# Patient Record
Sex: Female | Born: 1960
Health system: Southern US, Community
[De-identification: ages and names within clinical notes are randomized; demographics above are authoritative.]

## PROBLEM LIST (undated history)

## (undated) DIAGNOSIS — H8110 Benign paroxysmal vertigo, unspecified ear: Secondary | ICD-10-CM

## (undated) DIAGNOSIS — E785 Hyperlipidemia, unspecified: Secondary | ICD-10-CM

## (undated) DIAGNOSIS — M545 Low back pain, unspecified: Secondary | ICD-10-CM

## (undated) DIAGNOSIS — R51 Headache: Secondary | ICD-10-CM

## (undated) DIAGNOSIS — K3532 Acute appendicitis with perforation and localized peritonitis, without abscess: Secondary | ICD-10-CM

## (undated) DIAGNOSIS — A770 Spotted fever due to Rickettsia rickettsii: Secondary | ICD-10-CM

## (undated) DIAGNOSIS — N182 Chronic kidney disease, stage 2 (mild): Secondary | ICD-10-CM

## (undated) DIAGNOSIS — G43909 Migraine, unspecified, not intractable, without status migrainosus: Secondary | ICD-10-CM

## (undated) DIAGNOSIS — F329 Major depressive disorder, single episode, unspecified: Secondary | ICD-10-CM

## (undated) DIAGNOSIS — M533 Sacrococcygeal disorders, not elsewhere classified: Secondary | ICD-10-CM

## (undated) DIAGNOSIS — G4733 Obstructive sleep apnea (adult) (pediatric): Secondary | ICD-10-CM

## (undated) DIAGNOSIS — F32A Depression, unspecified: Secondary | ICD-10-CM

## (undated) DIAGNOSIS — E119 Type 2 diabetes mellitus without complications: Secondary | ICD-10-CM

## (undated) DIAGNOSIS — G8929 Other chronic pain: Secondary | ICD-10-CM

## (undated) DIAGNOSIS — F17200 Nicotine dependence, unspecified, uncomplicated: Secondary | ICD-10-CM

## (undated) DIAGNOSIS — K7689 Other specified diseases of liver: Secondary | ICD-10-CM

## (undated) DIAGNOSIS — R519 Headache, unspecified: Secondary | ICD-10-CM

## (undated) DIAGNOSIS — Z86010 Personal history of colonic polyps: Secondary | ICD-10-CM

## (undated) HISTORY — DX: Other chronic pain: G89.29

## (undated) HISTORY — DX: Type 2 diabetes mellitus without complications: E11.9

## (undated) HISTORY — DX: Headache: R51

## (undated) HISTORY — DX: Hyperlipidemia, unspecified: E78.5

## (undated) HISTORY — DX: Low back pain, unspecified: M54.50

## (undated) HISTORY — DX: Major depressive disorder, single episode, unspecified: F32.9

## (undated) HISTORY — PX: TEMPOROMANDIBULAR JOINT SURGERY: SHX35

## (undated) HISTORY — PX: TEMPOROMANDIBULAR JOINT ARTHROPLASTY: SUR76

## (undated) HISTORY — DX: Nicotine dependence, unspecified, uncomplicated: F17.200

## (undated) HISTORY — DX: Depression, unspecified: F32.A

## (undated) HISTORY — DX: Headache, unspecified: R51.9

## (undated) HISTORY — DX: Personal history of colonic polyps: Z86.010

## (undated) HISTORY — PX: COLONOSCOPY W/ BIOPSIES: SHX1374

## (undated) HISTORY — DX: Migraine, unspecified, not intractable, without status migrainosus: G43.909

## (undated) HISTORY — DX: Chronic kidney disease, stage 2 (mild): N18.2

## (undated) HISTORY — PX: OTHER SURGICAL HISTORY: SHX169

## (undated) HISTORY — PX: TONSILLECTOMY: SUR1361

## (undated) HISTORY — DX: Spotted fever due to Rickettsia rickettsii: A77.0

## (undated) HISTORY — DX: Benign paroxysmal vertigo, unspecified ear: H81.10

---

## 1998-06-06 ENCOUNTER — Other Ambulatory Visit: Admission: RE | Admit: 1998-06-06 | Discharge: 1998-06-06 | Payer: Self-pay | Admitting: Obstetrics

## 1998-09-06 ENCOUNTER — Emergency Department (HOSPITAL_COMMUNITY): Admission: EM | Admit: 1998-09-06 | Discharge: 1998-09-06 | Payer: Self-pay | Admitting: Emergency Medicine

## 1998-09-20 ENCOUNTER — Encounter: Payer: Self-pay | Admitting: Obstetrics

## 1998-09-20 ENCOUNTER — Inpatient Hospital Stay (HOSPITAL_COMMUNITY): Admission: RE | Admit: 1998-09-20 | Discharge: 1998-09-20 | Payer: Self-pay | Admitting: Obstetrics

## 1999-06-06 ENCOUNTER — Ambulatory Visit (HOSPITAL_COMMUNITY): Admission: RE | Admit: 1999-06-06 | Discharge: 1999-06-06 | Payer: Self-pay

## 2000-05-31 ENCOUNTER — Other Ambulatory Visit: Admission: RE | Admit: 2000-05-31 | Discharge: 2000-05-31 | Payer: Self-pay | Admitting: Obstetrics

## 2000-06-10 ENCOUNTER — Encounter: Payer: Self-pay | Admitting: Obstetrics

## 2000-06-10 ENCOUNTER — Ambulatory Visit (HOSPITAL_COMMUNITY): Admission: RE | Admit: 2000-06-10 | Discharge: 2000-06-10 | Payer: Self-pay | Admitting: Obstetrics

## 2000-08-06 ENCOUNTER — Emergency Department (HOSPITAL_COMMUNITY): Admission: EM | Admit: 2000-08-06 | Discharge: 2000-08-06 | Payer: Self-pay | Admitting: Emergency Medicine

## 2002-07-24 ENCOUNTER — Other Ambulatory Visit: Admission: RE | Admit: 2002-07-24 | Discharge: 2002-07-24 | Payer: Self-pay | Admitting: Family Medicine

## 2004-01-25 ENCOUNTER — Encounter: Admission: RE | Admit: 2004-01-25 | Discharge: 2004-01-25 | Payer: Self-pay | Admitting: Family Medicine

## 2007-11-10 HISTORY — PX: LAPAROSCOPIC APPENDECTOMY: SUR753

## 2008-10-30 ENCOUNTER — Inpatient Hospital Stay (HOSPITAL_COMMUNITY): Admission: EM | Admit: 2008-10-30 | Discharge: 2008-11-01 | Payer: Self-pay | Admitting: Emergency Medicine

## 2008-10-30 ENCOUNTER — Encounter: Admission: RE | Admit: 2008-10-30 | Discharge: 2008-10-30 | Payer: Self-pay | Admitting: Family Medicine

## 2008-10-30 ENCOUNTER — Encounter (INDEPENDENT_AMBULATORY_CARE_PROVIDER_SITE_OTHER): Payer: Self-pay

## 2008-11-08 ENCOUNTER — Ambulatory Visit (HOSPITAL_COMMUNITY): Admission: RE | Admit: 2008-11-08 | Discharge: 2008-11-08 | Payer: Self-pay | Admitting: Family Medicine

## 2008-11-08 ENCOUNTER — Encounter: Admission: RE | Admit: 2008-11-08 | Discharge: 2008-11-08 | Payer: Self-pay | Admitting: Family Medicine

## 2008-11-09 HISTORY — PX: ABDOMINAL HYSTERECTOMY: SHX81

## 2008-12-04 ENCOUNTER — Encounter: Admission: RE | Admit: 2008-12-04 | Discharge: 2008-12-04 | Payer: Self-pay | Admitting: Surgery

## 2008-12-11 ENCOUNTER — Observation Stay (HOSPITAL_COMMUNITY): Admission: AD | Admit: 2008-12-11 | Discharge: 2008-12-12 | Payer: Self-pay | Admitting: Specialist

## 2008-12-26 ENCOUNTER — Encounter (INDEPENDENT_AMBULATORY_CARE_PROVIDER_SITE_OTHER): Payer: Self-pay | Admitting: Obstetrics and Gynecology

## 2008-12-26 ENCOUNTER — Inpatient Hospital Stay (HOSPITAL_COMMUNITY): Admission: RE | Admit: 2008-12-26 | Discharge: 2008-12-28 | Payer: Self-pay | Admitting: Obstetrics and Gynecology

## 2009-10-26 ENCOUNTER — Encounter (INDEPENDENT_AMBULATORY_CARE_PROVIDER_SITE_OTHER): Payer: Self-pay | Admitting: *Deleted

## 2009-10-26 ENCOUNTER — Emergency Department (HOSPITAL_COMMUNITY): Admission: EM | Admit: 2009-10-26 | Discharge: 2009-10-26 | Payer: Self-pay | Admitting: Emergency Medicine

## 2009-10-30 ENCOUNTER — Encounter (INDEPENDENT_AMBULATORY_CARE_PROVIDER_SITE_OTHER): Payer: Self-pay | Admitting: *Deleted

## 2009-10-30 ENCOUNTER — Encounter: Admission: RE | Admit: 2009-10-30 | Discharge: 2009-10-30 | Payer: Self-pay | Admitting: Surgery

## 2009-11-09 HISTORY — PX: ESOPHAGOGASTRODUODENOSCOPY: SHX1529

## 2009-11-26 ENCOUNTER — Ambulatory Visit: Payer: Self-pay | Admitting: Gastroenterology

## 2009-11-26 DIAGNOSIS — K589 Irritable bowel syndrome without diarrhea: Secondary | ICD-10-CM | POA: Insufficient documentation

## 2009-11-26 DIAGNOSIS — R112 Nausea with vomiting, unspecified: Secondary | ICD-10-CM | POA: Insufficient documentation

## 2009-11-26 LAB — CONVERTED CEMR LAB: Tissue Transglutaminase Ab, IgA: 0.6 units (ref <7)

## 2009-11-27 LAB — CONVERTED CEMR LAB
ALT: 56 units/L — ABNORMAL HIGH (ref 0–35)
AST: 39 units/L — ABNORMAL HIGH (ref 0–37)
Albumin: 4.3 g/dL (ref 3.5–5.2)
Alkaline Phosphatase: 72 units/L (ref 39–117)
BUN: 11 mg/dL (ref 6–23)
Basophils Absolute: 0 10*3/uL (ref 0.0–0.1)
Basophils Relative: 0.3 % (ref 0.0–3.0)
CO2: 29 meq/L (ref 19–32)
Calcium: 10.2 mg/dL (ref 8.4–10.5)
Chloride: 105 meq/L (ref 96–112)
Creatinine, Ser: 0.9 mg/dL (ref 0.4–1.2)
Eosinophils Absolute: 0.2 10*3/uL (ref 0.0–0.7)
Eosinophils Relative: 1.7 % (ref 0.0–5.0)
Ferritin: 137.5 ng/mL (ref 10.0–291.0)
GFR calc non Af Amer: 70.49 mL/min (ref >60)
Glucose, Bld: 100 mg/dL — ABNORMAL HIGH (ref 70–99)
HCT: 41.1 % (ref 36.0–46.0)
Hemoglobin: 13.6 g/dL (ref 12.0–15.0)
IgA: 206 mg/dL (ref 68–378)
Iron: 94 ug/dL (ref 42–145)
Lymphocytes Relative: 30.5 % (ref 12.0–46.0)
Lymphs Abs: 2.8 10*3/uL (ref 0.7–4.0)
MCHC: 33.1 g/dL (ref 30.0–36.0)
MCV: 97.3 fL (ref 78.0–100.0)
Monocytes Absolute: 0.5 10*3/uL (ref 0.1–1.0)
Monocytes Relative: 5.5 % (ref 3.0–12.0)
Neutro Abs: 5.6 10*3/uL (ref 1.4–7.7)
Neutrophils Relative %: 62 % (ref 43.0–77.0)
Platelets: 261 10*3/uL (ref 150.0–400.0)
Potassium: 4.6 meq/L (ref 3.5–5.1)
RBC: 4.22 M/uL (ref 3.87–5.11)
RDW: 12.8 % (ref 11.5–14.6)
Saturation Ratios: 24.7 % (ref 20.0–50.0)
Sed Rate: 41 mm/hr — ABNORMAL HIGH (ref 0–22)
Sodium: 139 meq/L (ref 135–145)
TSH: 0.85 microintl units/mL (ref 0.35–5.50)
Total Bilirubin: 0.6 mg/dL (ref 0.3–1.2)
Total Protein: 7.6 g/dL (ref 6.0–8.3)
Transferrin: 272.2 mg/dL (ref 212.0–360.0)
WBC: 9.1 10*3/uL (ref 4.5–10.5)

## 2009-12-03 ENCOUNTER — Telehealth: Payer: Self-pay | Admitting: Gastroenterology

## 2009-12-05 ENCOUNTER — Ambulatory Visit: Payer: Self-pay | Admitting: Gastroenterology

## 2009-12-05 ENCOUNTER — Ambulatory Visit (HOSPITAL_COMMUNITY): Admission: RE | Admit: 2009-12-05 | Discharge: 2009-12-05 | Payer: Self-pay | Admitting: Gastroenterology

## 2009-12-05 ENCOUNTER — Telehealth (INDEPENDENT_AMBULATORY_CARE_PROVIDER_SITE_OTHER): Payer: Self-pay | Admitting: *Deleted

## 2009-12-09 ENCOUNTER — Ambulatory Visit: Payer: Self-pay | Admitting: Gastroenterology

## 2009-12-10 ENCOUNTER — Encounter: Payer: Self-pay | Admitting: Gastroenterology

## 2009-12-10 LAB — CONVERTED CEMR LAB
ALT: 47 units/L — ABNORMAL HIGH (ref 0–35)
AST: 33 units/L (ref 0–37)
Albumin: 4 g/dL (ref 3.5–5.2)
Alkaline Phosphatase: 63 units/L (ref 39–117)
BUN: 13 mg/dL (ref 6–23)
Basophils Absolute: 0 10*3/uL (ref 0.0–0.1)
Basophils Relative: 0.3 % (ref 0.0–3.0)
CO2: 29 meq/L (ref 19–32)
Calcium: 9.4 mg/dL (ref 8.4–10.5)
Chloride: 103 meq/L (ref 96–112)
Creatinine, Ser: 0.9 mg/dL (ref 0.4–1.2)
Eosinophils Absolute: 0.1 10*3/uL (ref 0.0–0.7)
Eosinophils Relative: 1.9 % (ref 0.0–5.0)
Ferritin: 109.8 ng/mL (ref 10.0–291.0)
GFR calc non Af Amer: 70.48 mL/min (ref >60)
Glucose, Bld: 171 mg/dL — ABNORMAL HIGH (ref 70–99)
HCT: 39.6 % (ref 36.0–46.0)
Hemoglobin: 13.7 g/dL (ref 12.0–15.0)
INR: 1 (ref 0.8–1.0)
IgA: 218 mg/dL (ref 68–378)
Iron: 101 ug/dL (ref 42–145)
Lymphocytes Relative: 41.4 % (ref 12.0–46.0)
Lymphs Abs: 3.1 10*3/uL (ref 0.7–4.0)
MCHC: 34.7 g/dL (ref 30.0–36.0)
MCV: 95.3 fL (ref 78.0–100.0)
Monocytes Absolute: 0.3 10*3/uL (ref 0.1–1.0)
Monocytes Relative: 3.5 % (ref 3.0–12.0)
Neutro Abs: 4 10*3/uL (ref 1.4–7.7)
Neutrophils Relative %: 52.9 % (ref 43.0–77.0)
Platelets: 237 10*3/uL (ref 150.0–400.0)
Potassium: 3.9 meq/L (ref 3.5–5.1)
Prothrombin Time: 10.2 s (ref 9.1–11.7)
RBC: 4.15 M/uL (ref 3.87–5.11)
RDW: 12.2 % (ref 11.5–14.6)
Saturation Ratios: 26.9 % (ref 20.0–50.0)
Sodium: 139 meq/L (ref 135–145)
Total Bilirubin: 0.2 mg/dL — ABNORMAL LOW (ref 0.3–1.2)
Total Protein: 7.2 g/dL (ref 6.0–8.3)
Transferrin: 268.1 mg/dL (ref 212.0–360.0)
WBC: 7.5 10*3/uL (ref 4.5–10.5)

## 2009-12-18 LAB — CONVERTED CEMR LAB
A-1 Antitrypsin, Ser: 128 mg/dL (ref 83–200)
Anti Nuclear Antibody(ANA): NEGATIVE
Ceruloplasmin: 38 mg/dL (ref 21–63)
HCV Ab: NEGATIVE
Hep A IgM: NEGATIVE
Hep A Total Ab: NEGATIVE
Hep B S Ab: NEGATIVE
Hepatitis B Surface Ag: NEGATIVE
Tissue Transglutaminase Ab, IgA: 0.5 units (ref <7)

## 2010-03-19 ENCOUNTER — Telehealth: Payer: Self-pay | Admitting: Gastroenterology

## 2010-03-21 ENCOUNTER — Ambulatory Visit: Payer: Self-pay | Admitting: Gastroenterology

## 2010-03-21 DIAGNOSIS — K7581 Nonalcoholic steatohepatitis (NASH): Secondary | ICD-10-CM | POA: Insufficient documentation

## 2010-03-21 DIAGNOSIS — K7689 Other specified diseases of liver: Secondary | ICD-10-CM

## 2010-03-21 HISTORY — DX: Other specified diseases of liver: K76.89

## 2010-03-24 ENCOUNTER — Telehealth (INDEPENDENT_AMBULATORY_CARE_PROVIDER_SITE_OTHER): Payer: Self-pay | Admitting: *Deleted

## 2010-04-14 ENCOUNTER — Telehealth (INDEPENDENT_AMBULATORY_CARE_PROVIDER_SITE_OTHER): Payer: Self-pay | Admitting: *Deleted

## 2010-07-18 ENCOUNTER — Encounter: Admission: RE | Admit: 2010-07-18 | Discharge: 2010-07-18 | Payer: Self-pay | Admitting: Family Medicine

## 2010-11-30 ENCOUNTER — Encounter: Payer: Self-pay | Admitting: Family Medicine

## 2010-11-30 ENCOUNTER — Encounter: Payer: Self-pay | Admitting: Gastroenterology

## 2010-12-11 NOTE — Progress Notes (Signed)
Summary: CX CT  Phone Note Call from Patient   Caller: Patient Summary of Call: Pt calling to cx the CT scan she had scheduled because her insurance deductible is $5000.00.  She says she feels fine today and will call if she has any further problems.  [facility] is aware pt called them and cx Initial call taken by: Christian Mate CMA Deborra Medina),  Mar 24, 2010 9:58 AM  Follow-up for Phone Call        ok Follow-up by: Milus Banister MD,  Mar 24, 2010 10:17 AM

## 2010-12-11 NOTE — Procedures (Signed)
Summary: Upper Endoscopy  Patient: [name] Note: All result statuses are Final unless otherwise noted.  Tests: (1) Upper Endoscopy (EGD)   EGD Upper Endoscopy       DONE     [facility]     [place], [place]  [address]           ENDOSCOPY PROCEDURE REPORT           PATIENT:  Monique, Cannon  MR#:  [id number]     BIRTHDATE:  May 12, 1961, 48 yrs. old  GENDER:  female     ENDOSCOPIST:  Milus Banister, MD     Referred by:  Michael Boston, M.D.     PROCEDURE DATE:  12/05/2009     PROCEDURE:  EGD with biopsy     ASA CLASS:  Class II     INDICATIONS:  nausea, vomitting     MEDICATIONS:  MAC sedation, administered by CRNA     TOPICAL ANESTHETIC:  none     DESCRIPTION OF PROCEDURE:   After the risks benefits and     alternatives of the procedure were thoroughly explained, informed     consent was obtained.  The  endoscope was introduced through the     mouth and advanced to the second portion of the duodenum, without     limitations.  The instrument was slowly withdrawn as the mucosa     was fully examined.     <<PROCEDUREIMAGES>>     There was mild, non-specific gastritis and bulbar duodenitis. The     distal stomach was biopsied to check for H. pylori (see image2 and     image3).  Otherwise the examination was normal (see image4,     image5, and image6).    Retroflexed views revealed no     abnormalities.    The scope was then withdrawn from the patient     and the procedure completed.           COMPLICATIONS:  None           ENDOSCOPIC IMPRESSION:     1) Mild gastritis and duodenitis, biopsies taken to check for H.     pylori     2) Otherwise normal examination           RECOMMENDATIONS:     If biopsies show H. pylori, she will be started on the     appropriate antibiotics.     If biopsies are negative, then my office will arrange HIDA scan     (she has nausea, perhaps from biliary dyskenesia given otherwise     negative workup).        ______________________________     Milus Banister, MD           n.     eSIGNED:   Milus Banister at 12/05/2009 09:11 AM           Thivierge, Sharyn Lull, [phone]  Note: An exclamation mark (!) indicates a result that was not dispersed into the flowsheet. Document Creation Date: 12/05/2009 9:12 AM _______________________________________________________________________  (1) Order result status: Final Collection or observation date-time: 12/05/2009 08:47 Requested date-time:  Receipt date-time:  Reported date-time:  Referring Physician:   Ordering Physician: Owens Loffler [phone]) Specimen Source:  Source: Tawanna Cooler Order Number: [id number] Lab site:

## 2010-12-11 NOTE — Progress Notes (Signed)
Summary: Labs  Phone Note Outgoing Call Call back at [facility] Phone [phone]   Call placed by: Christian Mate CMA Deborra Medina),  December 05, 2009 9:32 AM Summary of Call: call pt tomorrow and have her come in for labs Initial call taken by: Christian Mate CMA Deborra Medina),  December 05, 2009 9:33 AM  Follow-up for Phone Call        pt aware will have labs done Follow-up by: Christian Mate CMA Deborra Medina),  December 06, 2009 10:18 AM

## 2010-12-11 NOTE — Assessment & Plan Note (Signed)
History of Present Illness Visit Type: Initial Consult Primary GI MD: Owens Loffler MD Primary Provider: n/a Requesting Provider: Michael Boston, MD Chief Complaint: left sided  abdominal pain, n&v History of Present Illness:     who was sent by Dr. Johney Maine.  One month ago she was told she had gallbladder issues, went to Dr. Johney Maine.  She had appendectomy one year ago in [place] (10/2008).  Was in post operative pain, CT done and was told she had a cyst in the uterous.  She lap hysterectomy in Feb 2010, was told she had a lot of adhesions and her bladder was abnormal.    She has gained 15 pounds in past 2 months.  She has left lower quadrant, this constant "10" per patient without pain medicines. This pain was going UP her back, saw a chirapracter, had accupunture  (mid december) and this signifcantly  helped the left sided pain.  The accupuncture would help for about a week.    She has been also vomitting daily for about 2 months unless she has antinausea medicines.    Nausea not clearly related to eating.  lately taking 3-4 oxycodone a day for the past week, accupuncture helped prior to that.  she was in the emergency room last month. CT Scan abdomen and pelvis was essentially normal except for some extra stool in the rectosigmoid region. No clear obstruction. She had lab tests as well within normal CBC, normal complete metabolic profile except for slightly elevated ALT.           Current Medications (verified): 1)  Oxycodone-Acetaminophen 5-325 Mg Tabs (Oxycodone-Acetaminophen) .... Take 1 Tablet Every 6 Hours As Needed  Allergies (verified): 1)  ! Augmentin  Past History:  Past Medical History: uterine fibroids  hypotension gallstones pneumonia  Past Surgical History: Appendectomy 2009 Diagnostic lap 2010; Hysterectomy; Bilateral Salpingo-oophorectomy  Family History: no colon cancer  Social History: she is married, she has no children, she smokes cigarettes currently,  she does not drink alcohol, she drinks 3 caffeinated beverages a day  Review of Systems       Pertinent positive and negative review of systems were noted in the above HPI and GI specific review of systems.  All other review of systems was otherwise negative.   Vital Signs:  Patient profile:   50 year old female Height:      65 inches Weight:      164.50 pounds BMI:     27.47 Pulse rate:   72 / minute Pulse rhythm:   regular BP sitting:   102 / 66  (left arm) Cuff size:   regular  Vitals Entered By: June McMurray Morris Deborra Medina) (November 26, 2009 10:54 AM)  Physical Exam  Additional Exam:  Constitutional: generally well appearing Psychiatric: alert and oriented times 3 Eyes: extraocular movements intact Mouth: oropharynx moist, no lesions Neck: supple, no lymphadenopathy Cardiovascular: heart regular rate and rythm Lungs: CTA bilaterally Abdomen: soft, non-tender, non-distended, no obvious ascites, no peritoneal signs, normal bowel sounds Extremities: no lower extremity edema bilaterally Skin: no lesions on visible extremities back examination: nontender throughout spinal column  throughout the visit she was in tears, fairly emotional.    Impression & Recommendations:  Problem # 1:  left flank, left back, left hip pains he did not seem that she has left sided abdominal pains. Her pains are really more in the flank, back, left hip region. She is very clear that acupuncture completely improved her pains. She did have somewhat excessive  stool burden on a CT scan done last month and so perhaps constipation is playing a role here. She has not felt constipated until she began taking narcotic pain medicines last week. I recommended she continue taking MiraLax twice a day.  explained to her that I did not think that her pains were likely related to her GI tract that I would probably not be able to help her with these pains.  Problem # 2:  constipation, routine risk for colon cancer we  will arrange for colonoscopy to be done at the same time as her upcoming EGD  Problem # 3:  nausea, vomiting nearly daily no obvious cause. She does have gallstones however except for nausea she really has no symptoms could be attributed to the gallstones and is not clear that the gallstones are causing her nausea either. Think she needs EGD at her soonest convenience. The endoscopic procedure should be done with propofol at [facility].   She will also get a set of labs including a CBC, complete metabolic profile, thyroid testing, sedimentation rate, celiac sprue testing.  Other Orders: TLB-CMP (Comprehensive Metabolic Pnl) (96759-FMBW) TLB-CBC Platelet - w/Differential (85025-CBCD) TLB-TSH (Thyroid Stimulating Hormone) (84443-TSH) TLB-Sedimentation Rate (ESR) (85652-ESR) TLB-IgA (Immunoglobulin A) (82784-IGA) T-Tissue Transglutamase Ab IgA (46659-93570) TLB-Iron, (Fe) Total (83540-FE) TLB-IBC Pnl (Iron/FE;Transferrin) (83550-IBC) TLB-Ferritin (82728-FER)  Patient Instructions: 1)  You will be scheduled to have an upper endoscopy. 2)  You will be scheduled to have a colonoscopy. 3)  These will be done with anethesia assistence, propofol. 4)  Will call in prescription for phenergan suppository, take as needed for nausea, vomitting. 5)  A copy of this information will be sent to Dr. Johney Maine. 6)  You will get lab test(s) done today (cmet, cbc, tsh, esr, tTg, total IgA, iron level, TIBC, ferritin). 7)  You should continue taking miralax twice a day. 8)  The medication list was reviewed and reconciled.  All changed / newly prescribed medications were explained.  A complete medication list was provided to the patient / caregiver. Prescriptions: PROMETHAZINE HCL 25 MG  SUPP (PROMETHAZINE HCL) take one rectall as needed, once to twice a day  #60 x 2   Entered and Authorized by:   Milus Banister MD   Signed by:   Christian Mate CMA ([place]) on 11/26/2009   Method used:   [facility]          [facility] [address]       [address]       [place], [place]  [address]       Ph: [phone] or [phone]       Fax: [phone]   RxID:   [id number]   Appended Document: Orders UpdateMovi    Clinical Lists Changes  Problems: Added new problem of NAUSEA AND VOMITING (ICD-787.01) Orders: Added new Test order of [facility] - Signed      Appended Document: movi    Clinical Lists Changes  Medications: Added new medication of MOVIPREP 100 GM  SOLR (PEG-KCL-NACL-NASULF-NA ASC-C) As per prep instructions. - Signed Rx of MOVIPREP 100 GM  SOLR (PEG-KCL-NACL-NASULF-NA ASC-C) As per prep instructions.;  #1 x 0;  Signed;  Entered by: Christian Mate CMA (AAMA);  Authorized by: Milus Banister MD;  Method used: [facility]  [id number] [address], [place], [place]  [address], Ph: [phone] or [phone], Fax: [phone]    Prescriptions: MOVIPREP 100 GM  SOLR (PEG-KCL-NACL-NASULF-NA ASC-C) As per prep instructions.  #1 x 0   Entered by:   Christian Mate CMA ([place])   Authorized by:   Milus Banister MD   Signed by:   Christian Mate CMA ([place]) on 11/26/2009   Method used:   [facility]        [facility]  [id number]       [address]       [place], [place]  [address]       Ph: [phone] or [phone]       Fax: [phone]   RxID:   [id number]

## 2010-12-11 NOTE — Progress Notes (Signed)
Summary: Hosp procedure-lost instructions  Phone Note Call from Patient Call back at [facility] Phone [phone]   Call For: Dr [name] Summary of Call: Lost her prep instructions for a procedure this thursday Can we Fax them to her at [phone] ? Initial call taken by: Irwin Brakeman [facility],  December 03, 2009 12:49 PM  Follow-up for Phone Call        instructions faxed to the above number.  pt aware Follow-up by: Christian Mate CMA Deborra Medina),  December 03, 2009 1:06 PM

## 2010-12-11 NOTE — Letter (Signed)
Summary: [facility] Instructions  [facility]  [place], [place] [address]   Phone: [phone]  Fax: [phone]       [name]    1961/01/22    MRN: [id number]        Procedure Day /Date:12/05/09     Arrival Time:630 am     Procedure Time:830 am     Location of Procedure:                     X  [facility] ( Outpatient Registration)                        [place]   Starting 5 days prior to your procedure 11/30/09 do not eat nuts, seeds, popcorn, corn, beans, peas,  salads, or any raw vegetables.  Do not take any fiber supplements (e.g. Metamucil, Citrucel, and Benefiber).  THE DAY BEFORE YOUR PROCEDURE         DATE: 12/04/09 DAY: WED  1.  Drink clear liquids the entire day-NO SOLID FOOD  2.  Do not drink anything colored red or purple.  Avoid juices with pulp.  No orange juice.  3.  Drink at least 64 oz. (8 glasses) of fluid/clear liquids during the day to prevent dehydration and help the prep work efficiently.  CLEAR LIQUIDS INCLUDE: Water Jello Ice Popsicles Tea (sugar ok, no milk/cream) Powdered fruit flavored drinks Coffee (sugar ok, no milk/cream) Gatorade Juice: apple, white grape, white cranberry  Lemonade Clear bullion, consomm, broth Carbonated beverages (any kind) Strained chicken noodle soup Hard Candy                             4.  In the morning, mix first dose of MoviPrep solution:    Empty 1 Pouch A and 1 Pouch B into the disposable container    Add lukewarm drinking water to the top line of the container. Mix to dissolve    Refrigerate (mixed solution should be used within 24 hrs)  5.  Begin drinking the prep at 5:00 p.m. The MoviPrep container is divided by 4 marks.   Every 15 minutes drink the solution down to the next mark (approximately 8 oz) until the full liter is complete.   6.  Follow completed prep with 16 oz of clear liquid of your choice (Nothing red or  purple).  Continue to drink clear liquids until bedtime.  7.  Before going to bed, mix second dose of MoviPrep solution:    Empty 1 Pouch A and 1 Pouch B into the disposable container    Add lukewarm drinking water to the top line of the container. Mix to dissolve    Refrigerate  THE DAY OF YOUR PROCEDURE      DATE: 12/05/09 JKD:TOIZT  Beginning at 330 a.m. (5 hours before procedure):         1. Every 15 minutes, drink the solution down to the next mark (approx 8 oz) until the full liter is complete.  2. Follow completed prep with 16 oz. of clear liquid of your choice.    3. You may drink clear liquids until 430 am (4 HOURS BEFORE PROCEDURE).   MEDICATION INSTRUCTIONS  Unless otherwise instructed, you should take regular prescription medications with a small sip of water   as early as possible the morning of your procedure.  OTHER INSTRUCTIONS  You will need a responsible adult at least 51 years of age to accompany you and drive you home.   This person must remain in the waiting room during your procedure.  Wear loose fitting clothing that is easily removed.  Leave jewelry and other valuables at home.  However, you may wish to bring a book to read or  an iPod/MP3 player to listen to music as you wait for your procedure to start.  Remove all body piercing jewelry and leave at home.  Total time from sign-in until discharge is approximately 2-3 hours.  You should go home directly after your procedure and rest.  You can resume normal activities the  day after your procedure.  The day of your procedure you should not:   Drive   Make legal decisions   Operate machinery   Drink alcohol   Return to work  You will receive specific instructions about eating, activities and medications before you leave.    The above instructions have been reviewed and explained to me by   _______________________    I fully understand and can verbalize these  instructions _____________________________ Date _________

## 2010-12-11 NOTE — Procedures (Signed)
Summary: Instructions for procedure/[facility] (out pt)  Instructions for procedure/[facility] (out pt)   Imported By: Phillis Knack 12/03/2009 11:44:50  _____________________________________________________________________  External Attachment:    Type:   Image     Comment:   External Document

## 2010-12-11 NOTE — Progress Notes (Signed)
Summary: lab reminder  Phone Note Outgoing Call Call back at [facility] Phone [phone]   Call placed by: Christian Mate CMA Deborra Medina),  April 14, 2010 4:04 PM Summary of Call: pt called and reminded to have labs Initial call taken by: Christian Mate CMA Deborra Medina),  April 14, 2010 4:04 PM

## 2010-12-11 NOTE — Assessment & Plan Note (Signed)
  Review of gastrointestinal problems: 1. Ruptured appendicitis, 2010. 2. Elevated liver tests, mildly elevated transaminases (noted winter 2010,11): Lab tests 2011: hepatitis A., B., C. all negative. Ceruloplasmin, alpha-1 antitrypsin, ANA, AMA, iron studies all normal. Imaging including CT and ultrasound had suggested fatty liver however no signs of cirrhosis.    History of Present Illness Visit Type: Follow-up Visit Primary GI MD: Owens Loffler MD Primary Provider: [facility] Requesting Provider: n/a Chief Complaint: follow up liver test History of Present Illness:      liver disease run in her family.  hemochromatosis, liver cancer, cirrhosis.  she continues to have left back, left flank pains.  these are of unclear etiology. She has seen a Restaurant manager, fast food and acupuncturist for them. She has not seen back surgeon or a spine doctor however.  She's had new RLQ pains for past 3 weeks.  Had pretty severe distention at the beginning of it, she felt some rip in RLQ.  NO fevers or chills.           Current Medications (verified): 1)  None  Allergies (verified): 1)  ! Augmentin  Vital Signs:  Patient profile:   50 year old female Height:      65 inches Weight:      173 pounds BMI:     28.89 BSA:     1.86 Pulse rate:   68 / minute Pulse rhythm:   regular BP sitting:   104 / 64  (left arm)  Vitals Entered By: Genella Mech CMA Deborra Medina) (Mar 21, 2010 8:39 AM)  Physical Exam  Additional Exam:  Constitutional: generally well appearing Psychiatric: alert and oriented times 3 Abdomen: soft, Mild to moderately tender in the right lower quadrant, non-distended, normal bowel sounds    Impression & Recommendations:  Problem # 1:  right lower quadrant pain she had a ruptured appendicitis about a year ago. She has had about 3 weeks of right lower quadrant pain. She is fairly tender to the exam on the right lower quadrant. She has had no fevers or chills. We will set  her up with a repeat CT scan abdomen and pelvis. Further recommendations pending those findings.  Problem # 2:  intermittent abdominal distention she is known to have adhesions, disease could be intermittent small bowel obstructions. Let's see what the CT scan shows.  Problem # 3:  abnormal weight gain she has gained 30-40 pounds in 6 months. I detect no extra fluid in her abdomen or her legs. He explained to her that she does not have cirrhosis on imaging or lab tests. I do think that her weight gain may be responsible for some of her abdominal, back complaints. She would not accept the fact that she may be too much for her body at this point.  Problem # 4:  left flank, left back pain I recommended she contact her primary care physician and be referred to a back doctor about this  Other Orders: CT Abdomen/Pelvis with Contrast (CT Abd/Pelvis w/con)  Patient Instructions: 1)  You will be scheduled for a CT scan of abdomen and pelvis with IV and oral contrast. 2)  You have fatty liver disease based on ultrasounds, liver tests, best treatment for this is gradual weight loss. 3)  The medication list was reviewed and reconciled.  All changed / newly prescribed medications were explained.  A complete medication list was provided to the patient / caregiver.

## 2010-12-11 NOTE — Letter (Signed)
Summary: Appt Reminder [facility]  [place], [place] [address]   Phone: [phone]  Fax: [phone]        December 05, 2009 MRN: [id number]    [name], [name]    Dear [name],   You have a return appointment with Dr. [name] on 12/30/09 at 3:30 pm.  Please remember to bring a complete list of the medicines you are taking, your insurance card and your co-pay.  If you have to cancel or reschedule this appointment, please call before 5:00 pm the evening before to avoid a cancellation fee.  If you have any questions or concerns, please call [phone].    Sincerely,    Christian Mate CMA ([place])  Appended Document: Appt Reminder 2 letter mailed

## 2010-12-11 NOTE — Letter (Signed)
Summary: Results Letter  [facility]  [place], [place] [address]   Phone: [phone]  Fax: [phone]        December 10, 2009 MRN: [id number]    [name] [place], [place]  [address]    Dear [name],   At least one of the polyps removed during your recent procedure was proven to be adenomatous.  These are pre-cancerous polyps that may have grown into cancers if they had not been removed.  Based on current nationally recognized surveillance guidelines, I recommend that you have a repeat colonoscopy in 5 years.  We will therefore put your information in our reminder system and will contact you in 5 years to schedule a repeat procedure.   The biopsies taken during the EGD were normal.  You should continue to follow the recommnedations that we discussed at the time of your procedure.  Please feel free to call if you have any further questions or concerns.        Sincerely,  Milus Banister MD  This letter has been electronically signed by your physician.  Appended Document: Results Letter letter mailed

## 2010-12-11 NOTE — Procedures (Signed)
Summary: Colonoscopy    COLONOSCOPY PROCEDURE REPORT PATIENT:  [name], [name]  MR#:  #[id number] BIRTHDATE:   1961-01-21, 48 yrs. old   GENDER:   female ENDOSCOPIST:   Milus Banister, MD Referred by: Michael Boston, M.D. PROCEDURE DATE:  12/05/2009 PROCEDURE:  Colonoscopy with snare polypectomy ASA CLASS:   Class II INDICATIONS: constipation, left sided abd (flank, back) pains MEDICATIONS:    MAC sedation, administered by CRNA DESCRIPTION OF PROCEDURE:   After the risks benefits and alternatives of the procedure were thoroughly explained, informed consent was obtained.  Digital rectal exam was performed and revealed no rectal masses.   The  endoscope was introduced through the anus and advanced to the terminal ileum which was intubated for a short distance, without limitations.  The quality of the prep was good, using MoviPrep.  The instrument was then slowly withdrawn as the colon was fully examined. <<PROCEDUREIMAGES>>        <<OLD IMAGES>> FINDINGS:  A diminutive polyp was found in the descending colon. This was sessile, 70m across, removed with cold snare and sent to pathology (see image5 and image6).  The terminal ileum appeared normal (see image3).  This was otherwise a normal examination of the colon (see image7 and image1).   Retroflexed views in the rectum revealed no abnormalities.    The scope was then withdrawn from the patient and the procedure completed. COMPLICATIONS:   None  ENDOSCOPIC IMPRESSION:  1) Diminutive polyp in the descending colon, removed and sent to pathology  2) Normal terminal ileum  3) Otherwise normal examination  RECOMMENDATIONS:  1) If the polyp(s) removed today are proven to be adenomatous (pre-cancerous) polyps, you will need a repeat colonoscopy in 5 years. Otherwise you should continue to follow colorectal cancer screening guidelines for "routine risk" patients with colonoscopy in 10 years. 2) Unclear etiology of her left sided pains.  These seem  more to be in flank, left back and not clearly GI related.  Perhaps constipation is playing somewhat of a role and so she will stay on miralax powder, two capfuls a day. 3) My office will call to arrange for further lab workup for elevated liver tests (transaminases both slightly elevated) as well as return office visit in 4-5 weeks.   _______________________________ DMilus Banister MD   Appended Document: Colonoscopy patty, she needs 1. Hepatitis A (IgM and IgG), Hepatitis B surface antigen, Hepatitis B surface antibody, Hepatitis C antibody, total iron, ferritin, TIBC, ANA, AMA, anti smooth muscle antibody, alpha 1 antitrypsin, cerulloplasm, TTG, total IgA level, CBC, CMET, INR. 2. ROV with me in 4-5 weeks  Appended Document: Colonoscopy labs in IDX and ROV made letter mailed  Appended Document: Colonoscopy/recall    Clinical Lists Changes  Observations: Added new observation of COLONNXTDUE: 11/2014 (12/10/2009 13:54)

## 2010-12-11 NOTE — Progress Notes (Signed)
Summary: Triage  Phone Note Call from Patient Call back at Home Phone [phone] Call back at Cell  [phone]   Caller: Patient Call For: Dr. [name] Reason for Call: Talk to Nurse Summary of Call: Pt said she is bloated and right lower side pain. Pt said she cannot wait until her appt. on 04-09-10 Initial call taken by: Webb Laws,  Mar 19, 2010 4:56 PM  Follow-up for Phone Call        left message on cell to call back [facility] Deborra Medina)  Mar 20, 2010 9:06 AM   unable to leave message on home number. Christian Mate CMA ([place])  Mar 20, 2010 9:08 AM   PT RETURNED CALL AND WAS OFFERED APPT FOR 03/21/10.  SHE ACCEPTED AND WILL CALL IF SHE CANT MAKE IT. Follow-up by: Christian Mate CMA Deborra Medina),  Mar 20, 2010 10:08 AM

## 2011-02-09 LAB — COMPREHENSIVE METABOLIC PANEL
ALT: 45 U/L — ABNORMAL HIGH (ref 0–35)
AST: 25 U/L (ref 0–37)
Albumin: 4 g/dL (ref 3.5–5.2)
Alkaline Phosphatase: 66 U/L (ref 39–117)
BUN: 13 mg/dL (ref 6–23)
CO2: 27 mEq/L (ref 19–32)
Calcium: 9.3 mg/dL (ref 8.4–10.5)
Chloride: 105 mEq/L (ref 96–112)
Creatinine, Ser: 0.78 mg/dL (ref 0.4–1.2)
GFR calc Af Amer: >60 mL/min (ref >60)
GFR calc non Af Amer: >60 mL/min (ref >60)
Glucose, Bld: 97 mg/dL (ref 70–99)
Potassium: 4.1 mEq/L (ref 3.5–5.1)
Sodium: 139 mEq/L (ref 135–145)
Total Bilirubin: 0.4 mg/dL (ref 0.3–1.2)
Total Protein: 7.1 g/dL (ref 6.0–8.3)

## 2011-02-09 LAB — DIFFERENTIAL
Basophils Absolute: 0.1 10*3/uL (ref 0.0–0.1)
Basophils Relative: 1 % (ref 0–1)
Eosinophils Absolute: 0.2 10*3/uL (ref 0.0–0.7)
Eosinophils Relative: 3 % (ref 0–5)
Lymphocytes Relative: 41 % (ref 12–46)
Lymphs Abs: 3.5 10*3/uL (ref 0.7–4.0)
Monocytes Absolute: 0.5 10*3/uL (ref 0.1–1.0)
Monocytes Relative: 5 % (ref 3–12)
Neutro Abs: 4.4 10*3/uL (ref 1.7–7.7)
Neutrophils Relative %: 51 % (ref 43–77)

## 2011-02-09 LAB — CBC
HCT: 40.4 % (ref 36.0–46.0)
Hemoglobin: 13.7 g/dL (ref 12.0–15.0)
MCHC: 34 g/dL (ref 30.0–36.0)
MCV: 93.8 fL (ref 78.0–100.0)
Platelets: 247 10*3/uL (ref 150–400)
RBC: 4.3 MIL/uL (ref 3.87–5.11)
RDW: 13.1 % (ref 11.5–15.5)
WBC: 8.6 10*3/uL (ref 4.0–10.5)

## 2011-02-09 LAB — URINALYSIS, ROUTINE W REFLEX MICROSCOPIC
Bilirubin Urine: NEGATIVE
Glucose, UA: NEGATIVE mg/dL
Hgb urine dipstick: NEGATIVE
Ketones, ur: NEGATIVE mg/dL
Nitrite: NEGATIVE
Protein, ur: NEGATIVE mg/dL
Specific Gravity, Urine: 1.021 (ref 1.005–1.030)
Urobilinogen, UA: 0.2 mg/dL (ref 0.0–1.0)
pH: 5.5 (ref 5.0–8.0)

## 2011-02-09 LAB — LIPASE, BLOOD: Lipase: 27 U/L (ref 11–59)

## 2011-02-24 LAB — CBC
HCT: 39.4 % (ref 36.0–46.0)
HCT: 32.5 % — ABNORMAL LOW (ref 36.0–46.0)
HCT: 41 % (ref 36.0–46.0)
Hemoglobin: 13.3 g/dL (ref 12.0–15.0)
Hemoglobin: 11.1 g/dL — ABNORMAL LOW (ref 12.0–15.0)
Hemoglobin: 13.9 g/dL (ref 12.0–15.0)
MCHC: 33.9 g/dL (ref 30.0–36.0)
MCHC: 34 g/dL (ref 30.0–36.0)
MCHC: 34.1 g/dL (ref 30.0–36.0)
MCV: 93.8 fL (ref 78.0–100.0)
MCV: 93.3 fL (ref 78.0–100.0)
MCV: 94.8 fL (ref 78.0–100.0)
Platelets: 310 10*3/uL (ref 150–400)
Platelets: 307 10*3/uL (ref 150–400)
Platelets: 377 10*3/uL (ref 150–400)
RBC: 4.2 MIL/uL (ref 3.87–5.11)
RBC: 3.42 MIL/uL — ABNORMAL LOW (ref 3.87–5.11)
RBC: 4.4 MIL/uL (ref 3.87–5.11)
RDW: 12.8 % (ref 11.5–15.5)
RDW: 12.9 % (ref 11.5–15.5)
RDW: 13.1 % (ref 11.5–15.5)
WBC: 13.4 10*3/uL — ABNORMAL HIGH (ref 4.0–10.5)
WBC: 11.4 10*3/uL — ABNORMAL HIGH (ref 4.0–10.5)
WBC: 16.2 10*3/uL — ABNORMAL HIGH (ref 4.0–10.5)

## 2011-02-24 LAB — DIFFERENTIAL
Basophils Absolute: 0 10*3/uL (ref 0.0–0.1)
Basophils Relative: 0 % (ref 0–1)
Eosinophils Absolute: 0.3 10*3/uL (ref 0.0–0.7)
Eosinophils Relative: 2 % (ref 0–5)
Lymphocytes Relative: 24 % (ref 12–46)
Lymphs Abs: 3.2 10*3/uL (ref 0.7–4.0)
Monocytes Absolute: 0.6 10*3/uL (ref 0.1–1.0)
Monocytes Relative: 4 % (ref 3–12)
Neutro Abs: 9.3 10*3/uL — ABNORMAL HIGH (ref 1.7–7.7)
Neutrophils Relative %: 70 % (ref 43–77)

## 2011-02-24 LAB — COMPREHENSIVE METABOLIC PANEL
ALT: 19 U/L (ref 0–35)
AST: 17 U/L (ref 0–37)
Albumin: 3.4 g/dL — ABNORMAL LOW (ref 3.5–5.2)
Alkaline Phosphatase: 65 U/L (ref 39–117)
BUN: 13 mg/dL (ref 6–23)
CO2: 29 mEq/L (ref 19–32)
Calcium: 9.1 mg/dL (ref 8.4–10.5)
Chloride: 101 mEq/L (ref 96–112)
Creatinine, Ser: 0.85 mg/dL (ref 0.4–1.2)
GFR calc Af Amer: >60 mL/min (ref >60)
GFR calc non Af Amer: >60 mL/min (ref >60)
Glucose, Bld: 100 mg/dL — ABNORMAL HIGH (ref 70–99)
Potassium: 4.5 mEq/L (ref 3.5–5.1)
Sodium: 136 mEq/L (ref 135–145)
Total Bilirubin: 0.4 mg/dL (ref 0.3–1.2)
Total Protein: 6.9 g/dL (ref 6.0–8.3)

## 2011-02-24 LAB — HCG, SERUM, QUALITATIVE: Preg, Serum: NEGATIVE

## 2011-03-24 NOTE — Op Note (Signed)
[name], [name]               ACCOUNT NO.:  [id number]   MEDICAL RECORD NO.:  [id number]          PATIENT TYPE:  INP   LOCATION:  9312                          FACILITY:  [facility]   PHYSICIAN:  Darlyn Chamber, M.D.   DATE OF BIRTH:  1961/05/15   DATE OF PROCEDURE:  12/26/2008  DATE OF DISCHARGE:                               OPERATIVE REPORT   PREOPERATIVE DIAGNOSIS:  Symptomatic uterine fibroids.   POSTOPERATIVE DIAGNOSES:  Symptomatic uterine fibroids with extensive  pelvic adhesions.   OPERATIVE PROCEDURE:  Attempted laparoscopy followed by exploratory  laparotomy with total abdominal hysterectomy and bilateral salpingo-  oophorectomy.  Cystoscopy.   SURGEON:  Darlyn Chamber, MD   ASSISTANT:  Ralene Bathe. Matthew Saras, MD   ANESTHESIA:  General endotracheal.   ESTIMATED BLOOD LOSS:  400 mL.   PACKS AND DRAINS:  None.   INTRAOPERATIVE BLOOD PLACED:  None.   COMPLICATIONS:  None.   INDICATIONS:  Dictated in the history and physical.   PROCEDURE IN DETAIL:  The patient was taken to the OR and placed in  supine position.  After satisfactory level of general endotracheal  anesthesia was obtained, the patient was placed in the dorsal supine  position using the Allen stirrups.  The abdomen, perineum, and vagina  were prepped out with antiseptic solution.  The bladder was emptied by  catheterization.  A Hulka tenaculum was put in place and secured.  The  patient then draped sterile field.  Subumbilical incision was made with  knife and carried through subcutaneous tissue.  The fascia entered  sharply and incision fashioned laterally.  We were unable to enter  through the peritoneum.  We were little concern that there were possible  omental adhesions up around the umbilicus.  This certainly was  thickened.  In view of this, we deferred laparoscopy and decided to  proceed with exploratory laparotomy.  Subumbilical fascia was closed  with figure-of-eight of 0 Vicryl and skin with  interrupted subcuticular  4-0 Vicryl.  Foley was placed to straight drain.  The Hulka tenaculum  was then removed.  The patient's legs were repositioned.  Low transverse  skin incision was made with knife and carried through subcutaneous  tissue.  Anterior rectus fascia was entered sharply and the incision in  the fascia was extended laterally.  Fascia taken off the muscles  superiorly and inferiorly.  Rectus muscles were separated in the  midline.  Peritoneum was entered.  Incision of peritoneum extended both  superiorly and inferiorly.  Palpation revealed a smooth liver.  There  were no periumbilical adhesions noted, so evidently were preperitoneal.  However, uterus was enlarged with fibroid.  There were adhesions to the  bladder flap and posteriorly.  An O'Connor-O'Sullivan retractor was put  in place.  The uterus was elevated through the incision.  The left round  ligament was clamped, cut, and suture ligated with 0 Vicryl.  The left  utero-ovarian pedicle was isolated, clamped, cut, and ligated with a  free tie of 0 Vicryl.  Left uterine vessels were skeletonized, clamped,  cut, and suture ligated with 0 Vicryl.  We went to the right side, the  right round ligament was clamped, cut, and suture ligated with 0 Vicryl.  The right utero-ovarian pedicle was developed, clamped, cut, and ligated  with a free tie of 0 Vicryl.  Bladder flap was then developed.  Right  uterine vessels were clamped, cut, and suture ligated with 0 Vicryl.  Using the clamp, cut, and tie technique with suture ligature of 0  Vicryl, the parametrium was separated from sides of the uterus down to  the lower uterine segment. At this point in time, the uterine fundus was  excised off the lower cervical stump and passed off.  It was sent to  Pathology.  Cervical stump was then grasped.  Using again clamped, cut,  and tie technique with suture ligature of 0 Vicryl, the parametrium was  separated from sides of lower  uterine stump.  Vaginal angles were  clamped and cut.  Intervening vaginal mucosa was excised and cervical  stump was passed off the operative field and sent to Pathology, held  pedicles, was secured with suture ligature of 0 Vicryl.  The intervening  vaginal mucosa was closed with interrupted figure-of-eights of 0 Vicryl.  Some oozing was noted from the left side at the vaginal angles.  This  was clamped and suture ligated with 0 Vicryl with good hemostasis.  At  this point in time, we went back to the ovaries.  First the left ovary  was elevated.  It was on a nice stalk well above the pelvic sidewall.  The ovarian vasculature was clamped, cut, and the left tube and ovary  were passed off the operative field and sent to Pathology.  The vascular  stump was secured with a free tie of 0 Vicryl with suture ligature of 0  Vicryl.  We then went to the right side.  Right ovary was elevated.  Again, it was on a long stalk away from the pelvic sidewall.  The  ovarian vascular was clamped and cut.  The tube and ovary were passed  off the operative field.  Pedicles secured with a free tie of 0 Vicryl  and suture ligature of 0 Vicryl.  We then irrigated the pelvis.  We had  fairly good hemostasis, some oozing from the vaginal cuff brought under  control with the Bovie.  Again, we irrigated and had good hemostasis.  The O'Connor-O'Sullivan retractor and all packs were removed.  Peritoneum was closed with a running suture of 3-0 Vicryl, fascia was  closed with a running suture of 0 PDS, and the skin was closed with  staples.   Foley was removed.  A cystoscope was put in place.  The patient had been  given indigo carmine.  Visualization of bladder on the right side, there  was a roughened and thickened area that I took a picture of.  This may  just be inflammatory changes from what has been going on.  We have the  urologist followup on this.  We did identify both ureteral orifices.  They did spilled  blue-tinged urine.  No other bladder pathology was  noted.  Cystoscope was removed.  Foley was placed back to straight  drain.  The patient was taken out of dorsal lithotomy position.  Once  alert and extubated, transferred to recovery room in good condition.  Sponge, instrument, and needle count  reported as correct by circulating  nurse x2.      Darlyn Chamber, M.D.  Electronically Signed     JSM/MEDQ  D:  12/26/2008  T:  12/26/2008  Job:  [id number]

## 2011-03-24 NOTE — Discharge Summary (Signed)
[name], [name]               ACCOUNT NO.:  [id number]   MEDICAL RECORD NO.:  [id number]          PATIENT TYPE:  INP   LOCATION:  9312                          FACILITY:  [facility]   PHYSICIAN:  Darlyn Chamber, M.D.   DATE OF BIRTH:  06-20-1961   DATE OF ADMISSION:  12/26/2008  DATE OF DISCHARGE:  12/28/2008                               DISCHARGE SUMMARY   ADMITTING DIAGNOSIS:  Symptomatic uterine fibroids.   DISCHARGE DIAGNOSIS:  Symptomatic uterine fibroids.   OPERATIVE PROCEDURE:  Diagnostic laparoscopy, total abdominal  hysterectomy, and bilateral salpingo-oophorectomy.  For complete history  and physical, please see dictated note.   HOSPITAL COURSE:  The patient underwent above-noted surgery.  Postop did  well.  She was discharged home on her second postop day.  At that time,  she was afebrile, stable vital signs.  She was tolerating her diet  fairly well.  Was ambulating without difficulty.  Abdomen was soft.  Bowel sounds were active.  She had not passed flatus.  Incisions were  clear.  She was having no active vaginal bleeding, was voiding without  difficulty.  Postop hemoglobin was less 11.1.   In terms of complication and account stay in the hospital, the patient  discharged home in stable condition.   DISPOSITION:  Routine postop instructions were given.  She is to avoid  heavy lifting, vaginal entry, or driving a car.  She is to watch for  signs of infection.  Call if increasing nausea or vomiting.  Any active  bleeding should be reported.  Increasing abdominal pain, she also be  reported.  I instructed in the signs of deep venous thrombosis or  pulmonary embolus.  Discharged home on Tylox for pain.   PLAN:  Follow up in the office on Monday to check incision and remove  staples.      Darlyn Chamber, M.D.  Electronically Signed     JSM/MEDQ  D:  12/28/2008  T:  12/28/2008  Job:  [id number]

## 2011-03-24 NOTE — Op Note (Signed)
[name], [name]               ACCOUNT NO.:  [id number]   MEDICAL RECORD NO.:  [id number]          PATIENT TYPE:  INP   LOCATION:  5125                         FACILITY:  [facility]   PHYSICIAN:  Merri Ray. Grandville Silos, M.D.DATE OF BIRTH:  [birth year]   DATE OF PROCEDURE:  DATE OF DISCHARGE:                               OPERATIVE REPORT   PREOPERATIVE DIAGNOSIS:  Acute appendicitis.   POSTOPERATIVE DIAGNOSIS:  Ruptured appendicitis.   PROCEDURE:  Laparoscopic appendectomy.   PRIMARY SURGEON:  Merri Ray. Grandville Silos, M.D.   HISTORY OF PRESENT ILLNESS:  [name] is a 50 year old female who  presented to the emergency department with right lower quadrant  abdominal pain.  History, physical exam, and CT scan of the abdomen and  pelvis were consistent with acute appendicitis.  She was brought  emergently to the operating room.   PROCEDURE IN DETAIL:  Informed consent was obtained.  The patient  received intravenous antibiotics.  She was identified in the  preoperative holding area.  She was brought to the operating room.  General anesthesia was administered by the anesthesia staff.  Her  abdomen was prepped and draped in a sterile fashion.  The time-out  procedure was done.  An area inferior to her umbilicus was infiltrated  with 0.25% Marcaine with epinephrine.  An infraumbilical incision was  made.  Subcutaneous tissues were dissected down revealing anterior  fascia.  This was divided sharply along the midline.  Peritoneal cavity  was entered under direct vision.  A 0 Vicryl purse string suture was  placed around the fascial opening and the Hasson trocar was inserted  into the abdomen.  The abdomen was insufflated with carbon dioxide in a  standard fashion.  A 12-mm left lower quadrant port and a 5-mm right  upper quadrant port were placed.  A 0.25% Marcaine was used in all port  sites.  Laparoscopic exploration revealed a perforated acute  appendicitis with significant purulent fluid and  fibrinous exudate in  the right lower quadrant.  The appendix was gently dissected free from  the terminal ileum which had stuck to it.  As part of this inflammatory  process, there was a perforation along the mid portion of the appendix  and lot of purulent fluid in the pelvis.  The fluid was irrigated out.  The appendix base was then dissected out and divided with a GI-75  stapler with vascular load.  The mesoappendix was then divided partially  with the harmonic scalpel and then remainder of it was divided with  endoscopic GIA stapler with vascular load achieving excellent  hemostasis.  The abdomen was then copiously irrigated with 3 L of warm  saline.  She did have a large uterine fibroid that was visualized.  This  was also seen on CT scan.   The irrigation continued until irrigation fluid returned clear.  There  is no further purulent collection.  The staple lines were rechecked and  remained intact with no bleeding.  Remainder of the irrigation fluid was  evacuated.  Please note the appendix after its removal was placed in an  EndoCatch bag  and removed from the left lower quadrant port site.  Much  remainder of irrigation fluid was evacuated.  Ports were removed under  direct vision and pneumoperitoneum was released.  The infraumbilical  fascia was closed at that time with 0 Vicryl purse string suture with  care not to  trap any intraabdominal content.  All 3 wounds were copiously irrigated.  The skin was closed then with 4-0 Vicryl followed by Dermabond.  Sponge,  needle, and instrument counts were correct.  The patient tolerated the  procedure well without apparent complication and was taken to recovery  room in stable condition.      Merri Ray Grandville Silos, M.D.  Electronically Signed     BET/MEDQ  D:  10/30/2008  T:  10/31/2008  Job:  [id number]   cc:   Esperanza Richters, MD

## 2011-03-24 NOTE — H&P (Signed)
NAMERHEALYNN, [name]               ACCOUNT NO.:  [id number]   MEDICAL RECORD NO.:  [id number]          PATIENT TYPE:  INP   LOCATION:  5125                         FACILITY:  [facility]   PHYSICIAN:  Merri Ray. Grandville Silos, M.D.DATE OF BIRTH:  October 06, 1961   DATE OF ADMISSION:  10/30/2008  DATE OF DISCHARGE:                              HISTORY & PHYSICAL   CHIEF COMPLAINT:  Right lower quadrant abdominal pain.   HISTORY OF PRESENT ILLNESS:  The patient is a 50 year old white female  who developed dry heaves overnight last night.  This morning, she  developed some right-sided abdominal pain that gradually localized to  the right lower quadrant.  She was seen by Dr. Tawni Millers at [facility] and sent for CT scan of the abdomen and pelvis at [facility].  This was done and was consistent with acute  appendicitis, so I am asked to evaluate for further treatment.  She  continues to complain of right lower quadrant abdominal pain.  The  nausea has passed.   PAST MEDICAL HISTORY:  Negative.   PAST SURGICAL HISTORY:  Jaw surgery and tonsillectomy.   SOCIAL HISTORY:  She smokes cigarettes, but she does not drink alcohol  or use drugs.  She sells real estate and insurance.   ALLERGIES:  No known drug allergies.   CURRENT MEDICATIONS:  None.   REVIEW OF SYSTEMS:  GI complaints as above and are otherwise negative  except some generalized muscle soreness.   PHYSICAL EXAMINATION:  VITAL SIGNS:  Temperature 97.0, pulse 123,  respirations 18, blood pressure 97/65.  GENERAL:  She is awake and alert.  She is in mild distress and is  generally uncomfortable.  HEENT:  Pupils are equal.  Sclerae are clear.  Oral mucosa is somewhat  dry.  Face is symmetric.  NECK:  Supple, but no tenderness.  LYMPH:  No supraclavicular, cervical, or periumbilical lymphadenopathy.  PULMONARY:  Lungs are clear to auscultation with no wheezing.  CARDIAC:  Heart is regular with no murmurs.  Impulse is  palpable on the  left chest.  Distal pulses are 2+ without significant peripheral edema.  ABDOMEN:  Soft, but is tender on the right lower quadrant with voluntary  guarding.  There is no generalized peritoneal sign.  Bowel sounds are  absent.  No significant masses are palpated.  SKIN:  Warm and dry with no rashes.  NEUROLOGIC:  She is alert and oriented.  Speech is fluent.  She has no  gross focal deficits.   LABORATORY STUDIES:  Pending.  CT scan of the abdomen and pelvis  demonstrates acute appendicitis without evidence of rupture.  She also  has an 8.8 cm uterine fibroid and some sclerotic changes in her pelvic  films.   IMPRESSION:  Acute appendicitis.   PLAN:  We will take her to the operating room for a laparoscopic  appendectomy.  Procedure risks and benefits were discussed in detail  with the patient and her husband.  Questions were answered.  She was  agreeable.      Merri Ray Grandville Silos, M.D.  Electronically Signed  BET/MEDQ  D:  10/30/2008  T:  10/31/2008  Job:  [id number]   cc:   Esperanza Richters, MD

## 2011-03-24 NOTE — H&P (Signed)
[name], KEEPERS NO.:  [id number]   MEDICAL RECORD NO.:  [id number]          PATIENT TYPE:  AMB   LOCATION:  SDC                           FACILITY:  [facility]   PHYSICIAN:  Darlyn Chamber, M.D.   DATE OF BIRTH:  1961-10-16   DATE OF ADMISSION:  12/26/2008  DATE OF DISCHARGE:                              HISTORY & PHYSICAL   The patient is a 50 year old nulligravida female presents for  laparoscopic-assisted vaginal hysterectomy and bilateral salpingo-  oophorectomy.   In relation to present admission, the patient had a laparoscopic  appendectomy done in December of last year.  After the procedure, she  continued to have significant suprapubic pain discomfort.  A CT had  revealed a fundal fibroid and a __________ lucency that they thought  represented abscess, but we felt more like it was degeneration.  She  continued to have pain and discomfort.  She had required hospitalization  for pain management and has been injury with numerous antibiotics since  remains on narcotics at home.  Because of continued pain discomfort, we  are going to proceed with laparoscopic-assisted vaginal hysterectomy for  degenerating painful fibroids.  She does wish to have ovaries removed in  view of her age.   In terms of allergies, she is allergic to AUGMENTIN, BEE STINGS, and  SHELLFISH.   MEDICATIONS:  Tylox as needed for pain.  She has completed the course of  ciprofloxacin.   PAST MEDICAL HISTORY:  She has had usual childhood diseases.  No  significant sequelae.  She does have gallstones and history of urinary  tract infections as well as migraine headaches.   PAST SURGICAL HISTORY:  She has had her appendix removed and had a  previous jaw surgery.   SOCIAL HISTORY:  Does feel to have one-and-half pack per day.  No  tobacco use.  No alcohol use.   FAMILY HISTORY:  Mother is with history of diabetes.  Father and  brothers with a history of hypertension.  She had a family  history of  both throat and stomach cancer in grandparents.  Father with a history  of heart disease.   REVIEW OF SYSTEMS:  Noncontributory.   PHYSICAL EXAMINATION:  GENERAL:  The patient is afebrile.  VITAL SIGNS:  Stable.  HEENT:  The patient is normocephalic.  Pupils are equal, round, and  reactive to light and accommodation.  Extraocular movements were intact.  Sclerae and conjunctivae are clear.  Oropharynx is clear.  NECK:  Without thyromegaly.  BREASTS:  Not examined.  LUNGS:  Clear.  CARDIAC:  Regular rate.  No murmurs or gallops.  ABDOMEN:  Does feel suprapubic tenderness.  No mass or organomegaly.  Bowel sounds are active.  No peritoneal signs.  PELVIC:  Normal external genitalia.  Vaginal mucosa is clear.  Cervix is  unremarkable.  Uterus is enlarged with fullness to the right side  consistent with fibroid.  Adnexa are unremarkable.  EXTREMITIES:  Trace edema.  NEUROLOGIC:  Grossly within normal limits.   IMPRESSION:  Symptomatic uterine fibroid.   PLAN:  The patient will undergo laparoscopic-assisted  vaginal  hysterectomy and bilateral salpingo-oophorectomy.  The risks of surgery  have been discussed including the risk of infection.  The risk of  hemorrhage that could require transfusion with the risk of AIDS or  hepatitis.  Risk of injury to adjacent organs including bladder, bowel,  or ureters, that could require further exploratory surgery.  Risk of  deep venous thrombosis and pulmonary embolus.  We also discussed the  potential need for exploratory surgery if we cannot get it out  laparoscopically, she is undergoing a bowel prep.      Darlyn Chamber, M.D.  Electronically Signed     JSM/MEDQ  D:  12/25/2008  T:  12/26/2008  Job:  [id number]

## 2011-03-24 NOTE — H&P (Signed)
NAMESTEPHANY, [name] NO.:  [id number]   MEDICAL RECORD NO.:  [id number]          PATIENT TYPE:  INP   LOCATION:                                FACILITY:  [facility]   PHYSICIAN:  Darlyn Chamber, M.D.   DATE OF BIRTH:  [birth year]   DATE OF ADMISSION:  12/11/2008  DATE OF DISCHARGE:                              HISTORY & PHYSICAL   A 50 year old nulligravida female is admitted for pain management.   In relation to present admission, the patient has known uterine  fibroids.  She has been having increasing pain and discomfort from  apparent degeneration of the fibroid.  She has been taking Anaprox, was  doing fairly well.  Subsequently over the last 2-3 days, she has had  increasing nausea and unable to tolerate the Anaprox.  She came in the  office, was noted to be significantly uncomfortable.  We decided to  bring her in for pain management.   ALLERGIES:  She is allergic to AUGMENTIN, BEE STINGS, and SHELLFISH.   PAST MEDICAL HISTORY:  Usual childhood diseases without any significant  sequelae.  Does have a history of migraine headaches as well as urinary  tract infections.  She also has gallstones at the present time.   PAST SURGERIES:  She had her appendix removed in October 30, 2008, and  she has had previous jaw surgery.   FAMILY HISTORY:  Noncontributory.   SOCIAL HISTORY:  She does do 1-1/2-pack per day tobacco use.  No alcohol  use.   REVIEW OF SYSTEMS:  Noncontributory.   PHYSICAL EXAMINATION:  VITAL SIGNS:  The patient is afebrile, stable  vital signs.  LUNGS:  Clear.  CARDIAC SYSTEM:  Regular rate without murmurs or gallops.  ABDOMEN:  She does have diffuse lower abdominal tenderness with no real  peritoneal signs.  Bowel sounds are active.  PELVIC:  Uterus is moderately enlarged with known uterine fibroids,  mildly tender.  Adnexa unremarkable.  EXTREMITIES:  Trace edema.  NEUROLOGIC:  Grossly within normal limits.   IMPRESSION:  Increasing pain  from apparent degenerating fibroid.   PLAN:  At the present time, the patient will be brought in for pain  management.  Routine labs will be obtained.  We will see how she  responds.      Darlyn Chamber, M.D.  Electronically Signed     JSM/MEDQ  D:  12/11/2008  T:  12/12/2008  Job:  [id number]

## 2011-03-24 NOTE — Discharge Summary (Signed)
[name], [name]               ACCOUNT NO.:  [id number]   MEDICAL RECORD NO.:  [id number]          PATIENT TYPE:  INP   LOCATION:  9303                          FACILITY:  [facility]   PHYSICIAN:  Darlyn Chamber, M.D.   DATE OF BIRTH:  09/13/1961   DATE OF ADMISSION:  12/11/2008  DATE OF DISCHARGE:  12/12/2008                               DISCHARGE SUMMARY   ADMITTING DIAGNOSIS:  Pain management for degenerating fibroid.   DISCHARGE DIAGNOSIS:  Pain management for degenerating fibroid.   For complete history and physical please see dictated history and  physical.   [place]:  The patient was brought in and began on  Dilaudid PCA.  Also, given Motrin and something for nausea.  CBC and  metabolic panel were normal.  She was observed overnight.  The following  morning she was feeling much better.  She had tolerated breakfast.  No  nausea or vomiting.  Her abdomen was softer and actually much less  tender.  Bowel sounds were active.  She is remained afebrile, stable  vital signs, and was discharged home.   IMPRESSION:  Degenerating fibroid.   PLAN:  The patient is to discharged home on Phenergan as needed for  nausea and Tylox as needed for pain.  We are going to try to arrange  hysterectomy concomitantly with gallbladder removal.  The patient is to  call with an increasing pain and discomfort or fever.      Darlyn Chamber, M.D.  Electronically Signed     JSM/MEDQ  D:  12/12/2008  T:  12/12/2008  Job:  [id number]

## 2011-08-14 LAB — CBC
HCT: 31.7 % — ABNORMAL LOW (ref 36.0–46.0)
HCT: 35.9 % — ABNORMAL LOW (ref 36.0–46.0)
HCT: 44.3 % (ref 36.0–46.0)
Hemoglobin: 10.9 g/dL — ABNORMAL LOW (ref 12.0–15.0)
Hemoglobin: 12.1 g/dL (ref 12.0–15.0)
Hemoglobin: 15 g/dL (ref 12.0–15.0)
MCHC: 33.8 g/dL (ref 30.0–36.0)
MCHC: 33.9 g/dL (ref 30.0–36.0)
MCHC: 34.3 g/dL (ref 30.0–36.0)
MCV: 94.1 fL (ref 78.0–100.0)
MCV: 95.2 fL (ref 78.0–100.0)
MCV: 95.6 fL (ref 78.0–100.0)
Platelets: 212 10*3/uL (ref 150–400)
Platelets: 215 10*3/uL (ref 150–400)
Platelets: 272 10*3/uL (ref 150–400)
RBC: 3.33 MIL/uL — ABNORMAL LOW (ref 3.87–5.11)
RBC: 3.75 MIL/uL — ABNORMAL LOW (ref 3.87–5.11)
RBC: 4.71 MIL/uL (ref 3.87–5.11)
RDW: 12.8 % (ref 11.5–15.5)
RDW: 13 % (ref 11.5–15.5)
RDW: 13.1 % (ref 11.5–15.5)
WBC: 12.9 10*3/uL — ABNORMAL HIGH (ref 4.0–10.5)
WBC: 17.8 10*3/uL — ABNORMAL HIGH (ref 4.0–10.5)
WBC: 19.7 10*3/uL — ABNORMAL HIGH (ref 4.0–10.5)

## 2011-08-14 LAB — URINALYSIS, ROUTINE W REFLEX MICROSCOPIC
Bilirubin Urine: NEGATIVE
Glucose, UA: NEGATIVE mg/dL
Hgb urine dipstick: NEGATIVE
Ketones, ur: 15 mg/dL — AB
Nitrite: NEGATIVE
Protein, ur: NEGATIVE mg/dL
Specific Gravity, Urine: 1.01 (ref 1.005–1.030)
Urobilinogen, UA: 1 mg/dL (ref 0.0–1.0)
pH: 6.5 (ref 5.0–8.0)

## 2011-08-14 LAB — COMPREHENSIVE METABOLIC PANEL
ALT: 19 U/L (ref 0–35)
AST: 22 U/L (ref 0–37)
Albumin: 4.1 g/dL (ref 3.5–5.2)
Alkaline Phosphatase: 58 U/L (ref 39–117)
BUN: 11 mg/dL (ref 6–23)
CO2: 20 mEq/L (ref 19–32)
Calcium: 9.7 mg/dL (ref 8.4–10.5)
Chloride: 100 mEq/L (ref 96–112)
Creatinine, Ser: 0.81 mg/dL (ref 0.4–1.2)
GFR calc Af Amer: >60 mL/min (ref >60)
GFR calc non Af Amer: >60 mL/min (ref >60)
Glucose, Bld: 103 mg/dL — ABNORMAL HIGH (ref 70–99)
Potassium: 4.6 mEq/L (ref 3.5–5.1)
Sodium: 132 mEq/L — ABNORMAL LOW (ref 135–145)
Total Bilirubin: 1.3 mg/dL — ABNORMAL HIGH (ref 0.3–1.2)
Total Protein: 7.2 g/dL (ref 6.0–8.3)

## 2011-08-14 LAB — URINE CULTURE
Colony Count: NO GROWTH
Culture: NO GROWTH

## 2011-08-14 LAB — DIFFERENTIAL
Basophils Absolute: 0.1 10*3/uL (ref 0.0–0.1)
Basophils Relative: 0 % (ref 0–1)
Eosinophils Absolute: 0 10*3/uL (ref 0.0–0.7)
Eosinophils Relative: 0 % (ref 0–5)
Lymphocytes Relative: 7 % — ABNORMAL LOW (ref 12–46)
Lymphs Abs: 1.3 10*3/uL (ref 0.7–4.0)
Monocytes Absolute: 0.2 10*3/uL (ref 0.1–1.0)
Monocytes Relative: 1 % — ABNORMAL LOW (ref 3–12)
Neutro Abs: 18.2 10*3/uL — ABNORMAL HIGH (ref 1.7–7.7)
Neutrophils Relative %: 92 % — ABNORMAL HIGH (ref 43–77)

## 2011-08-14 LAB — LIPASE, BLOOD: Lipase: 20 U/L (ref 11–59)

## 2011-10-19 LAB — CBC AND DIFFERENTIAL
HCT: 44 (ref 36–46)
Hemoglobin: 15.1 (ref 12.0–16.0)
Platelets: 241 (ref 150–399)
WBC: 7.5

## 2011-10-20 ENCOUNTER — Emergency Department (HOSPITAL_COMMUNITY)
Admission: EM | Admit: 2011-10-20 | Discharge: 2011-10-21 | Disposition: A | Discharge status: Home / Self Care | Payer: 59 | Attending: Emergency Medicine | Admitting: Emergency Medicine

## 2011-10-20 ENCOUNTER — Encounter: Payer: Self-pay | Admitting: *Deleted

## 2011-10-20 ENCOUNTER — Emergency Department (HOSPITAL_COMMUNITY): Payer: 59

## 2011-10-20 DIAGNOSIS — R112 Nausea with vomiting, unspecified: Secondary | ICD-10-CM | POA: Insufficient documentation

## 2011-10-20 DIAGNOSIS — R109 Unspecified abdominal pain: Secondary | ICD-10-CM | POA: Insufficient documentation

## 2011-10-20 DIAGNOSIS — J189 Pneumonia, unspecified organism: Secondary | ICD-10-CM | POA: Insufficient documentation

## 2011-10-20 DIAGNOSIS — R509 Fever, unspecified: Secondary | ICD-10-CM | POA: Insufficient documentation

## 2011-10-20 DIAGNOSIS — R05 Cough: Secondary | ICD-10-CM | POA: Insufficient documentation

## 2011-10-20 DIAGNOSIS — R059 Cough, unspecified: Secondary | ICD-10-CM | POA: Insufficient documentation

## 2011-10-20 DIAGNOSIS — E860 Dehydration: Secondary | ICD-10-CM

## 2011-10-20 MED ORDER — ONDANSETRON HCL 4 MG/2ML IJ SOLN
4.0000 mg | Freq: Once | INTRAMUSCULAR | Status: AC
  Administered 2011-10-21: 4 mg via INTRAVENOUS
  Filled 2011-10-20: qty 2

## 2011-10-20 MED ORDER — SODIUM CHLORIDE 0.9 % IV BOLUS (SEPSIS)
2000.0000 mL | Freq: Once | INTRAVENOUS | Status: AC
  Administered 2011-10-21: 1000 mL via INTRAVENOUS

## 2011-10-20 NOTE — ED Notes (Signed)
Pt states she went to [facility] yesterday. Pt states they dx her with pna. Pt states her nausea medication is not working and she feels worst

## 2011-10-21 LAB — COMPREHENSIVE METABOLIC PANEL
ALT: 58 U/L — ABNORMAL HIGH (ref 0–35)
AST: 42 U/L — ABNORMAL HIGH (ref 0–37)
Albumin: 3.8 g/dL (ref 3.5–5.2)
Alkaline Phosphatase: 72 U/L (ref 39–117)
BUN: 12 mg/dL (ref 6–23)
CO2: 22 mEq/L (ref 19–32)
Calcium: 9.2 mg/dL (ref 8.4–10.5)
Chloride: 96 mEq/L (ref 96–112)
Creatinine, Ser: 1.13 mg/dL — ABNORMAL HIGH (ref 0.50–1.10)
GFR calc Af Amer: 64 mL/min — ABNORMAL LOW (ref >90)
GFR calc non Af Amer: 55 mL/min — ABNORMAL LOW (ref >90)
Glucose, Bld: 102 mg/dL — ABNORMAL HIGH (ref 70–99)
Potassium: 3.9 mEq/L (ref 3.5–5.1)
Sodium: 130 mEq/L — ABNORMAL LOW (ref 135–145)
Total Bilirubin: 0.3 mg/dL (ref 0.3–1.2)
Total Protein: 7.9 g/dL (ref 6.0–8.3)

## 2011-10-21 LAB — URINALYSIS, ROUTINE W REFLEX MICROSCOPIC
Bilirubin Urine: NEGATIVE
Glucose, UA: NEGATIVE mg/dL
Leukocytes, UA: NEGATIVE
Nitrite: NEGATIVE
Protein, ur: NEGATIVE mg/dL
Specific Gravity, Urine: 1.014 (ref 1.005–1.030)
Urobilinogen, UA: 0.2 mg/dL (ref 0.0–1.0)
pH: 5.5 (ref 5.0–8.0)

## 2011-10-21 LAB — CBC
HCT: 38.9 % (ref 36.0–46.0)
Hemoglobin: 13.5 g/dL (ref 12.0–15.0)
MCH: 31.8 pg (ref 26.0–34.0)
MCHC: 34.7 g/dL (ref 30.0–36.0)
MCV: 91.5 fL (ref 78.0–100.0)
Platelets: 194 10*3/uL (ref 150–400)
RBC: 4.25 MIL/uL (ref 3.87–5.11)
RDW: 13.1 % (ref 11.5–15.5)
WBC: 7.8 10*3/uL (ref 4.0–10.5)

## 2011-10-21 LAB — DIFFERENTIAL
Basophils Absolute: 0 10*3/uL (ref 0.0–0.1)
Basophils Relative: 0 % (ref 0–1)
Eosinophils Absolute: 0.1 10*3/uL (ref 0.0–0.7)
Eosinophils Relative: 1 % (ref 0–5)
Lymphocytes Relative: 19 % (ref 12–46)
Lymphs Abs: 1.5 10*3/uL (ref 0.7–4.0)
Monocytes Absolute: 0.5 10*3/uL (ref 0.1–1.0)
Monocytes Relative: 6 % (ref 3–12)
Neutro Abs: 5.7 10*3/uL (ref 1.7–7.7)
Neutrophils Relative %: 74 % (ref 43–77)

## 2011-10-21 LAB — URINE MICROSCOPIC-ADD ON

## 2011-10-21 LAB — LIPASE, BLOOD: Lipase: 14 U/L (ref 11–59)

## 2011-10-21 LAB — LACTIC ACID, PLASMA: Lactic Acid, Venous: 0.9 mmol/L (ref 0.5–2.2)

## 2011-10-21 MED ORDER — SODIUM CHLORIDE 0.9 % IV BOLUS (SEPSIS)
2000.0000 mL | Freq: Once | INTRAVENOUS | Status: AC
  Administered 2011-10-21: 1000 mL via INTRAVENOUS

## 2011-10-21 MED ORDER — KETOROLAC TROMETHAMINE 30 MG/ML IJ SOLN
30.0000 mg | Freq: Once | INTRAMUSCULAR | Status: AC
  Administered 2011-10-21: 30 mg via INTRAVENOUS
  Filled 2011-10-21: qty 1

## 2011-10-21 NOTE — ED Provider Notes (Signed)
History     CSN: [id number] Arrival date & time: 10/20/2011 10:11 PM   First MD Initiated Contact with Patient 10/20/11 2324      Chief Complaint  Patient presents with  . Pneumonia  . Nausea  . Emesis    (Consider location/radiation/quality/duration/timing/severity/associated sxs/prior treatment) HPI Patient is a 50 yo female who presents for several days of cough as well as associated subjective fevers and malaise.  She was seen at an urgent care yesterday and diagnosed with pneumonia.  She was started on avelox, cough medicine, and an albuterol inhaler at that time.  She has received 2 doses of avelox.  She has also had nausea and vomiting which worsened despite antiemetic given at urgent care.  She denies abdominal pain aside from when she is actually vomiting.  Emesis is consistent with gastric contents.  Patient denies urinary symptoms.  She is achy all over and did not receive influenza vaccine this year.  There are no other associated or modifying factors.  Patient has no alleviating or exacerbating factors.   History reviewed. No pertinent past medical history.  Past Surgical History  Procedure Date  . Appendectomy   . Abdominal hysterectomy     History reviewed. No pertinent family history.  History  Substance Use Topics  . Smoking status: Current Everyday Smoker  . Smokeless tobacco: Not on file  . Alcohol Use: Yes     occa    OB History    Grav Para Term Preterm Abortions TAB SAB Ect Mult Living                  Review of Systems  Constitutional: Positive for fever, chills, activity change, appetite change and fatigue.  HENT: Positive for congestion.   Eyes: Negative.   Respiratory: Positive for cough.   Cardiovascular: Negative.   Gastrointestinal: Positive for nausea, vomiting and abdominal pain.  Genitourinary: Negative.   Musculoskeletal: Positive for myalgias.  Skin: Negative.   Neurological: Negative.   Hematological: Negative.     Psychiatric/Behavioral: Negative.   All other systems reviewed and are negative.    Allergies  DHW:YSHUOHFGBMS+XJDBZMCEY+EMVVKPQAES acid+aspartame  Home Medications   Current Outpatient Rx  Name Route Sig Dispense Refill  . ALBUTEROL SULFATE HFA 108 (90 BASE) MCG/ACT IN AERS Inhalation Inhale 2 puffs into the lungs every 6 (six) hours as needed. For wheezing     . MOXIFLOXACIN HCL 400 MG PO TABS Oral Take 400 mg by mouth daily.      Marland Kitchen PROMETHAZINE HCL 25 MG PO TABS Oral Take 25 mg by mouth every 6 (six) hours as needed. For nausea     . PSEUDOEPH-CHLORPHEN-HYDROCOD 60-4-5 MG/5ML PO SOLN Oral Take 5 mLs by mouth at bedtime as needed. For coughing sinus congestion       BP 94/55  Pulse 79  Temp(Src) 98.9 F (37.2 C) (Oral)  Resp 15  Ht 5' 5"  (1.651 m)  Wt 180 lb (81.647 kg)  BMI 29.95 kg/m2  SpO2 93%  Physical Exam  Nursing note and vitals reviewed. Constitutional: She is oriented to person, place, and time. She appears well-developed and well-nourished. No distress.       Very uncomfortable  HENT:  Head: Normocephalic and atraumatic.  Nose: Mucosal edema present.  Mouth/Throat: Posterior oropharyngeal erythema present. No oropharyngeal exudate.  Eyes: Conjunctivae and EOM are normal. Pupils are equal, round, and reactive to light.  Neck: Normal range of motion.  Cardiovascular: Normal rate, regular rhythm, normal heart sounds and intact distal  pulses.  Exam reveals no gallop and no friction rub.   No murmur heard. Pulmonary/Chest: Effort normal and breath sounds normal. No respiratory distress. She has no wheezes. She has no rales.  Abdominal: Soft. Bowel sounds are normal. She exhibits no distension. There is no tenderness. There is no rebound.  Musculoskeletal: Normal range of motion. She exhibits no edema.  Neurological: She is alert and oriented to person, place, and time. No cranial nerve deficit. She exhibits normal muscle tone. Coordination normal.  Skin: Skin is  warm and dry. No rash noted.  Psychiatric: She has a normal mood and affect.    ED Course  Procedures (including critical care time)  Labs Reviewed  COMPREHENSIVE METABOLIC PANEL - Abnormal; Notable for the following:    Sodium 130 (*)    Glucose, Bld 102 (*)    Creatinine, Ser 1.13 (*)    AST 42 (*)    ALT 58 (*)    GFR calc non Af Amer 55 (*)    GFR calc Af Amer 64 (*)    All other components within normal limits  URINALYSIS, ROUTINE W REFLEX MICROSCOPIC - Abnormal; Notable for the following:    Hgb urine dipstick TRACE (*)    Ketones, ur TRACE (*)    All other components within normal limits  URINE MICROSCOPIC-ADD ON - Abnormal; Notable for the following:    Bacteria, UA MANY (*)    All other components within normal limits  CBC  DIFFERENTIAL  LIPASE, BLOOD  LACTIC ACID, PLASMA  URINE CULTURE   Dg Chest 2 View  10/21/2011  *RADIOLOGY REPORT*  Clinical Data: Cough and fever  CHEST - 2 VIEW  Comparison: None.  Findings: Right lung is clear.  A flame shaped density is seen in the left base. The cardiopericardial silhouette is within normal limits for size. Imaged bony structures of the thorax are intact.  IMPRESSION: Flame shaped opacity in the left lower lung.  This may be related to atelectasis or pneumonia.  Follow-up imaging is recommended to ensure that the finding resolves completely.  Original Report Authenticated By: ERIC A. MANSELL, M.D.     1. Dehydration   2. Community acquired pneumonia       MDM  Patient was very uncomfortable and dehydrated appearing.  She had received both doses of her antibiotic but had not keep nausea meds down at home.  Patient did have both clinical and objective evidence of dehydration on labs and was rehydrated here.  She was treated with IV zofran as well and was not vomiting here.  CXR showed concerning signs of PNA but patient was already being treated for this appropriately with avelox.  CBC and lactate were unremarkable.  UA, LFTs,  and lipase were checked based on nausea and showed only mild elevation of transaminases consistent with prior values.  Patient felt better following therapy and maintained her O2 sat with ambulation.  Patient reported her normal BP was in the 02R systolic and she was not tachy.  Patient was felt safe for discharge and was discharged in good condition with instructions to continue all of her previously prescribed meds and to drink plenty of fluids.        Chauncy Passy, MD 10/21/11 [phone]

## 2011-10-21 NOTE — ED Notes (Signed)
Second L of NS hung per order.

## 2011-10-22 LAB — URINE CULTURE
Colony Count: NO GROWTH
Culture  Setup Time: 201212120351
Culture: NO GROWTH

## 2012-08-16 ENCOUNTER — Ambulatory Visit (INDEPENDENT_AMBULATORY_CARE_PROVIDER_SITE_OTHER): Payer: 59 | Admitting: Internal Medicine

## 2012-08-16 ENCOUNTER — Ambulatory Visit (INDEPENDENT_AMBULATORY_CARE_PROVIDER_SITE_OTHER)
Admission: RE | Admit: 2012-08-16 | Discharge: 2012-08-16 | Disposition: A | Discharge status: Home / Self Care | Payer: 59 | Source: Ambulatory Visit | Attending: Internal Medicine | Admitting: Internal Medicine

## 2012-08-16 ENCOUNTER — Encounter: Payer: Self-pay | Admitting: Internal Medicine

## 2012-08-16 VITALS — BP 122/74 | HR 88 | Temp 98.3°F | Ht 64.25 in | Wt 182.0 lb

## 2012-08-16 DIAGNOSIS — R5381 Other malaise: Secondary | ICD-10-CM

## 2012-08-16 DIAGNOSIS — Z720 Tobacco use: Secondary | ICD-10-CM | POA: Insufficient documentation

## 2012-08-16 DIAGNOSIS — R5383 Other fatigue: Secondary | ICD-10-CM

## 2012-08-16 DIAGNOSIS — Z0000 Encounter for general adult medical examination without abnormal findings: Secondary | ICD-10-CM

## 2012-08-16 DIAGNOSIS — R059 Cough, unspecified: Secondary | ICD-10-CM

## 2012-08-16 DIAGNOSIS — Z8349 Family history of other endocrine, nutritional and metabolic diseases: Secondary | ICD-10-CM

## 2012-08-16 DIAGNOSIS — R5382 Chronic fatigue, unspecified: Secondary | ICD-10-CM

## 2012-08-16 DIAGNOSIS — Z1231 Encounter for screening mammogram for malignant neoplasm of breast: Secondary | ICD-10-CM

## 2012-08-16 DIAGNOSIS — R7401 Elevation of levels of liver transaminase levels: Secondary | ICD-10-CM

## 2012-08-16 DIAGNOSIS — R05 Cough: Secondary | ICD-10-CM

## 2012-08-16 DIAGNOSIS — F17200 Nicotine dependence, unspecified, uncomplicated: Secondary | ICD-10-CM

## 2012-08-16 NOTE — Assessment & Plan Note (Signed)
She has history of transaminitis in the past. She has strong family history of hemochromatosis. Obtain iron levels, percent saturation and ferritin level. If elevated we discussed obtaining genetic testing for hemochromatosis.

## 2012-08-16 NOTE — Assessment & Plan Note (Signed)
I encouraged smoking cessation. Patient is not yet ready to proceed with smoking cessation. We did discuss various strategies to help her is quite smoking.

## 2012-08-16 NOTE — Progress Notes (Signed)
Subjective:    Patient ID: [name], female    DOB: Dec 23, 1960, 51 y.o.   MRN: [id number]  HPI  51 year old white female to establish. Patient denies history of chronic illnesses. She did suffer from acute appendicitis in 2009 and had to undergo emergency appendectomy. She also had a complete hysterectomy in 2010 and severe bladder infection. She reports she has chronic bowel issues after her abdominal surgery including abdominal bloating. She was seen by gastroenterology in the past and underwent EGD and colonoscopy. Patient noted to have bulbar duodenitis. She reports she was negative for H. Pylori and does not use any NSAIDs. Her abdominal symptoms have improved. She was told that she might have gallbladder disease. She was evaluated by surgeon who did not recommend elective cholecystectomy.   Patient concerned today about symptoms of chronic fatigue. Her symptoms have been ongoing for the last 1 to 2 months. She has strong family history of hemachromatosis. Her father, aunt and grandmother all had hemachromatosis. Grandmother died of complications of  liver cirrhosis.  Review of Systems   Constitutional: Negative for activity change, appetite change and unexpected weight change.  Eyes: Negative for visual disturbance.  Respiratory: intermittent cough, negative for chest tightness.  Dyspnea with exertion Cardiovascular: Negative for chest pain.  Genitourinary: Negative for difficulty urinating.  Neurological: Negative for headaches.  Gastrointestinal: Negative for heartburn, melena or hematochezia Psych: Negative for depression or anxiety  Past Medical History  Diagnosis Date  . Depression   . Chronic headaches   . Fatty liver   . Gallstones     History   Social History  . Marital Status: Married    Spouse Name: N/A    Number of Children: N/A  . Years of Education: N/A   Occupational History  . Director of compensation and benefits     [employer]   Social  History Main Topics  . Smoking status: Current Every Day Smoker -- 1.0 packs/day for 32 years    Types: Cigarettes  . Smokeless tobacco: Not on file  . Alcohol Use: Yes     occa  . Drug Use: Not on file  . Sexually Active: Yes   Other Topics Concern  . 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 Appendectomy   . Abdominal hysterectomy   . Temporomandibular joint arthroplasty   . Tonsillectomy     Family History  Problem Relation Age of Onset  . Hyperlipidemia    . Heart disease Father   . Hypertension Father   . Diabetes Mother   . Alcohol abuse Maternal Grandfather   . Arthritis    . Sudden death    . Hemochromatosis Father     Allergies  Allergen Reactions  . Amoxicillin-Pot Clavulanate Rash, Other (See Comments) and Hypertension    Extreme headache    No current outpatient prescriptions on file prior to visit.    BP 122/74  Pulse 88  Temp 98.3 F (36.8 C) (Oral)  Ht 5' 4.25" (1.632 m)  Wt 182 lb (82.555 kg)  BMI 31.00 kg/m2      Objective:   Physical Exam  Constitutional: She is oriented to person, place, and time. She appears well-developed and well-nourished. No distress.  HENT:  Head: Normocephalic and atraumatic.  Right Ear: External  ear normal.  Left Ear: External ear normal.  Mouth/Throat: Oropharynx is clear and moist.  Eyes: EOM are normal. Pupils are equal, round, and reactive to light. No scleral icterus.  Neck: Normal range of motion. Neck supple. No thyromegaly present.       No carotid bruit  Cardiovascular: Normal rate, regular rhythm and normal heart sounds.   No murmur heard. Pulmonary/Chest: Effort normal and breath sounds normal. She has no wheezes.  Abdominal: Soft. Bowel sounds are normal. She exhibits no mass. There is no rebound.    Musculoskeletal: She exhibits no edema.  Lymphadenopathy:    She has no cervical adenopathy.  Neurological: She is alert and oriented to person, place, and time. No cranial nerve deficit.  Skin: Skin is warm and dry.  Psychiatric: She has a normal mood and affect. Her behavior is normal.          Assessment & Plan:

## 2012-08-16 NOTE — Assessment & Plan Note (Signed)
51 year old white female smoker complains of chronic fatigue over last 1-2 months. Rule out metabolic disorder. Considering smoking history obtain chest x-ray.

## 2012-08-16 NOTE — Patient Instructions (Signed)
Use nicotine lozenges as directed to help you quit smoking Our office will contact you re: blood test results

## 2012-08-17 ENCOUNTER — Other Ambulatory Visit (INDEPENDENT_AMBULATORY_CARE_PROVIDER_SITE_OTHER): Payer: 59

## 2012-08-17 DIAGNOSIS — R5383 Other fatigue: Secondary | ICD-10-CM

## 2012-08-17 DIAGNOSIS — R5381 Other malaise: Secondary | ICD-10-CM

## 2012-08-17 DIAGNOSIS — Z0000 Encounter for general adult medical examination without abnormal findings: Secondary | ICD-10-CM

## 2012-08-17 DIAGNOSIS — R5382 Chronic fatigue, unspecified: Secondary | ICD-10-CM

## 2012-08-17 DIAGNOSIS — Z8349 Family history of other endocrine, nutritional and metabolic diseases: Secondary | ICD-10-CM

## 2012-08-17 DIAGNOSIS — R7401 Elevation of levels of liver transaminase levels: Secondary | ICD-10-CM

## 2012-08-17 LAB — BASIC METABOLIC PANEL
BUN: 14 mg/dL (ref 6–23)
CO2: 25 mEq/L (ref 19–32)
Calcium: 9.2 mg/dL (ref 8.4–10.5)
Chloride: 105 mEq/L (ref 96–112)
Creatinine, Ser: 0.9 mg/dL (ref 0.4–1.2)
GFR: 72.52 mL/min (ref >60.00)
Glucose, Bld: 103 mg/dL — ABNORMAL HIGH (ref 70–99)
Potassium: 4.7 mEq/L (ref 3.5–5.1)
Sodium: 138 mEq/L (ref 135–145)

## 2012-08-17 LAB — CBC WITH DIFFERENTIAL/PLATELET
Basophils Absolute: 0 10*3/uL (ref 0.0–0.1)
Basophils Relative: 0.5 % (ref 0.0–3.0)
Eosinophils Absolute: 0.2 10*3/uL (ref 0.0–0.7)
Eosinophils Relative: 2.3 % (ref 0.0–5.0)
HCT: 42.1 % (ref 36.0–46.0)
Hemoglobin: 14.2 g/dL (ref 12.0–15.0)
Lymphocytes Relative: 25.9 % (ref 12.0–46.0)
Lymphs Abs: 2.2 10*3/uL (ref 0.7–4.0)
MCHC: 33.8 g/dL (ref 30.0–36.0)
MCV: 94.2 fl (ref 78.0–100.0)
Monocytes Absolute: 0.4 10*3/uL (ref 0.1–1.0)
Monocytes Relative: 4.8 % (ref 3.0–12.0)
Neutro Abs: 5.7 10*3/uL (ref 1.4–7.7)
Neutrophils Relative %: 66.5 % (ref 43.0–77.0)
Platelets: 238 10*3/uL (ref 150.0–400.0)
RBC: 4.47 Mil/uL (ref 3.87–5.11)
RDW: 13.3 % (ref 11.5–14.6)
WBC: 8.6 10*3/uL (ref 4.5–10.5)

## 2012-08-17 LAB — LDL CHOLESTEROL, DIRECT: Direct LDL: 226.5 mg/dL

## 2012-08-17 LAB — HEPATIC FUNCTION PANEL
ALT: 89 U/L — ABNORMAL HIGH (ref 0–35)
AST: 58 U/L — ABNORMAL HIGH (ref 0–37)
Albumin: 4 g/dL (ref 3.5–5.2)
Alkaline Phosphatase: 62 U/L (ref 39–117)
Bilirubin, Direct: 0.1 mg/dL (ref 0.0–0.3)
Total Bilirubin: 0.6 mg/dL (ref 0.3–1.2)
Total Protein: 7.7 g/dL (ref 6.0–8.3)

## 2012-08-17 LAB — LIPID PANEL
Cholesterol: 305 mg/dL — ABNORMAL HIGH (ref 0–200)
HDL: 41.7 mg/dL (ref >39.00)
Total CHOL/HDL Ratio: 7
Triglycerides: 203 mg/dL — ABNORMAL HIGH (ref 0.0–149.0)
VLDL: 40.6 mg/dL — ABNORMAL HIGH (ref 0.0–40.0)

## 2012-08-17 LAB — HIGH SENSITIVITY CRP: CRP, High Sensitivity: 4.11 mg/dL (ref 0.000–5.000)

## 2012-08-17 LAB — IBC PANEL
Iron: 120 ug/dL (ref 42–145)
Saturation Ratios: 33.4 % (ref 20.0–50.0)
Transferrin: 256.5 mg/dL (ref 212.0–360.0)

## 2012-08-17 LAB — FERRITIN: Ferritin: 208.3 ng/mL (ref 10.0–291.0)

## 2012-08-17 LAB — TSH: TSH: 1.79 u[IU]/mL (ref 0.35–5.50)

## 2012-08-18 LAB — T4, FREE: Free T4: 0.63 ng/dL (ref 0.60–1.60)

## 2012-08-24 ENCOUNTER — Other Ambulatory Visit (INDEPENDENT_AMBULATORY_CARE_PROVIDER_SITE_OTHER): Payer: 59

## 2012-08-24 DIAGNOSIS — R7401 Elevation of levels of liver transaminase levels: Secondary | ICD-10-CM

## 2012-08-30 LAB — HEMOCHROMATOSIS DNA-PCR(C282Y,H63D): DNA Mutation Analysis: NOT DETECTED

## 2012-09-07 ENCOUNTER — Other Ambulatory Visit: Payer: Self-pay | Admitting: Internal Medicine

## 2012-09-07 ENCOUNTER — Ambulatory Visit (INDEPENDENT_AMBULATORY_CARE_PROVIDER_SITE_OTHER): Payer: 59 | Admitting: Internal Medicine

## 2012-09-07 ENCOUNTER — Encounter: Payer: Self-pay | Admitting: Internal Medicine

## 2012-09-07 VITALS — BP 104/66 | HR 80 | Temp 98.7°F | Wt 182.0 lb

## 2012-09-07 DIAGNOSIS — G4700 Insomnia, unspecified: Secondary | ICD-10-CM

## 2012-09-07 DIAGNOSIS — R0609 Other forms of dyspnea: Secondary | ICD-10-CM

## 2012-09-07 DIAGNOSIS — R0683 Snoring: Secondary | ICD-10-CM

## 2012-09-07 DIAGNOSIS — R7401 Elevation of levels of liver transaminase levels: Secondary | ICD-10-CM

## 2012-09-07 DIAGNOSIS — R7402 Elevation of levels of lactic acid dehydrogenase (LDH): Secondary | ICD-10-CM

## 2012-09-07 DIAGNOSIS — R5382 Chronic fatigue, unspecified: Secondary | ICD-10-CM

## 2012-09-07 DIAGNOSIS — E785 Hyperlipidemia, unspecified: Secondary | ICD-10-CM | POA: Insufficient documentation

## 2012-09-07 DIAGNOSIS — R5381 Other malaise: Secondary | ICD-10-CM

## 2012-09-07 DIAGNOSIS — R5383 Other fatigue: Secondary | ICD-10-CM

## 2012-09-07 MED ORDER — ROSUVASTATIN CALCIUM 10 MG PO TABS: 10.0000 mg | ORAL_TABLET | Freq: Every day | ORAL | Status: DC

## 2012-09-07 NOTE — Assessment & Plan Note (Signed)
No obvious cause of initial testing. She has history of snoring and daytime somnolence. Arrange in-home sleep study.

## 2012-09-07 NOTE — Assessment & Plan Note (Signed)
LDL is significantly elevated. Patient is also smoker. Start Crestor 10 mg once daily. She had difficulty in the past with tolerating Lipitor secondary to myalgias.

## 2012-09-07 NOTE — Assessment & Plan Note (Signed)
Mild transaminitis likely secondary to fatty liver. Genetic testing for hemochromatosis was negative. Rule out chronic hepatitis. Patient encouraged to lose weight and we discussed dietary changes.

## 2012-09-07 NOTE — Progress Notes (Signed)
  Subjective:    Patient ID: [name], female    DOB: 19-Aug-1961, 51 y.o.   MRN: [id number]  HPI  51 year old white female for followup regarding chronic fatigue. Recent blood work was unrevealing other than mild elevation of her liver enzymes. Chart review shows she has history of fatty liver discovered on CT of abdomen performed at 2010. Patient also reports strong family history of fatty liver. The lower family history of hemachromatosis, genetic testing was performed. This was negative.  Patient's chest x-ray was normal. She stopped smoking and is currently using E. Cigarettes.  She has persistent issues with fatigue. She has history of snoring and somnolence.  Patient's cholesterol was significantly elevated.  Review of Systems No weight change, No history of IV drug use, no history of blood transfusions  Past Medical History  Diagnosis Date  . Depression   . Chronic headaches   . Fatty liver   . Gallstones     History   Social History  . Marital Status: Married    Spouse Name: N/A    Number of Children: N/A  . Years of Education: N/A   Occupational History  . Director of compensation and benefits     [employer]   Social History Main Topics  . Smoking status: Current Every Day Smoker -- 1.0 packs/day for 32 years    Types: Cigarettes  . Smokeless tobacco: Not on file  . Alcohol Use: Yes     occa  . Drug Use: Not on file  . Sexually Active: Yes   Other Topics Concern  . 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 Appendectomy   . Abdominal hysterectomy   . Temporomandibular joint arthroplasty   . Tonsillectomy     Family History  Problem Relation Age of Onset  . Hyperlipidemia    . Heart disease Father   . Hypertension Father   . Diabetes  Mother   . Alcohol abuse Maternal Grandfather   . Arthritis    . Sudden death    . Hemochromatosis Father     Allergies  Allergen Reactions  . Amoxicillin-Pot Clavulanate Rash, Other (See Comments) and Hypertension    Extreme headache    Current Outpatient Prescriptions on File Prior to Visit  Medication Sig Dispense Refill  . rosuvastatin (CRESTOR) 10 MG tablet Take 1 tablet (10 mg total) by mouth daily.  30 tablet  3    BP 104/66  Pulse 80  Temp 98.7 F (37.1 C) (Oral)  Wt 182 lb (82.555 kg)       Objective:   Physical Exam  Constitutional: She appears well-developed and well-nourished.  Eyes: EOM are normal. Pupils are equal, round, and reactive to light. No scleral icterus.  Cardiovascular: Normal rate.   Pulmonary/Chest: Effort normal and breath sounds normal. She has no wheezes.  Abdominal: Soft. Bowel sounds are normal. There is no tenderness.       No organomegaly  Neurological: No cranial nerve deficit.  Skin: Skin is warm and dry.  Psychiatric: She has a normal mood and affect. Her behavior is normal.          Assessment & Plan:

## 2012-09-07 NOTE — Patient Instructions (Addendum)
Please complete the following lab tests before your next follow up appointment: FLP, LFT[facility] - 272.4

## 2012-09-08 LAB — HEPATITIS B SURFACE ANTIBODY,QUALITATIVE: Hep B S Ab: NEGATIVE

## 2012-09-08 LAB — HEPATITIS C ANTIBODY: HCV Ab: NEGATIVE

## 2012-09-08 LAB — ANA: Anti Nuclear Antibody(ANA): NEGATIVE

## 2012-09-08 LAB — HEPATITIS B SURFACE ANTIGEN: Hepatitis B Surface Ag: NEGATIVE

## 2012-09-09 LAB — SPEP & IFE WITH QIG
Albumin ELP: 60.6 % (ref 55.8–66.1)
Alpha-1-Globulin: 3.7 % (ref 2.9–4.9)
Alpha-2-Globulin: 10.7 % (ref 7.1–11.8)
Beta 2: 5.2 % (ref 3.2–6.5)
Beta Globulin: 6.5 % (ref 4.7–7.2)
Gamma Globulin: 13.3 % (ref 11.1–18.8)
IgA: 250 mg/dL (ref 69–380)
IgG (Immunoglobin G), Serum: 968 mg/dL (ref 690–1700)
IgM, Serum: 176 mg/dL (ref 52–322)
Total Protein, Serum Electrophoresis: 6.8 g/dL (ref 6.0–8.3)

## 2012-09-09 LAB — CERULOPLASMIN: Ceruloplasmin: 32 mg/dL (ref 20–60)

## 2012-09-22 ENCOUNTER — Encounter: Payer: Self-pay | Admitting: Internal Medicine

## 2012-09-22 ENCOUNTER — Ambulatory Visit (HOSPITAL_BASED_OUTPATIENT_CLINIC_OR_DEPARTMENT_OTHER): Payer: 59 | Attending: Internal Medicine | Admitting: Radiology

## 2012-09-22 DIAGNOSIS — R0609 Other forms of dyspnea: Secondary | ICD-10-CM | POA: Insufficient documentation

## 2012-09-22 DIAGNOSIS — G4733 Obstructive sleep apnea (adult) (pediatric): Secondary | ICD-10-CM | POA: Insufficient documentation

## 2012-09-22 DIAGNOSIS — G4700 Insomnia, unspecified: Secondary | ICD-10-CM

## 2012-09-22 DIAGNOSIS — R0989 Other specified symptoms and signs involving the circulatory and respiratory systems: Secondary | ICD-10-CM | POA: Insufficient documentation

## 2012-09-22 DIAGNOSIS — R0683 Snoring: Secondary | ICD-10-CM

## 2012-09-23 DIAGNOSIS — G4733 Obstructive sleep apnea (adult) (pediatric): Secondary | ICD-10-CM

## 2012-09-24 NOTE — Procedures (Cosign Needed)
NAMEJACORIA, KEIFFER               ACCOUNT NO.:  [id number]  MEDICAL RECORD NO.:  [id number]          PATIENT TYPE:  OUT  LOCATION:  SLEEP CENTER                 FACILITY[facility]  [facility]  PHYSICIAN:  Medea Deines D. Annamaria Boots, MD, FCCP, FACPDATE OF BIRTH:  06[birth year]17/[birth year]  DATE OF STUDY:  09/22/2012                           NOCTURNAL POLYSOMNOGRAM  REFERRING PHYSICIAN:  DOE-HYUN R YOO  This is an unattended home sleep study.  The data set for this study is being recorded in the note section of Epic.  INDICATION FOR STUDY:  Snoring and hypersomnia with sleep apnea.  EPWORTH SLEEPINESS SCORE:  13/24.  BMI 30.3, weight 182 pounds.  Height 5 feet 5 inches, neck 16 inches.  IMPRESSION: 1. Mild obstructive sleep apnea/hypopnea syndrome, AHI 7.1 per hour. 2. Moderate snoring with oxygen desaturation to a nadir of 87% with     mean saturation through the study night of 92% on room air. 3. Regular cardiac rhythm with mean heart rate 62 per minute.  RECOMMENDATIONS:  Scores in this range may respond to conservative therapy such as weight loss.  Otherwise other intervention such CPAP or an oral appliance might be appropriate.     Annisten Manchester D. Annamaria Boots, MD, [facility], [place], [place] Board of Sleep Medicine    CDY/MEDQ  D:  09/24/2012 09:32:55  T:  09/24/2012 12:31:27  Job:  [id number]

## 2012-09-26 ENCOUNTER — Ambulatory Visit
Admission: RE | Admit: 2012-09-26 | Discharge: 2012-09-26 | Disposition: A | Discharge status: Home / Self Care | Payer: 59 | Source: Ambulatory Visit | Attending: Internal Medicine | Admitting: Internal Medicine

## 2012-09-26 DIAGNOSIS — Z1231 Encounter for screening mammogram for malignant neoplasm of breast: Secondary | ICD-10-CM

## 2012-10-12 ENCOUNTER — Telehealth: Payer: Self-pay | Admitting: *Deleted

## 2012-10-12 NOTE — Telephone Encounter (Signed)
Message copied by Pearletha Forge on Wed Oct 12, 2012  8:12 AM ------      Message from: Lennox Solders      Created: Mon Oct 10, 2012  1:24 PM                   ----- Message -----         From: Rosine Abe, DO         Sent: 10/10/2012   1:09 PM           To: Lennox Solders            Has pt been contacted re: sleep study results.  If not, I suggest OV to review findings and treatment options.            Dr. Shawna Orleans

## 2012-10-12 NOTE — Telephone Encounter (Signed)
Left message on pts cell phone to call back and schedule appt to discuss sleep study

## 2012-10-14 ENCOUNTER — Ambulatory Visit (INDEPENDENT_AMBULATORY_CARE_PROVIDER_SITE_OTHER): Payer: 59 | Admitting: Internal Medicine

## 2012-10-14 ENCOUNTER — Encounter: Payer: Self-pay | Admitting: Internal Medicine

## 2012-10-14 VITALS — BP 112/72 | Temp 98.9°F | Wt 175.0 lb

## 2012-10-14 DIAGNOSIS — G4733 Obstructive sleep apnea (adult) (pediatric): Secondary | ICD-10-CM

## 2012-10-14 DIAGNOSIS — E785 Hyperlipidemia, unspecified: Secondary | ICD-10-CM

## 2012-10-14 HISTORY — DX: Obstructive sleep apnea (adult) (pediatric): G47.33

## 2012-10-14 MED ORDER — PHENTERMINE-TOPIRAMATE ER 3.75-23 MG PO CP24: 3.7500 mg | ORAL_CAPSULE | Freq: Every day | ORAL | Status: DC

## 2012-10-14 MED ORDER — PHENTERMINE-TOPIRAMATE ER 7.5-46 MG PO CP24: 7.5000 mg | ORAL_CAPSULE | Freq: Every day | ORAL | Status: DC

## 2012-10-14 NOTE — Assessment & Plan Note (Signed)
Patient does not feel she can use oral appliance. She had issues with using mouthguard for TMJ in the past. Patient is interested in working towards weight loss. We discussed using Qsymia.  Start low dose for 2 wks than titrate to 7.5/46 mg.  We discussed potential side effects. Reassess in 1 month.

## 2012-10-14 NOTE — Patient Instructions (Addendum)
Please complete the following lab tests before your next follow up appointment: [place] - 272.4

## 2012-10-14 NOTE — Assessment & Plan Note (Signed)
Patient couldn't tolerate Crestor due to GI side effects. She experienced abdominal pain/burning. She is currently trying an over-the-counter supplement (cinnamon bark).  Reassess FLP in 1 month.  Patient understands I would prefer paitent try another statin but she would like to try supplement first.

## 2012-10-14 NOTE — Progress Notes (Signed)
  Subjective:    Patient ID: [name], female    DOB: 1961/05/28, 51 y.o.   MRN: [id number]  HPI  51 year old white female with history of tobacco use, fatty liver and chronic fatigue for followup. Patient underwent sleep testing. It showed mild obstructive sleep apnea/orthopnea syndrome with AHI score of 7.1 per hour. Patient noted to have moderate snoring with oxygen desaturation to a nadir of 87% with mean saturation to the study night of 92% on room air.  We discussed possibly using oral appliance however patient had issues with using a mouth guard in the past for TMJ. She does not want to consider using oral appliance.  She has gained approximately 20 pounds over last 6 months. She is interested in weight loss.  Review of Systems Negative for chest pain  Past Medical History  Diagnosis Date  . Depression   . Chronic headaches   . Fatty liver   . Gallstones     History   Social History  . Marital Status: Married    Spouse Name: N/A    Number of Children: N/A  . Years of Education: N/A   Occupational History  . Director of compensation and benefits     [employer]   Social History Main Topics  . Smoking status: Current Every Day Smoker -- 1.0 packs/day for 32 years    Types: Cigarettes  . Smokeless tobacco: Not on file  . Alcohol Use: Yes     Comment: occa  . Drug Use: Not on file  . Sexually Active: Yes   Other Topics Concern  . 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 Appendectomy   . Abdominal hysterectomy   . Temporomandibular joint arthroplasty   . Tonsillectomy     Family History  Problem Relation Age of Onset  . Hyperlipidemia    . Heart disease Father   . Hypertension Father   . Diabetes Mother   . Alcohol abuse Maternal Grandfather    . Arthritis    . Sudden death    . Hemochromatosis Father     Allergies  Allergen Reactions  . Amoxicillin-Pot Clavulanate Rash, Other (See Comments) and Hypertension    Extreme headache    Current Outpatient Prescriptions on File Prior to Visit  Medication Sig Dispense Refill  . Phentermine-Topiramate (QSYMIA) 3.75-23 MG CP24 Take 3.75-23 mg by mouth daily.  14 capsule  0    BP 112/72  Temp 98.9 F (37.2 C) (Oral)  Wt 175 lb (79.379 kg)       Objective:   Physical Exam  Constitutional: She is oriented to person, place, and time. She appears well-developed and well-nourished.  Cardiovascular: Normal rate, regular rhythm and normal heart sounds.   Pulmonary/Chest: Effort normal and breath sounds normal. She has no wheezes.  Neurological: She is alert and oriented to person, place, and time. No cranial nerve deficit.  Skin: Skin is warm and dry.  Psychiatric: She has a normal mood and affect. Her behavior is normal.          Assessment & Plan:

## 2012-12-01 ENCOUNTER — Emergency Department (HOSPITAL_BASED_OUTPATIENT_CLINIC_OR_DEPARTMENT_OTHER)
Admission: EM | Admit: 2012-12-01 | Discharge: 2012-12-01 | Disposition: A | Discharge status: Home / Self Care | Payer: 59 | Attending: Emergency Medicine | Admitting: Emergency Medicine

## 2012-12-01 ENCOUNTER — Encounter (HOSPITAL_BASED_OUTPATIENT_CLINIC_OR_DEPARTMENT_OTHER): Payer: Self-pay | Admitting: *Deleted

## 2012-12-01 ENCOUNTER — Emergency Department (HOSPITAL_BASED_OUTPATIENT_CLINIC_OR_DEPARTMENT_OTHER): Payer: 59

## 2012-12-01 DIAGNOSIS — Z8719 Personal history of other diseases of the digestive system: Secondary | ICD-10-CM | POA: Insufficient documentation

## 2012-12-01 DIAGNOSIS — R112 Nausea with vomiting, unspecified: Secondary | ICD-10-CM | POA: Insufficient documentation

## 2012-12-01 DIAGNOSIS — Z8679 Personal history of other diseases of the circulatory system: Secondary | ICD-10-CM | POA: Insufficient documentation

## 2012-12-01 DIAGNOSIS — K3189 Other diseases of stomach and duodenum: Secondary | ICD-10-CM | POA: Insufficient documentation

## 2012-12-01 DIAGNOSIS — Z79899 Other long term (current) drug therapy: Secondary | ICD-10-CM | POA: Insufficient documentation

## 2012-12-01 DIAGNOSIS — R109 Unspecified abdominal pain: Secondary | ICD-10-CM | POA: Insufficient documentation

## 2012-12-01 DIAGNOSIS — K30 Functional dyspepsia: Secondary | ICD-10-CM

## 2012-12-01 DIAGNOSIS — F17200 Nicotine dependence, unspecified, uncomplicated: Secondary | ICD-10-CM | POA: Insufficient documentation

## 2012-12-01 LAB — CBC WITH DIFFERENTIAL/PLATELET
Basophils Absolute: 0 10*3/uL (ref 0.0–0.1)
Basophils Relative: 0 % (ref 0–1)
Eosinophils Absolute: 0.2 10*3/uL (ref 0.0–0.7)
Eosinophils Relative: 2 % (ref 0–5)
HCT: 38.8 % (ref 36.0–46.0)
Hemoglobin: 13.2 g/dL (ref 12.0–15.0)
Lymphocytes Relative: 46 % (ref 12–46)
Lymphs Abs: 4.3 10*3/uL — ABNORMAL HIGH (ref 0.7–4.0)
MCH: 32 pg (ref 26.0–34.0)
MCHC: 34 g/dL (ref 30.0–36.0)
MCV: 93.9 fL (ref 78.0–100.0)
Monocytes Absolute: 0.5 10*3/uL (ref 0.1–1.0)
Monocytes Relative: 6 % (ref 3–12)
Neutro Abs: 4.3 10*3/uL (ref 1.7–7.7)
Neutrophils Relative %: 46 % (ref 43–77)
Platelets: 248 10*3/uL (ref 150–400)
RBC: 4.13 MIL/uL (ref 3.87–5.11)
RDW: 12.9 % (ref 11.5–15.5)
WBC: 9.3 10*3/uL (ref 4.0–10.5)

## 2012-12-01 LAB — URINALYSIS, ROUTINE W REFLEX MICROSCOPIC
Bilirubin Urine: NEGATIVE
Glucose, UA: NEGATIVE mg/dL
Hgb urine dipstick: NEGATIVE
Ketones, ur: NEGATIVE mg/dL
Leukocytes, UA: NEGATIVE
Nitrite: NEGATIVE
Protein, ur: NEGATIVE mg/dL
Specific Gravity, Urine: 1.013 (ref 1.005–1.030)
Urobilinogen, UA: 0.2 mg/dL (ref 0.0–1.0)
pH: 5.5 (ref 5.0–8.0)

## 2012-12-01 LAB — COMPREHENSIVE METABOLIC PANEL
ALT: 45 U/L — ABNORMAL HIGH (ref 0–35)
AST: 29 U/L (ref 0–37)
Albumin: 4 g/dL (ref 3.5–5.2)
Alkaline Phosphatase: 72 U/L (ref 39–117)
BUN: 12 mg/dL (ref 6–23)
CO2: 24 mEq/L (ref 19–32)
Calcium: 9.2 mg/dL (ref 8.4–10.5)
Chloride: 100 mEq/L (ref 96–112)
Creatinine, Ser: 1 mg/dL (ref 0.50–1.10)
GFR calc Af Amer: 74 mL/min — ABNORMAL LOW (ref >90)
GFR calc non Af Amer: 64 mL/min — ABNORMAL LOW (ref >90)
Glucose, Bld: 114 mg/dL — ABNORMAL HIGH (ref 70–99)
Potassium: 3.9 mEq/L (ref 3.5–5.1)
Sodium: 137 mEq/L (ref 135–145)
Total Bilirubin: 0.1 mg/dL — ABNORMAL LOW (ref 0.3–1.2)
Total Protein: 7.1 g/dL (ref 6.0–8.3)

## 2012-12-01 LAB — LIPASE, BLOOD: Lipase: 21 U/L (ref 11–59)

## 2012-12-01 MED ORDER — SODIUM CHLORIDE 0.9 % IV BOLUS (SEPSIS)
1000.0000 mL | Freq: Once | INTRAVENOUS | Status: AC
  Administered 2012-12-01: 1000 mL via INTRAVENOUS

## 2012-12-01 MED ORDER — DICYCLOMINE HCL 20 MG PO TABS: 20.0000 mg | ORAL_TABLET | Freq: Two times a day (BID) | ORAL | Status: DC

## 2012-12-01 MED ORDER — DICYCLOMINE HCL 10 MG/ML IM SOLN
20.0000 mg | Freq: Once | INTRAMUSCULAR | Status: AC
  Administered 2012-12-01: 20 mg via INTRAMUSCULAR
  Filled 2012-12-01: qty 2

## 2012-12-01 MED ORDER — ONDANSETRON HCL 4 MG/2ML IJ SOLN
4.0000 mg | Freq: Once | INTRAMUSCULAR | Status: AC
  Administered 2012-12-01: 4 mg via INTRAVENOUS
  Filled 2012-12-01: qty 2

## 2012-12-01 MED ORDER — MORPHINE SULFATE 4 MG/ML IJ SOLN: 4.0000 mg | Freq: Once | INTRAMUSCULAR | Status: DC

## 2012-12-01 MED ORDER — SIMETHICONE 80 MG PO CHEW: 80.0000 mg | CHEWABLE_TABLET | Freq: Four times a day (QID) | ORAL | Status: DC | PRN

## 2012-12-01 NOTE — ED Notes (Signed)
Pt c/o constipation x 2 days

## 2012-12-01 NOTE — ED Provider Notes (Signed)
History     CSN: [id number]  Arrival date & time 12/01/12  50   First MD Initiated Contact with Patient 12/01/12 1603      Chief Complaint  Patient presents with  . Constipation    (Consider location/radiation/quality/duration/timing/severity/associated sxs/prior treatment) HPI Pt with diffuse abd cramping x 2 hours after eating fast food lunch with one episode of vomiting. Pt last had BM at 1330 today described as "pebbles". No fever or chills. No urinary symptoms. Multiple previous abd surgeries and has had similar symptoms after eating in the past.  Past Medical History  Diagnosis Date  . Depression   . Chronic headaches   . Fatty liver   . Gallstones     Past Surgical History  Procedure Date  . Appendectomy   . Abdominal hysterectomy   . Temporomandibular joint arthroplasty   . Tonsillectomy     Family History  Problem Relation Age of Onset  . Hyperlipidemia    . Heart disease Father   . Hypertension Father   . Diabetes Mother   . Alcohol abuse Maternal Grandfather   . Arthritis    . Sudden death    . Hemochromatosis Father     History  Substance Use Topics  . Smoking status: Current Every Day Smoker -- 1.0 packs/day for 32 years    Types: Cigarettes  . Smokeless tobacco: Not on file  . Alcohol Use: Yes     Comment: occa    OB History    Grav Para Term Preterm Abortions TAB SAB Ect Mult Living                  Review of Systems  Constitutional: Negative for fever and chills.  Respiratory: Negative for shortness of breath.   Cardiovascular: Negative for chest pain.  Gastrointestinal: Positive for nausea, vomiting, abdominal pain and constipation. Negative for diarrhea.  Genitourinary: Negative for dysuria and flank pain.  Musculoskeletal: Negative for back pain.  Skin: Negative for rash.  All other systems reviewed and are negative.    Allergies  Amoxicillin-pot clavulanate  Home Medications   Current Outpatient Rx  Name  Route  Sig   Dispense  Refill  . DICYCLOMINE HCL 20 MG PO TABS   Oral   Take 1 tablet (20 mg total) by mouth 2 (two) times daily.   20 tablet   0   . HYDROCODONE-IBUPROFEN 7.5-200 MG PO TABS   Oral   Take 1 tablet by mouth Every 6 hours.         . METHYLPREDNISOLONE (PAK) 4 MG PO TABS   Oral   Take 1 tablet by mouth daily.         Marland Kitchen PHENTERMINE-TOPIRAMATE 3.75-23 MG PO CP24   Oral   Take 3.75-23 mg by mouth daily.   14 capsule   0   . PHENTERMINE-TOPIRAMATE 7.5-46 MG PO CP24   Oral   Take 7.5-46 mg by mouth daily.   30 capsule   1   . SIMETHICONE 80 MG PO CHEW   Oral   Chew 1 tablet (80 mg total) by mouth every 6 (six) hours as needed for flatulence.   30 tablet   0     BP 141/78  Pulse 81  Temp 97.9 F (36.6 C) (Oral)  Resp 18  Ht 5' 5"  (1.651 m)  Wt 181 lb (82.101 kg)  BMI 30.12 kg/m2  SpO2 98%  Physical Exam  Nursing note and vitals reviewed. Constitutional: She is oriented to person,  place, and time. She appears well-developed and well-nourished. No distress.  HENT:  Head: Normocephalic and atraumatic.  Mouth/Throat: Oropharynx is clear and moist.  Eyes: EOM are normal. Pupils are equal, round, and reactive to light.  Neck: Normal range of motion. Neck supple.  Cardiovascular: Normal rate and regular rhythm.   Pulmonary/Chest: Effort normal and breath sounds normal. No respiratory distress. She has no wheezes. She has no rales.  Abdominal: Soft. She exhibits distension. She exhibits no mass. There is tenderness (diffuse tenderness with voluntary guarding. No focal tenferness over GB. ). There is no rebound.       Decreased BS  Musculoskeletal: Normal range of motion. She exhibits no edema and no tenderness.  Neurological: She is alert and oriented to person, place, and time.  Skin: Skin is warm and dry. No rash noted. No erythema.  Psychiatric: She has a normal mood and affect. Her behavior is normal.    ED Course  Procedures (including critical care  time)  Labs Reviewed  COMPREHENSIVE METABOLIC PANEL - Abnormal; Notable for the following:    Glucose, Bld 114 (*)     ALT 45 (*)     Total Bilirubin 0.1 (*)     GFR calc non Af Amer 64 (*)     GFR calc Af Amer 74 (*)     All other components within normal limits  CBC WITH DIFFERENTIAL - Abnormal; Notable for the following:    Lymphs Abs 4.3 (*)     All other components within normal limits  URINALYSIS, ROUTINE W REFLEX MICROSCOPIC  LIPASE, BLOOD   Dg Abd Acute W/chest  12/01/2012  *RADIOLOGY REPORT*  Clinical Data: Abdominal pain, vomiting.  ACUTE ABDOMEN SERIES (ABDOMEN 2 VIEW & CHEST 1 VIEW)  Comparison: 08/16/2012  Findings: The bowel gas pattern is normal.  There is no evidence of free intraperitoneal air.  No suspicious radio-opaque calculi or other significant radiographic abnormality is seen. Heart size and mediastinal contours are within normal limits.  Both lungs are clear.  IMPRESSION: No acute findings.   Original Report Authenticated By: Rolm Baptise, M.D.      1. Indigestion       MDM  Pt is lying comfortably in bed. Discomfort is abated. No vomiting in ED. Benign abd. Normal labs and VS. Given return instructions. Pt voiced understanding and agrees with plan.         Julianne Rice, MD 12/01/12 [phone]

## 2012-12-01 NOTE — ED Notes (Signed)
#  20G angiocath d/c'd from right AC.  Site unremarkable.

## 2013-08-21 ENCOUNTER — Ambulatory Visit (INDEPENDENT_AMBULATORY_CARE_PROVIDER_SITE_OTHER): Payer: 59 | Admitting: Internal Medicine

## 2013-08-21 ENCOUNTER — Encounter: Payer: Self-pay | Admitting: Internal Medicine

## 2013-08-21 VITALS — BP 114/72 | Temp 98.0°F | Ht 65.0 in | Wt 177.0 lb

## 2013-08-21 DIAGNOSIS — M545 Low back pain, unspecified: Secondary | ICD-10-CM

## 2013-08-21 DIAGNOSIS — M5416 Radiculopathy, lumbar region: Secondary | ICD-10-CM

## 2013-08-21 DIAGNOSIS — IMO0002 Reserved for concepts with insufficient information to code with codable children: Secondary | ICD-10-CM

## 2013-08-21 DIAGNOSIS — Z0000 Encounter for general adult medical examination without abnormal findings: Secondary | ICD-10-CM

## 2013-08-21 DIAGNOSIS — Z23 Encounter for immunization: Secondary | ICD-10-CM

## 2013-08-21 MED ORDER — PREDNISONE 20 MG PO TABS: ORAL_TABLET | ORAL | Status: DC

## 2013-08-21 MED ORDER — HYDROCODONE-ACETAMINOPHEN 5-325 MG PO TABS: 1.0000 | ORAL_TABLET | Freq: Three times a day (TID) | ORAL | Status: DC | PRN

## 2013-08-21 NOTE — Assessment & Plan Note (Signed)
52 year old white female with significant exacerbation of chronic low back pain. She has radicular symptoms. She has pain in her left great toe. Patient may have nerve root compression between L5-S1. Obtain MRI of lumbosacral spine. Use hydrocodone for pain. Also use prednisone taper as directed.  Patient advised to avoid any further chiropractic treatments.

## 2013-08-21 NOTE — Patient Instructions (Addendum)
Please complete the following lab tests before your next follow up appointment: BMET, FLP, LFTs, TSH, CBCD - 272.4, 790.4 No lifting for next 2 weeks Please contact our office if your symptoms do not improve or gets worse.

## 2013-08-21 NOTE — Progress Notes (Signed)
Subjective:    Patient ID: [name], female    DOB: November 29, 1960, 52 y.o.   MRN: [id number]  HPI  52 year old white female with history of intermittent low back pain complains of exacerbation. Patient "stepped in a hole" 2 weeks ago in her back pain has been progressively getting worse. She has been seeing a chiropractor for spinal manipulation. She reports she had her lumbar spine adjusted by chiropractor on Wednesday night last week. 2 days later patient started to experience left toe tingling and pain. She rates severity of left-sided low back pain is 8/10. It is usually 4-5. Next  Her low back pain is exacerbated by any kind of bending or lifting. Laying on her back seems to improve her pain and also laying on her right side.  MRI from 2011 of lumbar spine reviewed. Patient noted to have mild inflammatory changes in the left psoas muscle and superior left L3 endplate appear degenerative in etiology. No significant lumbar stenosis.  Review of Systems Negative for lower extremity weakness. Negative for bladder or bowel incontinence.  Negative for fever or chills    Past Medical History  Diagnosis Date  . Depression   . Chronic headaches   . Fatty liver   . Gallstones     History   Social History  . Marital Status: Married    Spouse Name: N/A    Number of Children: N/A  . Years of Education: N/A   Occupational History  . Director of compensation and benefits     [employer]   Social History Main Topics  . Smoking status: Current Every Day Smoker -- 1.00 packs/day for 32 years    Types: Cigarettes  . Smokeless tobacco: Not on file  . Alcohol Use: Yes     Comment: occa  . Drug Use: Not on file  . Sexual Activity: Yes   Other Topics Concern  . Not on file   Social History Narrative   She is originally from [place], but grew up in [place].   She works as Information systems manager    She has been married 12 years.   No children    Past  Surgical History  Procedure Laterality Date  . Appendectomy    . Abdominal hysterectomy    . Temporomandibular joint arthroplasty    . Tonsillectomy      Family History  Problem Relation Age of Onset  . Hyperlipidemia    . Heart disease Father   . Hypertension Father   . Diabetes Mother   . Alcohol abuse Maternal Grandfather   . Arthritis    . Sudden death    . Hemochromatosis Father     Allergies  Allergen Reactions  . Amoxicillin-Pot Clavulanate Rash, Other (See Comments) and Hypertension    Extreme headache    No current outpatient prescriptions on file prior to visit.   No current facility-administered medications on file prior to visit.    BP 114/72  Temp(Src) 98 F (36.7 C) (Oral)  Ht 5' 5"  (1.651 m)  Wt 177 lb (80.287 kg)  BMI 29.45 kg/m2    Objective:   Physical Exam  Constitutional: She is oriented to person, place, and time. She appears well-developed and well-nourished. No distress.  HENT:  Head: Normocephalic and atraumatic.  Cardiovascular: Normal rate, regular rhythm and normal heart sounds.   No murmur heard. Pulmonary/Chest: Effort normal and breath sounds normal. She has no wheezes.  Abdominal: Soft. Bowel sounds are normal.  No  flank tenderness  Musculoskeletal: She exhibits no edema.  Neurological: She is alert and oriented to person, place, and time. She exhibits normal muscle tone.  Mild decrease in achilles reflex on left side.  Left great toe and top of foot tender to palpation  Skin: Skin is warm and dry. No rash noted.  Psychiatric: She has a normal mood and affect. Her behavior is normal.          Assessment & Plan:

## 2013-08-24 ENCOUNTER — Ambulatory Visit
Admission: RE | Admit: 2013-08-24 | Discharge: 2013-08-24 | Disposition: A | Discharge status: Home / Self Care | Payer: 59 | Source: Ambulatory Visit | Attending: Internal Medicine | Admitting: Internal Medicine

## 2013-08-24 DIAGNOSIS — M5416 Radiculopathy, lumbar region: Secondary | ICD-10-CM

## 2013-08-31 ENCOUNTER — Other Ambulatory Visit (INDEPENDENT_AMBULATORY_CARE_PROVIDER_SITE_OTHER): Payer: 59

## 2013-08-31 DIAGNOSIS — K769 Liver disease, unspecified: Secondary | ICD-10-CM

## 2013-08-31 DIAGNOSIS — E785 Hyperlipidemia, unspecified: Secondary | ICD-10-CM

## 2013-08-31 DIAGNOSIS — I10 Essential (primary) hypertension: Secondary | ICD-10-CM

## 2013-08-31 DIAGNOSIS — E039 Hypothyroidism, unspecified: Secondary | ICD-10-CM

## 2013-08-31 LAB — HEPATIC FUNCTION PANEL
ALT: 60 U/L — ABNORMAL HIGH (ref 0–35)
AST: 41 U/L — ABNORMAL HIGH (ref 0–37)
Albumin: 4.2 g/dL (ref 3.5–5.2)
Alkaline Phosphatase: 56 U/L (ref 39–117)
Bilirubin, Direct: 0 mg/dL (ref 0.0–0.3)
Total Bilirubin: 0.4 mg/dL (ref 0.3–1.2)
Total Protein: 7.8 g/dL (ref 6.0–8.3)

## 2013-08-31 LAB — CBC WITH DIFFERENTIAL/PLATELET
Basophils Absolute: 0 10*3/uL (ref 0.0–0.1)
Basophils Relative: 0.3 % (ref 0.0–3.0)
Eosinophils Absolute: 0.1 10*3/uL (ref 0.0–0.7)
Eosinophils Relative: 1.5 % (ref 0.0–5.0)
HCT: 40.1 % (ref 36.0–46.0)
Hemoglobin: 14 g/dL (ref 12.0–15.0)
Lymphocytes Relative: 41.7 % (ref 12.0–46.0)
Lymphs Abs: 3.2 10*3/uL (ref 0.7–4.0)
MCHC: 34.9 g/dL (ref 30.0–36.0)
MCV: 90.8 fl (ref 78.0–100.0)
Monocytes Absolute: 0.4 10*3/uL (ref 0.1–1.0)
Monocytes Relative: 4.8 % (ref 3.0–12.0)
Neutro Abs: 4 10*3/uL (ref 1.4–7.7)
Neutrophils Relative %: 51.7 % (ref 43.0–77.0)
Platelets: 248 10*3/uL (ref 150.0–400.0)
RBC: 4.42 Mil/uL (ref 3.87–5.11)
RDW: 12.6 % (ref 11.5–14.6)
WBC: 7.8 10*3/uL (ref 4.5–10.5)

## 2013-08-31 LAB — BASIC METABOLIC PANEL
BUN: 18 mg/dL (ref 6–23)
CO2: 27 mEq/L (ref 19–32)
Calcium: 9.5 mg/dL (ref 8.4–10.5)
Chloride: 103 mEq/L (ref 96–112)
Creatinine, Ser: 0.9 mg/dL (ref 0.4–1.2)
GFR: 68.58 mL/min (ref >60.00)
Glucose, Bld: 104 mg/dL — ABNORMAL HIGH (ref 70–99)
Potassium: 4.8 mEq/L (ref 3.5–5.1)
Sodium: 138 mEq/L (ref 135–145)

## 2013-08-31 LAB — TSH: TSH: 0.52 u[IU]/mL (ref 0.35–5.50)

## 2013-08-31 LAB — LIPID PANEL
Cholesterol: 291 mg/dL — ABNORMAL HIGH (ref 0–200)
HDL: 51.6 mg/dL (ref >39.00)
Total CHOL/HDL Ratio: 6
Triglycerides: 188 mg/dL — ABNORMAL HIGH (ref 0.0–149.0)
VLDL: 37.6 mg/dL (ref 0.0–40.0)

## 2013-08-31 LAB — LDL CHOLESTEROL, DIRECT: Direct LDL: 218.6 mg/dL

## 2013-09-05 ENCOUNTER — Ambulatory Visit: Payer: 59 | Admitting: Internal Medicine

## 2013-09-08 ENCOUNTER — Encounter: Payer: Self-pay | Admitting: Internal Medicine

## 2013-09-08 ENCOUNTER — Ambulatory Visit (INDEPENDENT_AMBULATORY_CARE_PROVIDER_SITE_OTHER): Payer: 59 | Admitting: Internal Medicine

## 2013-09-08 VITALS — BP 112/64 | Temp 98.4°F | Wt 178.0 lb

## 2013-09-08 DIAGNOSIS — M545 Low back pain, unspecified: Secondary | ICD-10-CM

## 2013-09-08 DIAGNOSIS — L989 Disorder of the skin and subcutaneous tissue, unspecified: Secondary | ICD-10-CM | POA: Insufficient documentation

## 2013-09-08 DIAGNOSIS — E785 Hyperlipidemia, unspecified: Secondary | ICD-10-CM

## 2013-09-08 DIAGNOSIS — K7689 Other specified diseases of liver: Secondary | ICD-10-CM

## 2013-09-08 MED ORDER — ATORVASTATIN CALCIUM 40 MG PO TABS: 40.0000 mg | ORAL_TABLET | Freq: Every day | ORAL | Status: DC

## 2013-09-08 MED ORDER — ROSUVASTATIN CALCIUM 20 MG PO TABS: 20.0000 mg | ORAL_TABLET | Freq: Every day | ORAL | Status: DC

## 2013-09-08 MED ORDER — SIMVASTATIN 40 MG PO TABS: 40.0000 mg | ORAL_TABLET | Freq: Every day | ORAL | Status: DC

## 2013-09-08 NOTE — Assessment & Plan Note (Signed)
Patient has suspicious skin lesion of left cheek. Refer to dermatology for further evaluation and possible biopsy.  Considering her fair complexion, I suggested full body skin screening.

## 2013-09-08 NOTE — Assessment & Plan Note (Signed)
Monitor LFTs

## 2013-09-08 NOTE — Patient Instructions (Signed)
Please complete the following lab tests before your next follow up appointment: FLP, LFTs - 272.4 A1c - 790.29

## 2013-09-08 NOTE — Progress Notes (Signed)
Subjective:    Patient ID: [name], female    DOB: April 14, 1961, 52 y.o.   MRN: [id number]  HPI  52 year old white female previously seen for possible lumbar radiculopathy, hyperlipidemia and tobacco use for followup.  Patient reports her low back pain radiating to left leg has improved. She finished course of prednisone. Reviewed her lumbar MRI. It was negative for any significant spinal stenosis or foraminal stenosis. Previous MRI in 2011 showed some endplate edema at L3. This has improved.  We reviewed her lipid panel. Her LDL is greater than 200. We calculated her 10 year cardiovascular risk at 10.5%. She try Crestor in the past but had to discontinue due to gastrointestinal side effects. Next  History of fatty liver-her LFTs are mildly elevated. Patient does not drink any significant alcohol.  Review of Systems Negative for chest pain. No SOB.  GI upset with Crestor in the past.    Past Medical History  Diagnosis Date  . Depression   . Chronic headaches   . Fatty liver   . Gallstones     History   Social History  . Marital Status: Married    Spouse Name: N/A    Number of Children: N/A  . Years of Education: N/A   Occupational History  . Director of compensation and benefits     [employer]   Social History Main Topics  . Smoking status: Current Every Day Smoker -- 1.00 packs/day for 32 years    Types: Cigarettes  . Smokeless tobacco: Not on file  . Alcohol Use: Yes     Comment: occa  . Drug Use: Not on file  . Sexual Activity: Yes   Other Topics Concern  . Not on file   Social History Narrative   She is originally from [place], but grew up in [place].   She works as Information systems manager    She has been married 12 years.   No children    Past Surgical History  Procedure Laterality Date  . Appendectomy    . Abdominal hysterectomy    . Temporomandibular joint arthroplasty    . Tonsillectomy      Family History   Problem Relation Age of Onset  . Hyperlipidemia    . Heart disease Father   . Hypertension Father   . Diabetes Mother   . Alcohol abuse Maternal Grandfather   . Arthritis    . Sudden death    . Hemochromatosis Father     Allergies  Allergen Reactions  . Amoxicillin-Pot Clavulanate Rash, Other (See Comments) and Hypertension    Extreme headache    No current outpatient prescriptions on file prior to visit.   No current facility-administered medications on file prior to visit.    BP 112/64  Temp(Src) 98.4 F (36.9 C) (Oral)  Wt 178 lb (80.74 kg)  BMI 29.62 kg/m2    Objective:   Physical Exam  Constitutional: She is oriented to person, place, and time. She appears well-developed and well-nourished. No distress.  HENT:  Head: Normocephalic and atraumatic.  Right Ear: External ear normal.  Left Ear: External ear normal.  Neck: Neck supple.  No carotid bruit  Cardiovascular: Normal rate, regular rhythm, normal heart sounds and intact distal pulses.   No murmur heard. Pulmonary/Chest: Effort normal and breath sounds normal. She has no wheezes.  Musculoskeletal: She exhibits no edema.  Neurological: She is alert and oriented to person, place, and time.  Skin: Skin is warm and dry.  1 cm oblong raised skin lesion left cheek  Psychiatric: She has a normal mood and affect. Her behavior is normal.          Assessment & Plan:

## 2013-09-08 NOTE — Assessment & Plan Note (Signed)
MRI of lumbar spine negative for spinal stenosis or foraminal stenosis. Patient advised to perform home exercises. Avoid spinal manipulation as it made her back pain worse.

## 2013-09-08 NOTE — Assessment & Plan Note (Signed)
Switch to simvastatin 40 mg once daily.  Arrange followup lipid panel and LFTs.

## 2013-10-02 ENCOUNTER — Other Ambulatory Visit: Payer: Self-pay | Admitting: Internal Medicine

## 2013-10-02 ENCOUNTER — Ambulatory Visit
Admission: RE | Admit: 2013-10-02 | Discharge: 2013-10-02 | Disposition: A | Discharge status: Home / Self Care | Payer: 59 | Source: Ambulatory Visit | Attending: Internal Medicine | Admitting: Internal Medicine

## 2013-10-02 ENCOUNTER — Ambulatory Visit: Admission: RE | Admit: 2013-10-02 | Payer: 59 | Source: Ambulatory Visit

## 2013-10-02 DIAGNOSIS — Z1231 Encounter for screening mammogram for malignant neoplasm of breast: Secondary | ICD-10-CM

## 2013-11-09 HISTORY — PX: KNEE ARTHROSCOPY: SHX127

## 2013-12-01 ENCOUNTER — Encounter (HOSPITAL_BASED_OUTPATIENT_CLINIC_OR_DEPARTMENT_OTHER): Payer: Self-pay | Admitting: Emergency Medicine

## 2013-12-01 ENCOUNTER — Emergency Department (HOSPITAL_BASED_OUTPATIENT_CLINIC_OR_DEPARTMENT_OTHER)
Admission: EM | Admit: 2013-12-01 | Discharge: 2013-12-01 | Disposition: A | Discharge status: Home / Self Care | Payer: 59 | Attending: Emergency Medicine | Admitting: Emergency Medicine

## 2013-12-01 DIAGNOSIS — M5380 Other specified dorsopathies, site unspecified: Secondary | ICD-10-CM | POA: Insufficient documentation

## 2013-12-01 DIAGNOSIS — Z8659 Personal history of other mental and behavioral disorders: Secondary | ICD-10-CM | POA: Insufficient documentation

## 2013-12-01 DIAGNOSIS — M62830 Muscle spasm of back: Secondary | ICD-10-CM

## 2013-12-01 DIAGNOSIS — Z8719 Personal history of other diseases of the digestive system: Secondary | ICD-10-CM | POA: Insufficient documentation

## 2013-12-01 DIAGNOSIS — Z9089 Acquired absence of other organs: Secondary | ICD-10-CM | POA: Insufficient documentation

## 2013-12-01 DIAGNOSIS — F17200 Nicotine dependence, unspecified, uncomplicated: Secondary | ICD-10-CM | POA: Insufficient documentation

## 2013-12-01 DIAGNOSIS — G8929 Other chronic pain: Secondary | ICD-10-CM | POA: Insufficient documentation

## 2013-12-01 MED ORDER — PREDNISONE 50 MG PO TABS
60.0000 mg | ORAL_TABLET | Freq: Once | ORAL | Status: AC
  Administered 2013-12-01: 60 mg via ORAL
  Filled 2013-12-01 (×2): qty 1

## 2013-12-01 MED ORDER — KETOROLAC TROMETHAMINE 30 MG/ML IJ SOLN
30.0000 mg | Freq: Once | INTRAMUSCULAR | Status: AC
  Administered 2013-12-01: 30 mg via INTRAMUSCULAR
  Filled 2013-12-01: qty 1

## 2013-12-01 MED ORDER — OXYCODONE-ACETAMINOPHEN 5-325 MG PO TABS
1.0000 | ORAL_TABLET | Freq: Once | ORAL | Status: AC
  Administered 2013-12-01: 1 via ORAL
  Filled 2013-12-01: qty 1

## 2013-12-01 MED ORDER — PREDNISONE 50 MG PO TABS: 50.0000 mg | ORAL_TABLET | Freq: Every day | ORAL | Status: DC

## 2013-12-01 MED ORDER — OXYCODONE-ACETAMINOPHEN 5-325 MG PO TABS: 1.0000 | ORAL_TABLET | Freq: Four times a day (QID) | ORAL | Status: DC | PRN

## 2013-12-01 MED ORDER — CYCLOBENZAPRINE HCL 10 MG PO TABS
5.0000 mg | ORAL_TABLET | Freq: Once | ORAL | Status: AC
  Administered 2013-12-01: 5 mg via ORAL
  Filled 2013-12-01: qty 1

## 2013-12-01 NOTE — ED Notes (Signed)
Pt to triage in w/c states "my back is killing me..." pt states she has had chronic back pain "for many years" but worse today. She went to work and had so much pain that she went to a "rolfer" at noon today, described as a deep tissue massage, pain has been worse since.

## 2013-12-01 NOTE — Discharge Instructions (Signed)
Back Exercises Back exercises help treat and prevent back injuries. The goal of back exercises is to increase the strength of your abdominal and back muscles and the flexibility of your back. These exercises should be started when you no longer have back pain. Back exercises include:  Pelvic Tilt. Lie on your back with your knees bent. Tilt your pelvis until the lower part of your back is against the floor. Hold this position 5 to 10 sec and repeat 5 to 10 times.  Knee to Chest. Pull first 1 knee up against your chest and hold for 20 to 30 seconds, repeat this with the other knee, and then both knees. This may be done with the other leg straight or bent, whichever feels better.  Sit-Ups or Curl-Ups. Bend your knees 90 degrees. Start with tilting your pelvis, and do a partial, slow sit-up, lifting your trunk only 30 to 45 degrees off the floor. Take at least 2 to 3 seconds for each sit-up. Do not do sit-ups with your knees out straight. If partial sit-ups are difficult, simply do the above but with only tightening your abdominal muscles and holding it as directed.  Hip-Lift. Lie on your back with your knees flexed 90 degrees. Push down with your feet and shoulders as you raise your hips a couple inches off the floor; hold for 10 seconds, repeat 5 to 10 times.  Back arches. Lie on your stomach, propping yourself up on bent elbows. Slowly press on your hands, causing an arch in your low back. Repeat 3 to 5 times. Any initial stiffness and discomfort should lessen with repetition over time.  Shoulder-Lifts. Lie face down with arms beside your body. Keep hips and torso pressed to floor as you slowly lift your head and shoulders off the floor. Do not overdo your exercises, especially in the beginning. Exercises may cause you some mild back discomfort which lasts for a few minutes; however, if the pain is more severe, or lasts for more than 15 minutes, do not continue exercises until you see your caregiver.  Improvement with exercise therapy for back problems is slow.  See your caregivers for assistance with developing a proper back exercise program. Document Released: 12/03/2004 Document Revised: 01/18/2012 Document Reviewed: 08/27/2011 [facility] Patient Information 2014 [place]. Back Pain, Adult Low back pain is very common. About 1 in 5 people have back pain.The cause of low back pain is rarely dangerous. The pain often gets better over time.About half of people with a sudden onset of back pain feel better in just 2 weeks. About 8 in 10 people feel better by 6 weeks.  CAUSES Some common causes of back pain include:  Strain of the muscles or ligaments supporting the spine.  Wear and tear (degeneration) of the spinal discs.  Arthritis.  Direct injury to the back. DIAGNOSIS Most of the time, the direct cause of low back pain is not known.However, back pain can be treated effectively even when the exact cause of the pain is unknown.Answering your caregiver's questions about your overall health and symptoms is one of the most accurate ways to make sure the cause of your pain is not dangerous. If your caregiver needs more information, he or she may order lab work or imaging tests (X-rays or MRIs).However, even if imaging tests show changes in your back, this usually does not require surgery. HOME CARE INSTRUCTIONS For many people, back pain returns.Since low back pain is rarely dangerous, it is often a condition that people can  learn to [facility] their own.   Remain active. It is stressful on the back to sit or stand in one place. Do not sit, drive, or stand in one place for more than 30 minutes at a time. Take short walks on level surfaces as soon as pain allows.Try to increase the length of time you walk each day.  Do not stay in bed.Resting more than 1 or 2 days can delay your recovery.  Do not avoid exercise or work.Your body is made to move.It is not dangerous to be active,  even though your back may hurt.Your back will likely heal faster if you return to being active before your pain is gone.  Pay attention to your body when you bend and lift. Many people have less discomfortwhen lifting if they bend their knees, keep the load close to their bodies,and avoid twisting. Often, the most comfortable positions are those that put less stress on your recovering back.  Find a comfortable position to sleep. Use a firm mattress and lie on your side with your knees slightly bent. If you lie on your back, put a pillow under your knees.  Only take over-the-counter or prescription medicines as directed by your caregiver. Over-the-counter medicines to reduce pain and inflammation are often the most helpful.Your caregiver may prescribe muscle relaxant drugs.These medicines help dull your pain so you can more quickly return to your normal activities and healthy exercise.  Put ice on the injured area.  Put ice in a plastic bag.  Place a towel between your skin and the bag.  Leave the ice on for 15-20 minutes, 03-04 times a day for the first 2 to 3 days. After that, ice and heat may be alternated to reduce pain and spasms.  Ask your caregiver about trying back exercises and gentle massage. This may be of some benefit.  Avoid feeling anxious or stressed.Stress increases muscle tension and can worsen back pain.It is important to recognize when you are anxious or stressed and learn ways to manage it.Exercise is a great option. SEEK MEDICAL CARE IF:  You have pain that is not relieved with rest or medicine.  You have pain that does not improve in 1 week.  You have new symptoms.  You are generally not feeling well. SEEK IMMEDIATE MEDICAL CARE IF:   You have pain that radiates from your back into your legs.  You develop new bowel or bladder control problems.  You have unusual weakness or numbness in your arms or legs.  You develop nausea or vomiting.  You develop  abdominal pain.  You feel faint. Document Released: 10/26/2005 Document Revised: 04/26/2012 Document Reviewed: 03/16/2011 [facility] Patient Information 2014 [place], [place].

## 2013-12-01 NOTE — ED Provider Notes (Signed)
CSN: [id number]     Arrival date & time 12/01/13  1354 History   First MD Initiated Contact with Patient 12/01/13 1406     Chief Complaint  Patient presents with  . Back Pain   (Consider location/radiation/quality/duration/timing/severity/associated sxs/prior Treatment) HPI  This a 53 year old female who presents with back pain. She has a history of chronic back pain and spasm. She has been followed by her primary Dr. and had an MRI back in October. Patient reports that she's been doing well but today had acute onset of worsening pain. She denies any recent injury. She states that over lunch she went to a masseuse and her pain just got worse. She denies any weakness, numbness, or tingling of the lower extremity. Patient does report intermittent "weird sensations" of the left lower extremity which are not new.  She denies any bowel or bladder issues.  Past Medical History  Diagnosis Date  . Depression   . Chronic headaches   . Fatty liver   . Gallstones    Past Surgical History  Procedure Laterality Date  . Appendectomy    . Abdominal hysterectomy    . Temporomandibular joint arthroplasty    . Tonsillectomy     Family History  Problem Relation Age of Onset  . Hyperlipidemia    . Heart disease Father   . Hypertension Father   . Diabetes Mother   . Alcohol abuse Maternal Grandfather   . Arthritis    . Sudden death    . Hemochromatosis Father    History  Substance Use Topics  . Smoking status: Current Every Day Smoker -- 1.00 packs/day for 32 years    Types: Cigarettes  . Smokeless tobacco: Not on file  . Alcohol Use: Yes     Comment: occa   OB History   Grav Para Term Preterm Abortions TAB SAB Ect Mult Living                 Review of Systems  Constitutional: Negative for fever.  Respiratory: Negative for cough, chest tightness and shortness of breath.   Cardiovascular: Negative for chest pain.  Gastrointestinal: Negative for nausea, vomiting and abdominal pain.   Genitourinary: Negative for dysuria.       Denies urinary retention  Musculoskeletal: Positive for back pain.  Skin: Negative for wound.  Neurological: Negative for weakness, numbness and headaches.  Psychiatric/Behavioral: Negative for confusion.  All other systems reviewed and are negative.    Allergies  Amoxicillin-pot clavulanate  Home Medications   Current Outpatient Rx  Name  Route  Sig  Dispense  Refill  . oxyCODONE-acetaminophen (PERCOCET/ROXICET) 5-325 MG per tablet   Oral   Take 1 tablet by mouth every 6 (six) hours as needed for severe pain.   10 tablet   0   . predniSONE (DELTASONE) 50 MG tablet   Oral   Take 1 tablet (50 mg total) by mouth daily.   5 tablet   0   . simvastatin (ZOCOR) 40 MG tablet   Oral   Take 1 tablet (40 mg total) by mouth at bedtime.   30 tablet   3    BP 122/71  Pulse 79  Temp(Src) 98.6 F (37 C) (Oral)  Resp 18  Ht 5' 4"  (1.626 m)  Wt 175 lb (79.379 kg)  BMI 30.02 kg/m2  SpO2 98% Physical Exam  Nursing note and vitals reviewed. Constitutional: She is oriented to person, place, and time. No distress.  Uncomfortable  HENT:  Head: Normocephalic  and atraumatic.  Eyes: Pupils are equal, round, and reactive to light.  Neck: Neck supple.  Cardiovascular: Normal rate, regular rhythm and normal heart sounds.   Pulmonary/Chest: Effort normal and breath sounds normal. No respiratory distress. She has no wheezes.  Abdominal: Soft. There is no tenderness.  Musculoskeletal: Normal range of motion.  Tenderness to palpation over the paraspinous muscles of the lumbar spine, left SI joint tenderness, no midline tenderness, negative straight leg raise  Neurological: She is alert and oriented to person, place, and time.  5 out of 5 strength in the bilateral lower extremities, normal reflexes  Skin: Skin is warm and dry.  Psychiatric: She has a normal mood and affect.    ED Course  Procedures (including critical care time) Labs  Review Labs Reviewed - No data to display Imaging Review No results found.  EKG Interpretation   None       MDM   1. Muscle spasm of back    Patient presents with acute on chronic back pain. She is nontoxic on exam. She does appear uncomfortable. She has been ambulatory. I reviewed the patient's chart and she has had an MRI which was negative for acute pathology. She's had no new injury. She has no evidence of cauda equina or neurologic deficit on exam. Given the chronicity of her symptoms, we'll treat symptomatically. No indication for imaging at this time. Patient was given prednisone, Toradol, Flexeril, and Norco. She will be discharged home with prednisone and Percocet. She's to followup with her primary care physician regarding chronic pain medications.  After history, exam, and medical workup I feel the patient has been appropriately medically screened and is safe for discharge home. Pertinent diagnoses were discussed with the patient. Patient was given return precautions.     Merryl Hacker, MD 12/01/13 1524

## 2013-12-04 ENCOUNTER — Telehealth: Payer: Self-pay | Admitting: Internal Medicine

## 2013-12-04 DIAGNOSIS — M545 Low back pain, unspecified: Secondary | ICD-10-CM

## 2013-12-04 NOTE — Telephone Encounter (Signed)
Pt seen in ed on Friday. Pt would like referral for  back dr for sciatic nerve. Pt given med for 5 days. pls advise.pt would like a cb today.

## 2013-12-05 NOTE — Telephone Encounter (Signed)
Refer to [facility] re: low back pain

## 2013-12-05 NOTE — Telephone Encounter (Signed)
Referral order placed.

## 2014-02-28 ENCOUNTER — Other Ambulatory Visit: Payer: Self-pay | Admitting: Gastroenterology

## 2014-02-28 DIAGNOSIS — R112 Nausea with vomiting, unspecified: Secondary | ICD-10-CM

## 2014-03-08 ENCOUNTER — Observation Stay (HOSPITAL_BASED_OUTPATIENT_CLINIC_OR_DEPARTMENT_OTHER)
Admission: EM | Admit: 2014-03-08 | Discharge: 2014-03-11 | Disposition: A | Discharge status: Home / Self Care | Payer: 59 | Attending: General Surgery | Admitting: General Surgery

## 2014-03-08 ENCOUNTER — Emergency Department (HOSPITAL_BASED_OUTPATIENT_CLINIC_OR_DEPARTMENT_OTHER): Payer: 59

## 2014-03-08 ENCOUNTER — Encounter (HOSPITAL_BASED_OUTPATIENT_CLINIC_OR_DEPARTMENT_OTHER): Payer: Self-pay | Admitting: Emergency Medicine

## 2014-03-08 DIAGNOSIS — Z860100 Personal history of colon polyps, unspecified: Secondary | ICD-10-CM | POA: Insufficient documentation

## 2014-03-08 DIAGNOSIS — R143 Flatulence: Secondary | ICD-10-CM

## 2014-03-08 DIAGNOSIS — K7581 Nonalcoholic steatohepatitis (NASH): Secondary | ICD-10-CM | POA: Diagnosis present

## 2014-03-08 DIAGNOSIS — R142 Eructation: Secondary | ICD-10-CM

## 2014-03-08 DIAGNOSIS — K7689 Other specified diseases of liver: Secondary | ICD-10-CM | POA: Insufficient documentation

## 2014-03-08 DIAGNOSIS — K8010 Calculus of gallbladder with chronic cholecystitis without obstruction: Principal | ICD-10-CM | POA: Insufficient documentation

## 2014-03-08 DIAGNOSIS — R112 Nausea with vomiting, unspecified: Secondary | ICD-10-CM

## 2014-03-08 DIAGNOSIS — K829 Disease of gallbladder, unspecified: Secondary | ICD-10-CM

## 2014-03-08 DIAGNOSIS — E669 Obesity, unspecified: Secondary | ICD-10-CM

## 2014-03-08 DIAGNOSIS — M545 Low back pain, unspecified: Secondary | ICD-10-CM | POA: Diagnosis present

## 2014-03-08 DIAGNOSIS — K828 Other specified diseases of gallbladder: Secondary | ICD-10-CM

## 2014-03-08 DIAGNOSIS — R140 Abdominal distension (gaseous): Secondary | ICD-10-CM

## 2014-03-08 DIAGNOSIS — Z720 Tobacco use: Secondary | ICD-10-CM

## 2014-03-08 DIAGNOSIS — G4730 Sleep apnea, unspecified: Secondary | ICD-10-CM | POA: Insufficient documentation

## 2014-03-08 DIAGNOSIS — R141 Gas pain: Secondary | ICD-10-CM | POA: Insufficient documentation

## 2014-03-08 DIAGNOSIS — K8020 Calculus of gallbladder without cholecystitis without obstruction: Secondary | ICD-10-CM

## 2014-03-08 DIAGNOSIS — F17200 Nicotine dependence, unspecified, uncomplicated: Secondary | ICD-10-CM | POA: Insufficient documentation

## 2014-03-08 HISTORY — DX: Other specified diseases of liver: K76.89

## 2014-03-08 HISTORY — DX: Obstructive sleep apnea (adult) (pediatric): G47.33

## 2014-03-08 HISTORY — DX: Acute appendicitis with perforation and localized peritonitis, without abscess: K35.32

## 2014-03-08 LAB — COMPREHENSIVE METABOLIC PANEL
ALT: 50 U/L — ABNORMAL HIGH (ref 0–35)
AST: 38 U/L — ABNORMAL HIGH (ref 0–37)
Albumin: 4.1 g/dL (ref 3.5–5.2)
Alkaline Phosphatase: 63 U/L (ref 39–117)
BUN: 14 mg/dL (ref 6–23)
CO2: 27 mEq/L (ref 19–32)
Calcium: 10 mg/dL (ref 8.4–10.5)
Chloride: 99 mEq/L (ref 96–112)
Creatinine, Ser: 1 mg/dL (ref 0.50–1.10)
GFR calc Af Amer: 73 mL/min — ABNORMAL LOW (ref >90)
GFR calc non Af Amer: 63 mL/min — ABNORMAL LOW (ref >90)
Glucose, Bld: 107 mg/dL — ABNORMAL HIGH (ref 70–99)
Potassium: 4.2 mEq/L (ref 3.7–5.3)
Sodium: 138 mEq/L (ref 137–147)
Total Bilirubin: <0.2 mg/dL — ABNORMAL LOW (ref 0.3–1.2)
Total Protein: 7.5 g/dL (ref 6.0–8.3)

## 2014-03-08 LAB — CBC WITH DIFFERENTIAL/PLATELET
Basophils Absolute: 0 10*3/uL (ref 0.0–0.1)
Basophils Relative: 0 % (ref 0–1)
Eosinophils Absolute: 0.2 10*3/uL (ref 0.0–0.7)
Eosinophils Relative: 2 % (ref 0–5)
HCT: 39.8 % (ref 36.0–46.0)
Hemoglobin: 13.4 g/dL (ref 12.0–15.0)
Lymphocytes Relative: 43 % (ref 12–46)
Lymphs Abs: 5 10*3/uL — ABNORMAL HIGH (ref 0.7–4.0)
MCH: 32.2 pg (ref 26.0–34.0)
MCHC: 33.7 g/dL (ref 30.0–36.0)
MCV: 95.7 fL (ref 78.0–100.0)
Monocytes Absolute: 0.7 10*3/uL (ref 0.1–1.0)
Monocytes Relative: 6 % (ref 3–12)
Neutro Abs: 5.6 10*3/uL (ref 1.7–7.7)
Neutrophils Relative %: 48 % (ref 43–77)
Platelets: 253 10*3/uL (ref 150–400)
RBC: 4.16 MIL/uL (ref 3.87–5.11)
RDW: 12.9 % (ref 11.5–15.5)
WBC: 11.6 10*3/uL — ABNORMAL HIGH (ref 4.0–10.5)

## 2014-03-08 LAB — LIPASE, BLOOD: Lipase: 29 U/L (ref 11–59)

## 2014-03-08 MED ORDER — PROMETHAZINE HCL 25 MG/ML IJ SOLN
12.5000 mg | Freq: Once | INTRAMUSCULAR | Status: AC
  Administered 2014-03-08: 12.5 mg via INTRAVENOUS
  Filled 2014-03-08: qty 1

## 2014-03-08 MED ORDER — ONDANSETRON HCL 4 MG/2ML IJ SOLN
4.0000 mg | Freq: Once | INTRAMUSCULAR | Status: DC
  Filled 2014-03-08: qty 2

## 2014-03-08 MED ORDER — FAMOTIDINE IN NACL 20-0.9 MG/50ML-% IV SOLN
20.0000 mg | Freq: Once | INTRAVENOUS | Status: AC
  Administered 2014-03-08: 20 mg via INTRAVENOUS
  Filled 2014-03-08: qty 50

## 2014-03-08 MED ORDER — HYDROMORPHONE HCL PF 1 MG/ML IJ SOLN
1.0000 mg | Freq: Once | INTRAMUSCULAR | Status: AC
  Administered 2014-03-08: 1 mg via INTRAVENOUS
  Filled 2014-03-08: qty 1

## 2014-03-08 MED ORDER — OXYCODONE-ACETAMINOPHEN 5-325 MG PO TABS: 2.0000 | ORAL_TABLET | ORAL | Status: DC | PRN

## 2014-03-08 MED ORDER — ONDANSETRON 4 MG PO TBDP: 4.0000 mg | ORAL_TABLET | Freq: Three times a day (TID) | ORAL | Status: DC | PRN

## 2014-03-08 MED ORDER — SODIUM CHLORIDE 0.9 % IV SOLN
Freq: Once | INTRAVENOUS | Status: AC
  Administered 2014-03-08: 21:00:00 via INTRAVENOUS

## 2014-03-08 MED ORDER — ONDANSETRON HCL 4 MG/2ML IJ SOLN
4.0000 mg | Freq: Once | INTRAMUSCULAR | Status: AC
  Administered 2014-03-08: 4 mg via INTRAVENOUS

## 2014-03-08 NOTE — ED Notes (Signed)
Report given to [facility] and terri Writer at [facility]

## 2014-03-08 NOTE — ED Notes (Signed)
Pt continues to c/o nausea, pain is controlled at this time. Pt currently with dry heaves, md notified

## 2014-03-08 NOTE — ED Notes (Signed)
Gall bladder problems x5 years.  Worsening sx x1 month.  Abd bloating, pain and nausea worse over past hour.  "No worse than usual".

## 2014-03-08 NOTE — ED Provider Notes (Signed)
CSN: [id number]     Arrival date & time 03/08/14  2006 History   First MD Initiated Contact with Patient 03/08/14 2019     Chief Complaint  Patient presents with  . Abdominal Pain     (Consider location/radiation/quality/duration/timing/severity/associated sxs/prior Treatment) Patient is a 53 y.o. female presenting with abdominal pain. The history is provided by the patient. No language interpreter was used.  Abdominal Pain Pain location:  RUQ Pain quality: aching, bloating, shooting and stabbing   Pain radiates to:  Does not radiate Duration: years. Timing:  Constant Chronicity:  Recurrent Worsened by:  Vomiting and eating Ineffective treatments:  Vomiting Associated symptoms: nausea   Risk factors: no alcohol abuse     Past Medical History  Diagnosis Date  . Depression   . Chronic headaches   . Fatty liver   . Gallstones    Past Surgical History  Procedure Laterality Date  . Appendectomy    . Abdominal hysterectomy    . Temporomandibular joint arthroplasty    . Tonsillectomy     Family History  Problem Relation Age of Onset  . Hyperlipidemia    . Heart disease Father   . Hypertension Father   . Diabetes Mother   . Alcohol abuse Maternal Grandfather   . Arthritis    . Sudden death    . Hemochromatosis Father    History  Substance Use Topics  . Smoking status: Current Every Day Smoker -- 1.00 packs/day for 32 years    Types: Cigarettes  . Smokeless tobacco: Not on file  . Alcohol Use: Yes     Comment: occa   OB History   Grav Para Term Preterm Abortions TAB SAB Ect Mult Living                 Review of Systems  Gastrointestinal: Positive for nausea, abdominal pain and abdominal distention.  All other systems reviewed and are negative.     Allergies  Amoxicillin-pot clavulanate  Home Medications   Prior to Admission medications   Medication Sig Start Date End Date Taking? Authorizing Provider  ondansetron (ZOFRAN-ODT) 4 MG disintegrating  tablet Take 4 mg by mouth every 8 (eight) hours as needed for nausea or vomiting.   Yes Historical Provider, MD  oxyCODONE-acetaminophen (PERCOCET/ROXICET) 5-325 MG per tablet Take 1 tablet by mouth every 6 (six) hours as needed for severe pain. 12/01/13   Merryl Hacker, MD  predniSONE (DELTASONE) 50 MG tablet Take 1 tablet (50 mg total) by mouth daily. 12/01/13   Merryl Hacker, MD  simvastatin (ZOCOR) 40 MG tablet Take 1 tablet (40 mg total) by mouth at bedtime. 09/08/13   Doe-Hyun R Shawna Orleans, DO   BP 136/58  Pulse 74  Temp(Src) 97.9 F (36.6 C) (Oral)  Resp 18  Ht 5' 4"  (1.626 m)  Wt 180 lb (81.647 kg)  BMI 30.88 kg/m2  SpO2 100% Physical Exam  Nursing note and vitals reviewed. Constitutional: She is oriented to person, place, and time. She appears well-developed and well-nourished.  HENT:  Head: Normocephalic and atraumatic.  Eyes: Conjunctivae and EOM are normal. Pupils are equal, round, and reactive to light.  Neck: Normal range of motion.  Cardiovascular: Normal rate and normal heart sounds.   Pulmonary/Chest: Effort normal.  Abdominal: Soft. Bowel sounds are normal. She exhibits distension. There is tenderness.  Musculoskeletal: Normal range of motion.  Neurological: She is alert and oriented to person, place, and time.  Skin: Skin is warm.  Psychiatric: She has a  normal mood and affect.    ED Course  Procedures (including critical care time) Labs Review Labs Reviewed  CBC WITH DIFFERENTIAL - Abnormal; Notable for the following:    WBC 11.6 (*)    Lymphs Abs 5.0 (*)    All other components within normal limits  COMPREHENSIVE METABOLIC PANEL - Abnormal; Notable for the following:    Glucose, Bld 107 (*)    AST 38 (*)    ALT 50 (*)    Total Bilirubin <0.2 (*)    GFR calc non Af Amer 63 (*)    GFR calc Af Amer 73 (*)    All other components within normal limits  LIPASE, BLOOD    Imaging Review No results found.   EKG Interpretation None      MDM   Final  diagnoses:  Gallbladder disease    Pt's care turned over to Dr. Florina Ou awaiting results    Fransico Meadow, PA-C 03/10/14 1909

## 2014-03-08 NOTE — ED Notes (Signed)
Patient transported to Ultrasound 

## 2014-03-08 NOTE — ED Provider Notes (Signed)
Patient seen along with Marcene Brawn. Workup reveals elevated white count and mild bump in the LFTs. Patient states she continues to be nauseated and in severe pain and does not feel as though she can go home. I've discussed the case with Dr. Johney Maine from [facility] who requests I transfer the patient to the [facility] long ER where he will evaluate her there prior to agreeing to admit.  Veryl Speak, MD 03/08/14 2312

## 2014-03-08 NOTE — ED Notes (Signed)
Pt states that 5 years ago she had this exact same problem with n/v, abdominal pain, was evaluated by er told she had to have her gallbladder removed. Was seen by a surgeon who told her she did not need it removed. Since then about every three months, she would get nauseated, vomit, however it has progressively gotten worse and more frequent.

## 2014-03-09 ENCOUNTER — Encounter (HOSPITAL_COMMUNITY): Payer: Self-pay | Admitting: Surgery

## 2014-03-09 ENCOUNTER — Emergency Department (HOSPITAL_COMMUNITY): Payer: 59

## 2014-03-09 DIAGNOSIS — E669 Obesity, unspecified: Secondary | ICD-10-CM

## 2014-03-09 DIAGNOSIS — K828 Other specified diseases of gallbladder: Secondary | ICD-10-CM

## 2014-03-09 DIAGNOSIS — Z860100 Personal history of colon polyps, unspecified: Secondary | ICD-10-CM

## 2014-03-09 DIAGNOSIS — K8020 Calculus of gallbladder without cholecystitis without obstruction: Secondary | ICD-10-CM

## 2014-03-09 DIAGNOSIS — R112 Nausea with vomiting, unspecified: Secondary | ICD-10-CM

## 2014-03-09 DIAGNOSIS — R140 Abdominal distension (gaseous): Secondary | ICD-10-CM

## 2014-03-09 DIAGNOSIS — Z860101 Personal history of adenomatous and serrated colon polyps: Secondary | ICD-10-CM

## 2014-03-09 DIAGNOSIS — Z720 Tobacco use: Secondary | ICD-10-CM

## 2014-03-09 HISTORY — DX: Personal history of colonic polyps: Z86.010

## 2014-03-09 HISTORY — DX: Personal history of adenomatous and serrated colon polyps: Z86.0101

## 2014-03-09 LAB — SURGICAL PCR SCREEN
MRSA, PCR: NEGATIVE
Staphylococcus aureus: NEGATIVE

## 2014-03-09 MED ORDER — SINCALIDE 5 MCG IJ SOLR
0.0200 ug/kg | Freq: Once | INTRAMUSCULAR | Status: AC
  Administered 2014-03-09: 1.6 ug via INTRAVENOUS
  Filled 2014-03-09: qty 5

## 2014-03-09 MED ORDER — IOHEXOL 300 MG/ML  SOLN
50.0000 mL | Freq: Once | INTRAMUSCULAR | Status: AC | PRN
  Administered 2014-03-09: 50 mL via ORAL

## 2014-03-09 MED ORDER — LACTATED RINGERS IV BOLUS (SEPSIS)
1000.0000 mL | Freq: Once | INTRAVENOUS | Status: AC
  Administered 2014-03-09: 1000 mL via INTRAVENOUS

## 2014-03-09 MED ORDER — ONDANSETRON HCL 4 MG/2ML IJ SOLN: 4.0000 mg | Freq: Four times a day (QID) | INTRAMUSCULAR | Status: DC | PRN

## 2014-03-09 MED ORDER — ACETAMINOPHEN 650 MG RE SUPP: 650.0000 mg | Freq: Four times a day (QID) | RECTAL | Status: DC | PRN

## 2014-03-09 MED ORDER — LACTATED RINGERS IV BOLUS (SEPSIS)
1000.0000 mL | Freq: Three times a day (TID) | INTRAVENOUS | Status: AC | PRN
  Administered 2014-03-09: 1000 mL via INTRAVENOUS

## 2014-03-09 MED ORDER — TECHNETIUM TC 99M MEBROFENIN IV KIT
5.5000 | PACK | Freq: Once | INTRAVENOUS | Status: AC | PRN
  Administered 2014-03-09: 6 via INTRAVENOUS

## 2014-03-09 MED ORDER — HYDROMORPHONE HCL PF 1 MG/ML IJ SOLN: 1.0000 mg | INTRAMUSCULAR | Status: DC | PRN

## 2014-03-09 MED ORDER — IOHEXOL 300 MG/ML  SOLN
100.0000 mL | Freq: Once | INTRAMUSCULAR | Status: AC | PRN
  Administered 2014-03-09: 100 mL via INTRAVENOUS

## 2014-03-09 MED ORDER — ACETAMINOPHEN 325 MG PO TABS: 650.0000 mg | ORAL_TABLET | Freq: Four times a day (QID) | ORAL | Status: DC | PRN

## 2014-03-09 MED ORDER — HYDROCODONE-ACETAMINOPHEN 5-325 MG PO TABS: 1.0000 | ORAL_TABLET | ORAL | Status: DC | PRN

## 2014-03-09 MED ORDER — KCL IN DEXTROSE-NACL 20-5-0.45 MEQ/L-%-% IV SOLN
INTRAVENOUS | Status: DC
  Administered 2014-03-09: 17:00:00 via INTRAVENOUS
  Filled 2014-03-09 (×2): qty 1000

## 2014-03-09 NOTE — ED Notes (Signed)
Patient transported to CT 

## 2014-03-09 NOTE — ED Provider Notes (Signed)
12:49 AM Pt arrived from [facility] for general surgery to evaluate  Monique Morn, MD 03/09/14 [phone]

## 2014-03-09 NOTE — ED Notes (Signed)
Patient taken to nuclear med for liver scan.

## 2014-03-09 NOTE — Consult Note (Addendum)
[name], MD, [address] [place], [place] [address] Phone: [phone] FAX: [place]uncey  1961/02/07 [id number]  CARE TEAM:  PCP: Monique Pry, DO  Outpatient Care Team: Patient Care Team: Monique Kyra Searles, DO as PCP - General (Internal Medicine) Monique Banister, MD as Consulting Physician (Gastroenterology) Monique Chamber, MD as Consulting Physician (Obstetrics and Gynecology)  Inpatient Treatment Team: Treatment Team: Attending Provider: Hoy Morn, MD; Technician: [name], NT; Registered Nurse: Monique Austin, RN; Consulting Physician: Monique Nations, MD  This patient is a 53 y.o.female who presents today for surgical evaluation at the request of Monique [name], [facility] ED.   Reason for evaluation: Gallstones  Obese female that is struggled with intermittent episodes of nausea vomiting and severe abdominal distention.  She thinks this is going going on for years.  Had an appendectomy in 2009.  Seems to have struggled with these episodes since then.  Had persistent lower abdominal pain.  Felt to be due secondary to a fibroid.  Underwent hysterectomy in 2010.  Still struggled with issues.  Was seen by [facility] surgery (me?) over concern of gallstones being the etiology.  History was not consistent with that.  Recommended gastroenterology consultation to r/o other etiologies in 2011.  Underwent upper and lower endoscopy.  Some mild duodenitis - H. pylori negative.  One tubular adenoma polyp. Elevated liver tests, mildly elevated transaminases (noted winter 2010,11): Lab tests 2011: hepatitis A., B., C. all negative. Ceruloplasmin, alpha-1 antitrypsin, ANA, AMA, iron studies all normal. Imaging including CT and ultrasound had suggested fatty liver however no signs of cirrhosis.  Consideration of repeat CAT scan.  I think for financial issues it did not happen.  Since that workup years ago, that  seemed to be less bothersome.  She would get intermittent episodes of distention and nausea and vomiting every other month a few times a year.  However, things began to worsen about a month ago.  Episode after having filleting fish and french fries.  Again no pain, just severe distention and nausea and vomiting.  She usually has problems after eating.  She cannot determine what foods triggered this or not.  Does not seem to related to activity.  She can walk okay.  Apparently she is seeing another gastroenterologist ([name]) and there was discussion about getting a HIDA next week.  That has not happened yet.  Now these episodes seems to happen almost every day.  More severe episode today.  She insists that she feels no pain.  After some cross examination, she confesses that perhaps she has some discomfort but it is not that bad.  Was everywhere.  Not worse than one location.  Some discomfort along the left flank and back.  She does have a history of chronic low back pain.  No radiation to the shoulder.  She still feels rather distended  She denies heartburn/reflux.  She does have some early satiety.  She tends to have chronic constipation with small hard stools.  She has had some intermittent loose stool/diarrhea with these more frequent episodes.  She comes today with her husband.  They seem exhausted and frustrated.  Went to the emergency room on the insistence of a friend.  Ultrasound referred confirm gallstones.  Surgical consultation requested.  Transferred to [facility] for surgical consultation.  No personal nor family history of GI/colon cancer,  inflammatory bowel disease, irritable bowel syndrome, allergy such as Celiac Sprue, dietary/dairy problems, colitis, ulcers nor gastritis.  No recent sick contacts/gastroenteritis.  No travel outside the country.  No changes in diet.  No dysphagia to solids or liquids.  No significant heartburn or reflux.  No hematochezia, hematemesis, coffee ground  emesis.  No evidence of prior gastric/peptic ulceration.    Past Medical History  Diagnosis Date  . Depression   . Chronic headaches   . Fatty liver   . Gallstones     Past Surgical History  Procedure Laterality Date  . Appendectomy  2009    Monique [name]  . Abdominal hysterectomy  2010    Monique [name]  . Temporomandibular joint arthroplasty    . Tonsillectomy    . Colonoscopy w/ biopsies  2011    Monique [name]  . Esophagogastroduodenoscopy  2011    Monique [name]    History   Social History  . Marital Status: Married    Spouse Name: [name]    Number of Children: [name]  . Years of Education: [name]   Occupational History  . Director of compensation and benefits     [employer]   Social History Main Topics  . Smoking status: Current Every Day Smoker -- 1.00 packs/day for 32 years    Types: Cigarettes  . Smokeless tobacco: Not on file  . Alcohol Use: Yes     Comment: occa  . Drug Use: No  . Sexual Activity: Yes    Birth Control/ Protection: Surgical   Other Topics Concern  . Not on file   Social History Narrative   She is originally from [place], but grew up in [place].   She works as Information systems manager    She has been married 12 years.   No children    Family History  Problem Relation Age of Onset  . Hyperlipidemia    . Heart disease Father   . Hypertension Father   . Diabetes Mother   . Alcohol abuse Maternal Grandfather   . Arthritis    . Sudden death    . Hemochromatosis Father     Current Facility-Administered Medications  Medication Dose Route Frequency Provider Last Rate Last Dose  . lactated ringers bolus 1,000 mL  1,000 mL Intravenous Q8H PRN Adin Hector, MD      . lactated ringers bolus 1,000 mL  1,000 mL Intravenous Once Adin Hector, MD       Current Outpatient Prescriptions  Medication Sig Dispense Refill  . ondansetron (ZOFRAN-ODT) 4 MG disintegrating tablet Take 4 mg by mouth every 8 (eight) hours as  needed for nausea or vomiting.      . ondansetron (ZOFRAN ODT) 4 MG disintegrating tablet Take 1 tablet (4 mg total) by mouth every 8 (eight) hours as needed.  10 tablet  0  . oxyCODONE-acetaminophen (PERCOCET/ROXICET) 5-325 MG per tablet Take 2 tablets by mouth every 4 (four) hours as needed for severe pain.  15 tablet  0     Allergies  Allergen Reactions  . Shellfish Allergy Swelling    Throat & mouth swell. Her blood pressure goes up.  . Amoxicillin-Pot Clavulanate Rash, Other (See Comments) and Hypertension    Extreme headache    ROS: Constitutional:  No fevers, chills, sweats.  Weight stable Eyes:  No vision changes, No discharge HENT:  No sore throats, nasal drainage Lymph: No neck swelling, No bruising easily Pulmonary:  No cough, productive sputum  CV: No orthopnea, PND  Patient walks 20 minutes for about 1/2 miles without difficulty.  No exertional chest/neck/shoulder/arm pain. GI:  No personal nor family history of GI/colon cancer, inflammatory bowel disease, irritable bowel syndrome, allergy such as Celiac Sprue, dietary/dairy problems, colitis, ulcers nor gastritis.  No recent sick contacts/gastroenteritis.  No travel outside the country.  No changes in diet. Renal: No UTIs, No hematuria Genital:  No drainage, bleeding, masses Musculoskeletal: No severe joint pain.  Mild chronic low back painGood ROM major joints Skin:  No sores or lesions.  No rashes Heme/Lymph:  No easy bleeding.  No swollen lymph nodes Neuro: No focal weakness/numbness.  No seizures Psych: No suicidal ideation.  No hallucinations  BP 121/65  Pulse 60  Temp(Src) 98.3 F (36.8 C) (Oral)  Resp 14  Ht 5' 4"  (1.626 m)  Wt 180 lb (81.647 kg)  BMI 30.88 kg/m2  SpO2 95%  Physical Exam: General: Pt awakens oriented x4 in no major acute distress.  Initially groggy at first but then woke up better. Eyes: PERRL, normal EOM. Sclera nonicteric Neuro: CN II-XII intact w/o focal sensory/motor deficits. Lymph:  No head/neck/groin lymphadenopathy Psych:  No delerium/psychosis/paranoia HENT: Normocephalic, Mucus membranes moist.  No thrush Neck: Supple, No tracheal deviation Chest: No pain.  Good respiratory excursion. CV:  Pulses intact.  Regular rhythm Abdomen: Soft, Moderately distended.  Nontender.  No incarcerated hernias.  No Murphy's sign.  No guarding.  No evidence of peritonitis.  No rebound tenderness.  No pain with cough or bedshake. Ext:  SCDs BLE.  No significant edema.  No cyanosis Skin: No petechiae / purpurea.  No major sores Musculoskeletal: No severe joint pain.  Good ROM major joints   Results:   Labs: Results for orders placed during the hospital encounter of 03/08/14 (from the past 48 hour(s))  CBC WITH DIFFERENTIAL     Status: Abnormal   Collection Time    03/08/14  8:30 PM      Result Value Ref Range   WBC 11.6 (*) 4.0 - 10.5 K/uL   RBC 4.16  3.87 - 5.11 MIL/uL   Hemoglobin 13.4  12.0 - 15.0 g/dL   HCT 39.8  36.0 - 46.0 %   MCV 95.7  78.0 - 100.0 fL   MCH 32.2  26.0 - 34.0 pg   MCHC 33.7  30.0 - 36.0 g/dL   RDW 12.9  11.5 - 15.5 %   Platelets 253  150 - 400 K/uL   Neutrophils Relative % 48  43 - 77 %   Neutro Abs 5.6  1.7 - 7.7 K/uL   Lymphocytes Relative 43  12 - 46 %   Lymphs Abs 5.0 (*) 0.7 - 4.0 K/uL   Monocytes Relative 6  3 - 12 %   Monocytes Absolute 0.7  0.1 - 1.0 K/uL   Eosinophils Relative 2  0 - 5 %   Eosinophils Absolute 0.2  0.0 - 0.7 K/uL   Basophils Relative 0  0 - 1 %   Basophils Absolute 0.0  0.0 - 0.1 K/uL  COMPREHENSIVE METABOLIC PANEL     Status: Abnormal   Collection Time    03/08/14  8:30 PM      Result Value Ref Range   Sodium 138  137 - 147 mEq/L   Potassium 4.2  3.7 - 5.3 mEq/L   Chloride 99  96 - 112 mEq/L   CO2 27  19 - 32 mEq/L   Glucose, Bld 107 (*) 70 - 99  mg/dL   BUN 14  6 - 23 mg/dL   Creatinine, Ser 1.00  0.50 - 1.10 mg/dL   Calcium 10.0  8.4 - 10.5 mg/dL   Total Protein 7.5  6.0 - 8.3 g/dL   Albumin 4.1  3.5 - 5.2  g/dL   AST 38 (*) 0 - 37 U/L   ALT 50 (*) 0 - 35 U/L   Alkaline Phosphatase 63  39 - 117 U/L   Total Bilirubin <0.2 (*) 0.3 - 1.2 mg/dL   GFR calc non Af Amer 63 (*) >90 mL/min   GFR calc Af Amer 73 (*) >90 mL/min   Comment: (NOTE)     The eGFR has been calculated using the CKD EPI equation.     This calculation has not been validated in all clinical situations.     eGFR's persistently <90 mL/min signify possible Chronic Kidney     Disease.  LIPASE, BLOOD     Status: None   Collection Time    03/08/14  8:30 PM      Result Value Ref Range   Lipase 29  11 - 59 U/L    Imaging / Studies: US Abdomen Complete  03/08/2014   CLINICAL DATA:  Nausea, vomiting and abdominal bloating.  EXAM: ULTRASOUND ABDOMEN COMPLETE  COMPARISON:  CT of the abdomen and pelvis performed 10/30/2009, and abdominal ultrasound performed 10/26/2009  FINDINGS: Gallbladder:  Several stones of varying size are noted dependently within the gallbladder, measuring up to 1.8 cm in size; these appear to extend to the gallbladder neck. No gallbladder wall thickening or pericholecystic fluid is seen. No ultrasonographic Murphy's sign is elicited, though this is nonspecific as the patient is on pain medication.  Common bile duct:  Diameter: 0.7 cm, borderline prominent.  Liver:  No focal lesion identified. Diffusely increased parenchymal echogenicity and coarsened echotexture, compatible with fatty infiltration.  IVC:  No abnormality visualized. Incompletely characterized due to overlying bowel gas.  Pancreas:  Visualized portion unremarkable.  Spleen:  Size and appearance within normal limits.  Right Kidney:  Length: 12.6 cm. Echogenicity within normal limits. No mass or hydronephrosis visualized.  Left Kidney:  Length: 11.9 cm. Echogenicity within normal limits. No mass or hydronephrosis visualized.  Abdominal aorta:  No aneurysm visualized. Incompletely characterized due to overlying bowel gas.  Other findings:  None.  IMPRESSION: 1.  Stones of varying sizes dependently within the gallbladder, measuring up to 1.8 cm in size. These appear to extend to the gallbladder neck. No evidence for cholecystitis. No ultrasonographic Murphy's sign elicited, though this is nonspecific as the patient is on pain medication. 2. Borderline prominence of the common hepatic duct, measuring 0.7 cm. If the patient's symptoms persist, choledocholithiasis and mild intermittent obstruction is a possibility. 3. Diffuse fatty infiltration within the liver.   Electronically Signed   By: Garald Balding M.D.   On: 03/08/2014 22:21    Medications / Allergies: per chart  Antibiotics: Anti-infectives   None      Assessment  [name]  54 y.o. female       Problem List:  Principal Problem:   Nausea and vomiting Active Problems:   NASH (nonalcoholic steatohepatitis)   Low back pain   Personal history of colonic polyps   Gallstones   Abdominal distension - diffuse   Obesity (BMI 30-39.9)   Tobacco abuse   Intermittent episodes of nausea no vomiting bloating and distention.  More suspicious for obstruction.  Gallstones.  No evidence of cholecystitis.  Not  classic for biliary colic.  Plan:  I spent over an hour reviewing studies his films and performing history and physical.  Something is not right, but I am not convinced the gallbladder is the cause of all of this.  I spent a long time discussing differential diagnosis with classic history and findings.  I think she feels like a lot of this is been dismissed by doctors.  I tried to empathized further frustration explaining the reasoning for not rushing to surgery  I recommend CT scan of the abdomen and pelvis to rule out obstruction or other problems.  It has been 4 years since she had one.  Her symptoms seem to be more diffuse.  You think she would give some indication of irritation on ultrasound, exam, or history.  She does not.  Postprandial nature this does make me wonder though.  If  consistent with bowel obstruction, consider nasogastric tube placement admission and possible need for surgery if not better.  I cannot get a hold of the prior ED MD nor the current one.  However, I found a Librarian, academic.  Orders placed.  History of chronic constipation based on prior studies and history.  Will benefit from bowel regimen at some point.  Hold off on doing anything besides an enema at the most.  If CT scan completely negative, consider HIDA scan to rule out biliary obstruction.  Probably perform a gallbladder ejection fraction to see if that reproduces symptoms.  If that is positive, perform laparoscopic cholecystectomy.  He also could consider gastroenterology evaluation if she stays.  Otherwise followup with her current gastroenterologist.  Also could consider gastric emptying study to rule out gastroparesis.  You think that she would have more concerning her for reflux.  However, it is hard to tease a history out of her, so I would have for more objective her first period  Steatohepatitis/fatty change of the liver most likely stable.  LFTs are only mildly elevated at most.  AST ALT points to hepatic not biliary etiology.  It comes to surgery, could consider liver biopsy.  STOP SMOKING!   I updated the patient's status to the patient, her husband, and ED MD Monique Venora Maples.  Recommendations were made.  Questions were answered.  They expressed understanding & appreciation.    Adin Hector, M.D., F.A.C.S. Gastrointestinal and Minimally Invasive Surgery [facility] [address] [place], [place] [address] [phone] Main / Paging   03/09/2014  Note: This dictation was prepared with Dragon/digital dictation along with [facility] technology. Any transcriptional errors that result from this process are unintentional.

## 2014-03-09 NOTE — ED Provider Notes (Signed)
7:10 AM Patient continues to feel comfortable at this time.  CT scan without acute pathology.  Given the patient's intermittent abdominal discomfort over the past 5 years now worsening over the past week and a HIDA scan will be performed.  If this is abnormal we will need to contact general surgery for possible cholecystectomy.  If this is normal the patient can be followed up by her gastroenterologist [name]  Care to oncoming physician, Dr Jenne Campus, MD 03/09/14 0730

## 2014-03-09 NOTE — H&P (Signed)
General Surgery [facility]  See admit note from earlier today by Dr. Neysa Bonito.  CT scan shows cholelithiasis, fatty infiltration of liver.  No acute process.  HIDA shows no obstruction, low EF 10% consistent with biliary dyskinesia, patient was symptomatic with CCK administration.  Will admit to general surgery service for cholecystectomy.  Discussed with patient and husband at bedside.  Explained approximately 60-70% chance of symptomatic improvement with cholecystectomy.  However, may have persistent symptoms and need further management by gastroenterology post-operatively.  They understand and wish to proceed with admission and cholecystectomy.  The risks and benefits of the procedure have been discussed at length with the patient.  The patient understands the proposed procedure, potential alternative treatments, and the course of recovery to be expected.  All of the patient's questions have been answered at this time.  The patient wishes to proceed with surgery.  [name], MD, [facility] Office: [phone]

## 2014-03-09 NOTE — ED Notes (Signed)
Bed: ID39 Expected date:  Expected time:  Means of arrival:  Comments: Pt from [place]

## 2014-03-09 NOTE — ED Notes (Signed)
Returned from CT.

## 2014-03-10 ENCOUNTER — Encounter (HOSPITAL_COMMUNITY): Payer: 59 | Admitting: Registered Nurse

## 2014-03-10 ENCOUNTER — Observation Stay (HOSPITAL_COMMUNITY): Payer: 59

## 2014-03-10 ENCOUNTER — Encounter (HOSPITAL_COMMUNITY)
Admission: EM | Disposition: A | Discharge status: Home / Self Care | Payer: Self-pay | Source: Home / Self Care | Attending: Emergency Medicine

## 2014-03-10 ENCOUNTER — Observation Stay (HOSPITAL_COMMUNITY): Payer: 59 | Admitting: Registered Nurse

## 2014-03-10 ENCOUNTER — Encounter (HOSPITAL_COMMUNITY): Payer: Self-pay | Admitting: Registered Nurse

## 2014-03-10 DIAGNOSIS — K8010 Calculus of gallbladder with chronic cholecystitis without obstruction: Secondary | ICD-10-CM | POA: Diagnosis present

## 2014-03-10 HISTORY — PX: CHOLECYSTECTOMY: SHX55

## 2014-03-10 SURGERY — LAPAROSCOPIC CHOLECYSTECTOMY WITH INTRAOPERATIVE CHOLANGIOGRAM
Anesthesia: General

## 2014-03-10 MED ORDER — PROPOFOL 10 MG/ML IV BOLUS
INTRAVENOUS | Status: DC | PRN
  Administered 2014-03-10: 150 mg via INTRAVENOUS
  Administered 2014-03-10: 50 mg via INTRAVENOUS

## 2014-03-10 MED ORDER — ONDANSETRON HCL 4 MG/2ML IJ SOLN: 4.0000 mg | Freq: Four times a day (QID) | INTRAMUSCULAR | Status: DC | PRN

## 2014-03-10 MED ORDER — LACTATED RINGERS IR SOLN
Status: DC | PRN
  Administered 2014-03-10: 1000 mL

## 2014-03-10 MED ORDER — METOCLOPRAMIDE HCL 5 MG/ML IJ SOLN
INTRAMUSCULAR | Status: DC | PRN
  Administered 2014-03-10: 10 mg via INTRAVENOUS

## 2014-03-10 MED ORDER — SUCCINYLCHOLINE CHLORIDE 20 MG/ML IJ SOLN
INTRAMUSCULAR | Status: DC | PRN
Start: 2014-03-10 — End: 2014-03-10
  Administered 2014-03-10: 100 mg via INTRAVENOUS

## 2014-03-10 MED ORDER — GLYCOPYRROLATE 0.2 MG/ML IJ SOLN
INTRAMUSCULAR | Status: AC
  Filled 2014-03-10: qty 3

## 2014-03-10 MED ORDER — KETOROLAC TROMETHAMINE 30 MG/ML IJ SOLN
15.0000 mg | Freq: Once | INTRAMUSCULAR | Status: AC | PRN
  Administered 2014-03-10: 30 mg via INTRAVENOUS
  Filled 2014-03-10: qty 1

## 2014-03-10 MED ORDER — LIDOCAINE HCL (CARDIAC) 20 MG/ML IV SOLN
INTRAVENOUS | Status: AC
  Filled 2014-03-10: qty 5

## 2014-03-10 MED ORDER — BUPIVACAINE-EPINEPHRINE 0.25% -1:200000 IJ SOLN
INTRAMUSCULAR | Status: DC | PRN
  Administered 2014-03-10: 10 mL

## 2014-03-10 MED ORDER — IOHEXOL 300 MG/ML  SOLN
INTRAMUSCULAR | Status: DC | PRN
  Administered 2014-03-10: 4 mL

## 2014-03-10 MED ORDER — FENTANYL CITRATE 0.05 MG/ML IJ SOLN
INTRAMUSCULAR | Status: AC
  Filled 2014-03-10: qty 5

## 2014-03-10 MED ORDER — PROPOFOL 10 MG/ML IV BOLUS
INTRAVENOUS | Status: AC
Start: 2014-03-10 — End: 2014-03-10
  Filled 2014-03-10: qty 20

## 2014-03-10 MED ORDER — KETOROLAC TROMETHAMINE 30 MG/ML IJ SOLN
INTRAMUSCULAR | Status: AC
  Filled 2014-03-10: qty 1

## 2014-03-10 MED ORDER — CIPROFLOXACIN IN D5W 400 MG/200ML IV SOLN
400.0000 mg | Freq: Two times a day (BID) | INTRAVENOUS | Status: DC
  Administered 2014-03-10: 400 mg via INTRAVENOUS
  Filled 2014-03-10 (×2): qty 200

## 2014-03-10 MED ORDER — ONDANSETRON HCL 4 MG PO TABS
4.0000 mg | ORAL_TABLET | Freq: Four times a day (QID) | ORAL | Status: DC | PRN
  Administered 2014-03-10: 4 mg via ORAL
  Filled 2014-03-10: qty 1

## 2014-03-10 MED ORDER — ONDANSETRON HCL 4 MG/2ML IJ SOLN
INTRAMUSCULAR | Status: DC | PRN
  Administered 2014-03-10: 4 mg via INTRAVENOUS

## 2014-03-10 MED ORDER — ONDANSETRON HCL 4 MG/2ML IJ SOLN
INTRAMUSCULAR | Status: AC
  Filled 2014-03-10: qty 2

## 2014-03-10 MED ORDER — KCL IN DEXTROSE-NACL 20-5-0.45 MEQ/L-%-% IV SOLN
INTRAVENOUS | Status: AC
  Filled 2014-03-10: qty 1000

## 2014-03-10 MED ORDER — PROMETHAZINE HCL 25 MG/ML IJ SOLN: 6.2500 mg | INTRAMUSCULAR | Status: DC | PRN

## 2014-03-10 MED ORDER — PROPOFOL 10 MG/ML IV BOLUS
INTRAVENOUS | Status: AC
  Filled 2014-03-10: qty 20

## 2014-03-10 MED ORDER — FENTANYL CITRATE 0.05 MG/ML IJ SOLN
INTRAMUSCULAR | Status: DC | PRN
  Administered 2014-03-10 (×5): 50 ug via INTRAVENOUS

## 2014-03-10 MED ORDER — DEXAMETHASONE SODIUM PHOSPHATE 10 MG/ML IJ SOLN
INTRAMUSCULAR | Status: DC | PRN
  Administered 2014-03-10: 10 mg via INTRAVENOUS

## 2014-03-10 MED ORDER — HYDROMORPHONE HCL PF 1 MG/ML IJ SOLN
0.2500 mg | INTRAMUSCULAR | Status: DC | PRN
  Administered 2014-03-10: 0.5 mg via INTRAVENOUS
  Administered 2014-03-10 (×2): 0.25 mg via INTRAVENOUS

## 2014-03-10 MED ORDER — MIDAZOLAM HCL 5 MG/5ML IJ SOLN
INTRAMUSCULAR | Status: DC | PRN
  Administered 2014-03-10: 2 mg via INTRAVENOUS

## 2014-03-10 MED ORDER — GLYCOPYRROLATE 0.2 MG/ML IJ SOLN
INTRAMUSCULAR | Status: DC | PRN
  Administered 2014-03-10: 0.2 mg via INTRAVENOUS
  Administered 2014-03-10: .6 mg via INTRAVENOUS

## 2014-03-10 MED ORDER — HYDROCODONE-ACETAMINOPHEN 5-325 MG PO TABS: 1.0000 | ORAL_TABLET | ORAL | Status: DC | PRN

## 2014-03-10 MED ORDER — LIDOCAINE HCL (CARDIAC) 20 MG/ML IV SOLN
INTRAVENOUS | Status: DC | PRN
  Administered 2014-03-10: 100 mg via INTRAVENOUS

## 2014-03-10 MED ORDER — KCL IN DEXTROSE-NACL 20-5-0.45 MEQ/L-%-% IV SOLN
INTRAVENOUS | Status: DC
  Administered 2014-03-10: 13:00:00 via INTRAVENOUS
  Filled 2014-03-10 (×2): qty 1000

## 2014-03-10 MED ORDER — BUPIVACAINE-EPINEPHRINE (PF) 0.25% -1:200000 IJ SOLN
INTRAMUSCULAR | Status: AC
Start: 2014-03-10 — End: 2014-03-10
  Filled 2014-03-10: qty 30

## 2014-03-10 MED ORDER — NEOSTIGMINE METHYLSULFATE 10 MG/10ML IV SOLN
INTRAVENOUS | Status: DC | PRN
  Administered 2014-03-10: 4 mg via INTRAVENOUS

## 2014-03-10 MED ORDER — ALBUTEROL SULFATE HFA 108 (90 BASE) MCG/ACT IN AERS
INHALATION_SPRAY | RESPIRATORY_TRACT | Status: DC | PRN
  Administered 2014-03-10: 3 via RESPIRATORY_TRACT

## 2014-03-10 MED ORDER — CISATRACURIUM BESYLATE 20 MG/10ML IV SOLN
INTRAVENOUS | Status: AC
  Filled 2014-03-10: qty 10

## 2014-03-10 MED ORDER — 0.9 % SODIUM CHLORIDE (POUR BTL) OPTIME
TOPICAL | Status: DC | PRN
  Administered 2014-03-10: 1000 mL

## 2014-03-10 MED ORDER — CIPROFLOXACIN IN D5W 400 MG/200ML IV SOLN
INTRAVENOUS | Status: AC
  Filled 2014-03-10: qty 200

## 2014-03-10 MED ORDER — HYDROMORPHONE HCL PF 1 MG/ML IJ SOLN: 1.0000 mg | INTRAMUSCULAR | Status: DC | PRN

## 2014-03-10 MED ORDER — ALBUTEROL SULFATE HFA 108 (90 BASE) MCG/ACT IN AERS
INHALATION_SPRAY | RESPIRATORY_TRACT | Status: AC
  Filled 2014-03-10: qty 6.7

## 2014-03-10 MED ORDER — CISATRACURIUM BESYLATE (PF) 10 MG/5ML IV SOLN
INTRAVENOUS | Status: DC | PRN
  Administered 2014-03-10: 4 mg via INTRAVENOUS
  Administered 2014-03-10: 2 mg via INTRAVENOUS

## 2014-03-10 MED ORDER — MIDAZOLAM HCL 2 MG/2ML IJ SOLN
INTRAMUSCULAR | Status: AC
  Filled 2014-03-10: qty 2

## 2014-03-10 MED ORDER — LACTATED RINGERS IV SOLN
INTRAVENOUS | Status: DC | PRN
  Administered 2014-03-10: 10:00:00 via INTRAVENOUS

## 2014-03-10 MED ORDER — HYDROMORPHONE HCL PF 1 MG/ML IJ SOLN
INTRAMUSCULAR | Status: AC
  Filled 2014-03-10: qty 1

## 2014-03-10 MED ORDER — ACETAMINOPHEN 325 MG PO TABS: 650.0000 mg | ORAL_TABLET | ORAL | Status: DC | PRN

## 2014-03-10 SURGICAL SUPPLY — 39 items
APL SKNCLS STERI-STRIP NONHPOA (GAUZE/BANDAGES/DRESSINGS) ×1
APPLIER CLIP ROT 10 11.4 M/L (STAPLE) ×2
APR CLP MED LRG 11.4X10 (STAPLE) ×1
BAG SPEC RTRVL LRG 6X4 10 (ENDOMECHANICALS) ×1
BENZOIN TINCTURE PRP APPL 2/3 (GAUZE/BANDAGES/DRESSINGS) ×2 IMPLANT
CABLE HIGH FREQUENCY MONO STRZ (ELECTRODE) ×2 IMPLANT
CANISTER SUCTION 2500CC (MISCELLANEOUS) ×2 IMPLANT
CHLORAPREP W/TINT 26ML (MISCELLANEOUS) ×2 IMPLANT
CLIP APPLIE ROT 10 11.4 M/L (STAPLE) ×1 IMPLANT
COVER MAYO STAND STRL (DRAPES) ×2 IMPLANT
DECANTER SPIKE VIAL GLASS SM (MISCELLANEOUS) ×2 IMPLANT
DRAPE C-ARM 42X120 X-RAY (DRAPES) ×2 IMPLANT
DRAPE LAPAROSCOPIC ABDOMINAL (DRAPES) ×2 IMPLANT
DRAPE UTILITY XL STRL (DRAPES) ×2 IMPLANT
ELECT REM PT RETURN 9FT ADLT (ELECTROSURGICAL) ×2
ELECTRODE REM PT RTRN 9FT ADLT (ELECTROSURGICAL) ×1 IMPLANT
GLOVE SURG ORTHO 8.0 STRL STRW (GLOVE) ×2 IMPLANT
GOWN SRG XL XLNG 56XLVL 4 (GOWN DISPOSABLE) IMPLANT
GOWN STRL NON-REIN XL XLG LVL4 (GOWN DISPOSABLE) ×6
GOWN STRL REUS W/TWL 2XL LVL3 (GOWN DISPOSABLE) ×1 IMPLANT
GOWN STRL REUS W/TWL XL LVL3 (GOWN DISPOSABLE) ×4 IMPLANT
HEMOSTAT SURGICEL 4X8 (HEMOSTASIS) IMPLANT
KIT BASIN OR (CUSTOM PROCEDURE TRAY) ×2 IMPLANT
NS IRRIG 1000ML POUR BTL (IV SOLUTION) ×2 IMPLANT
POUCH SPECIMEN RETRIEVAL 10MM (ENDOMECHANICALS) ×2 IMPLANT
SCISSORS LAP 5X35 DISP (ENDOMECHANICALS) ×2 IMPLANT
SET CHOLANGIOGRAPH MIX (MISCELLANEOUS) ×2 IMPLANT
SET IRRIG TUBING LAPAROSCOPIC (IRRIGATION / IRRIGATOR) ×2 IMPLANT
SLEEVE XCEL OPT CAN 5 100 (ENDOMECHANICALS) ×2 IMPLANT
SOLUTION ANTI FOG 6CC (MISCELLANEOUS) ×2 IMPLANT
STRIP CLOSURE SKIN 1/2X4 (GAUZE/BANDAGES/DRESSINGS) ×2 IMPLANT
SUT MNCRL AB 4-0 PS2 18 (SUTURE) ×2 IMPLANT
TOWEL OR 17X26 10 PK STRL BLUE (TOWEL DISPOSABLE) ×2 IMPLANT
TOWEL OR NON WOVEN STRL DISP B (DISPOSABLE) ×2 IMPLANT
TRAY LAP CHOLE (CUSTOM PROCEDURE TRAY) ×2 IMPLANT
TROCAR BLADELESS OPT 5 100 (ENDOMECHANICALS) ×2 IMPLANT
TROCAR XCEL BLUNT TIP 100MML (ENDOMECHANICALS) ×2 IMPLANT
TROCAR XCEL NON-BLD 11X100MML (ENDOMECHANICALS) ×2 IMPLANT
TUBING INSUFFLATION 10FT LAP (TUBING) ×2 IMPLANT

## 2014-03-10 NOTE — Op Note (Signed)
Procedure Note  Pre-operative Diagnosis:  Chronic cholecystitis, cholelithiasis, biliary dyskinesia  Post-operative Diagnosis:  same  Surgeon:  [name], MD, FACS  Assistant:  Leighton Ruff, MD   Procedure:  Laparoscopic cholecystectomy with intra-operative cholangiography  Anesthesia:  General  Estimated Blood Loss:  minimal  Drains: none         Specimen: Gallbladder to pathology  Indications:  Patient is a 53 yo WF admitted from ER with abdominal pain, gallstones, and abnormal HIDA scan.  Now for cholecystectomy.  Procedure Details:  The patient was seen in the pre-op holding area. The risks, benefits, complications, treatment options, and expected outcomes were previously discussed with the patient. The patient agreed with the proposed plan and has signed the informed consent form.  The patient was brought to the Operating Room, identified as [name] and the procedure verified as laparoscopic cholecystectomy with intraoperative cholangiography. A "time out" was completed and the above information confirmed.  The patient was placed in the supine position. Following induction of general anesthesia, the abdomen was prepped and draped in the usual aseptic fashion.  An incision was made in the skin near the umbilicus. The midline fascia was incised and the peritoneal cavity was entered and a Hasson canula was introduced under direct vision.  The Hasson canula was secured with a 0-Vicryl pursestring suture. Pneumoperitoneum was established with carbon dioxide. Additional trocars were introduced under direct vision along the right costal margin in the midline, mid-clavicular line, and anterior axillary line.   The gallbladder was identified and the fundus grasped and retracted cephalad. Adhesions were taken down bluntly and the electrocautery was utilized as needed, taking care not to injure any adjacent structures. The infundibulum was grasped and retracted laterally,  exposing the peritoneum overlying the triangle of Calot. The peritoneum was incised and structures exposed with blunt dissection. The cystic duct was clearly identified, bluntly dissected circumferentially, and clipped at the neck of the gallbladder.  An incision was made in the cystic duct and the cholangiogram catheter introduced. The catheter was secured using an ligaclip.  Real-time cholangiography was performed using C-arm fluoroscopy.  There was rapid filling of a normal caliber common bile duct.  There was reflux of contrast into the left and right hepatic ductal systems.  There was free flow distally into the duodenum without filling defect or obstruction.  The catheter was removed from the peritoneal cavity.  The cystic duct was then ligated with surgical clips and divided. The cystic artery was identified, dissected circumferentially, ligated with ligaclips, and divided.  The gallbladder was dissected away from the liver bed using the electrocautery for hemostasis. The gallbladder was completely removed from the liver and placed into an endocatch bag. The right upper quadrant was irrigated and the gallbladder bed was inspected. Hemostasis was achieved with the electrocautery.  Pneumoperitoneum was released after viewing removal of the trocars with good hemostasis noted. The umbilical wound was irrigated and the fascia was then closed with the pursestring suture.  Local anesthetic was infiltrated at all port sites. The skin incisions were closed with 4-0 Monocril subcuticular sutures and steri-strips and dressings were applied.  Instrument, sponge, and needle counts were correct at the conclusion of the case.  The patient was awakened from anesthesia and brought to the recovery room in stable condition.  The patient tolerated the procedure well.   [name], MD, [facility] Office: [phone]

## 2014-03-10 NOTE — Transfer of Care (Signed)
Immediate Anesthesia Transfer of Care Note  Patient: [name]  Procedure(s) Performed: Procedure(s): LAPAROSCOPIC CHOLECYSTECTOMY WITH INTRAOPERATIVE CHOLANGIOGRAM (N/A)  Patient Location: PACU  Anesthesia Type:General  Level of Consciousness: awake, alert , oriented and patient cooperative  Airway & Oxygen Therapy: Patient Spontanous Breathing and Patient connected to face mask oxygen  Post-op Assessment: Report given to PACU RN, Post -op Vital signs reviewed and stable and Patient moving all extremities  Post vital signs: Reviewed and stable  Complications: No apparent anesthesia complications

## 2014-03-10 NOTE — Anesthesia Preprocedure Evaluation (Addendum)
Anesthesia Evaluation  Patient identified by MRN, date of birth, ID band Patient awake    Reviewed: Allergy & Precautions, H&P , NPO status , Patient's Chart, lab work & pertinent test results  Airway Mallampati: II TM Distance: >3 FB Neck ROM: Full    Dental no notable dental hx.    Pulmonary sleep apnea , Current Smoker,  breath sounds clear to auscultation  Pulmonary exam normal       Cardiovascular negative cardio ROS  Rhythm:Regular Rate:Normal     Neuro/Psych negative neurological ROS  negative psych ROS   GI/Hepatic negative GI ROS, Neg liver ROS,   Endo/Other  negative endocrine ROS  Renal/GU negative Renal ROS  negative genitourinary   Musculoskeletal negative musculoskeletal ROS (+)   Abdominal   Peds negative pediatric ROS (+)  Hematology negative hematology ROS (+)   Anesthesia Other Findings   Reproductive/Obstetrics negative OB ROS                         Anesthesia Physical Anesthesia Plan  ASA: III  Anesthesia Plan: General   Post-op Pain Management:    Induction: Intravenous  Airway Management Planned: Oral ETT  Additional Equipment:   Intra-op Plan:   Post-operative Plan: Extubation in OR  Informed Consent: I have reviewed the patients History and Physical, chart, labs and discussed the procedure including the risks, benefits and alternatives for the proposed anesthesia with the patient or authorized representative who has indicated his/her understanding and acceptance.   Dental advisory given  Plan Discussed with: CRNA and Surgeon  Anesthesia Plan Comments:         Anesthesia Quick Evaluation

## 2014-03-11 MED ORDER — HYDROCODONE-ACETAMINOPHEN 5-325 MG PO TABS: 1.0000 | ORAL_TABLET | ORAL | Status: DC | PRN

## 2014-03-11 NOTE — Discharge Instructions (Signed)
[facility] ______[facility] LAPAROSCOPIC SURGERY: POST OP INSTRUCTIONS Always review your discharge instruction sheet given to you by the facility where your surgery was performed. IF YOU HAVE DISABILITY OR FAMILY LEAVE FORMS, YOU MUST BRING THEM TO THE OFFICE FOR PROCESSING.   DO NOT GIVE THEM TO YOUR DOCTOR.  1. A prescription for pain medication may be given to you upon discharge.  Take your pain medication as prescribed, if needed.  If narcotic pain medicine is not needed, then you may take acetaminophen (Tylenol) or ibuprofen (Advil) as needed. 2. Take your usually prescribed medications unless otherwise directed. 3. If you need a refill on your pain medication, please contact your pharmacy.  They will contact our office to request authorization. Prescriptions will not be filled after 5pm or on week-ends. 4. You should follow a light diet the first few days after arrival home, such as soup and crackers, etc.  Be sure to include lots of fluids daily. 5. Most patients will experience some swelling and bruising in the area of the incisions.  Ice packs will help.  Swelling and bruising can take several days to resolve.  6. It is common to experience some constipation if taking pain medication after surgery.  Increasing fluid intake and taking a stool softener (such as Colace) will usually help or prevent this problem from occurring.  A mild laxative (Milk of Magnesia or Miralax) should be taken according to package instructions if there are no bowel movements after 48 hours. 7. Unless discharge instructions indicate otherwise, you may remove your bandages 24-48 hours after surgery, and you may shower at that time.  You may have steri-strips (small skin tapes) in place directly over the incision.  These strips should be left on the skin.  If your surgeon used skin glue on the incision, you may shower in 24 hours.  The glue will flake off over the next 2-3 weeks.  Any sutures or staples will  be removed at the office during your follow-up visit. 8. ACTIVITIES:  You may resume regular (light) daily activities beginning the next day--such as daily self-care, walking, climbing stairs--gradually increasing activities as tolerated.  You may have sexual intercourse when it is comfortable.  Refrain from any heavy lifting or straining-nothing over 10 pounds for 2 weeks.  a. You may drive when you are no longer taking prescription pain medication, you can comfortably wear a seatbelt, and you can safely maneuver your car and apply brakes. b. RETURN TO WORK:  _Desk work in 5-7 days, full duty in 2 weeks._________________________________________________________ 9. You should see your doctor in the office for a follow-up appointment approximately 2-3 weeks after your surgery.  Make sure that you call for this appointment within a day or two after you arrive home to insure a convenient appointment time. 10. OTHER INSTRUCTIONS: __________________________________________________________________________________________________________________________ __________________________________________________________________________________________________________________________ WHEN TO CALL YOUR DOCTOR: 1. Fever over 101.0 2. Inability to urinate 3. Continued bleeding from incision. 4. Increased pain, redness, or drainage from the incision. 5. Increasing abdominal pain  The clinic staff is available to answer your questions during regular business hours.  Please dont hesitate to call and ask to speak to one of the nurses for clinical concerns.  If you have a medical emergency, go to the nearest emergency room or call 911.  A surgeon from [facility] is always on call at the hospital. [address], [place], [place], [place]  [address] ? [address] [place], [place], [place]   [phone] ?  [phone] ? FAX [phone] Web site: [email or web address]

## 2014-03-11 NOTE — Discharge Summary (Signed)
Physician Discharge Summary  Patient ID: [name] MRN: [id number] DOB/AGE: 1961-03-26 53 y.o.  Admit date: 03/08/2014 Discharge date: 03/11/2014  Admission Diagnoses:  Symptomatic cholelithiasis  Discharge Diagnoses:  Principal Problem:   Biliary dyskinesia Active Problems:   NASH (nonalcoholic steatohepatitis)   Low back pain   Personal history of colonic polyps   Nausea and vomiting   Abdominal distension - diffuse   Obesity (BMI 30-39.9)   Tobacco abuse   Cholelithiasis with cholecystitis   Discharged Condition: good  Hospital Course: She was admitted and underwent a laparoscopic cholecystectomy with cholangiogram 03/10/2014. She did well with this. By her first postoperative day she was tolerating a diet, ambulating, and was ready for discharge. Discharge instructions were given to her.  Consults: None  Treatments: surgery: Laparoscopic cholecystectomy with cholangiogram  Discharge Exam: Blood pressure 98/62, pulse 53, temperature 98.4 F (36.9 C), temperature source Oral, resp. rate 20, height 5' 4"  (1.626 m), weight 180 lb (81.647 kg), SpO2 92.00%.   Disposition: 01-Home or Self Care   Future Appointments Provider Department Dept Phone   03/13/2014 7:30 AM [facility] [phone]   NPO 6 hrs prior to scheduled exam No Opiate-based MEDS 6 hrs prior Peg Tube solutions: D/C for 6 hrs NPO after midnight       Medication List         HYDROcodone-acetaminophen 5-325 MG per tablet  Commonly known as:  NORCO/VICODIN  Take 1-2 tablets by mouth every 4 (four) hours as needed for moderate pain.     ondansetron 4 MG disintegrating tablet  Commonly known as:  ZOFRAN-ODT  Take 4 mg by mouth every 8 (eight) hours as needed for nausea or vomiting.     ondansetron 4 MG disintegrating tablet  Commonly known as:  ZOFRAN ODT  Take 1 tablet (4 mg total) by mouth every 8 (eight) hours as needed.     oxyCODONE-acetaminophen  5-325 MG per tablet  Commonly known as:  PERCOCET/ROXICET  Take 2 tablets by mouth every 4 (four) hours as needed for severe pain.           Follow-up Information   Follow up with Beryle Beams, MD. (See your Gi doctor as scheduled for further evaluation)    Specialty:  Gastroenterology   Contact information:   [address] [place] [address] [phone]       Signed: Rhunette Croft Aerion Bagdasarian 03/11/2014, 10:35 AM

## 2014-03-11 NOTE — Progress Notes (Signed)
1 Day Post-Op  Subjective: Feels good.  Minimal pain.  Tolerating liquids.  Objective: Vital signs in last 24 hours: Temp:  [96.8 F (36 C)-98.7 F (37.1 C)] 98.4 F (36.9 C) (05/03 0627) Pulse Rate:  [53-101] 53 (05/03 0627) Resp:  [12-20] 20 (05/03 0627) BP: (98-134)/(59-80) 98/62 mmHg (05/03 0627) SpO2:  [90 %-100 %] 92 % (05/03 0627) Last BM Date: 03/08/14  Intake/Output from previous day: 05/02 0701 - 05/03 0700 In: 1686.7 [P.O.:360; I.V.:1326.7] Out: 5320 [Urine:5300; Blood:20] Intake/Output this shift: Total I/O In: 360 [P.O.:360] Out: 200 [Urine:200]  PE: General- In NAD Abdomen-soft, dressings dry  Lab Results:   Recent Labs  03/08/14 2030  WBC 11.6*  HGB 13.4  HCT 39.8  PLT 253   BMET  Recent Labs  03/08/14 2030  NA 138  K 4.2  CL 99  CO2 27  GLUCOSE 107*  BUN 14  CREATININE 1.00  CALCIUM 10.0   PT/INR No results found for this basename: LABPROT, INR,  in the last 72 hours Comprehensive Metabolic Panel:    Component Value Date/Time   NA 138 03/08/2014 2030   NA 138 08/31/2013 1100   K 4.2 03/08/2014 2030   K 4.8 08/31/2013 1100   CL 99 03/08/2014 2030   CL 103 08/31/2013 1100   CO2 27 03/08/2014 2030   CO2 27 08/31/2013 1100   BUN 14 03/08/2014 2030   BUN 18 08/31/2013 1100   CREATININE 1.00 03/08/2014 2030   CREATININE 0.9 08/31/2013 1100   GLUCOSE 107* 03/08/2014 2030   GLUCOSE 104* 08/31/2013 1100   CALCIUM 10.0 03/08/2014 2030   CALCIUM 9.5 08/31/2013 1100   AST 38* 03/08/2014 2030   AST 41* 08/31/2013 1100   ALT 50* 03/08/2014 2030   ALT 60* 08/31/2013 1100   ALKPHOS 63 03/08/2014 2030   ALKPHOS 56 08/31/2013 1100   BILITOT <0.2* 03/08/2014 2030   BILITOT 0.4 08/31/2013 1100   PROT 7.5 03/08/2014 2030   PROT 7.8 08/31/2013 1100   ALBUMIN 4.1 03/08/2014 2030   ALBUMIN 4.2 08/31/2013 1100     Studies/Results: Dg Cholangiogram Operative  03/10/2014   CLINICAL DATA:  Intraoperative cholangiogram  EXAM: INTRAOPERATIVE CHOLANGIOGRAM   FLUOROSCOPY TIME:  21 seconds  COMPARISON:  NM HEPATO W/GB/PHARM/QUAN MEASUR dated 03/09/2014; CT ABD/PELVIS W CM dated 03/09/2014; US ABDOMEN COMPLETE dated 03/08/2014  FINDINGS: Intraoperative angiographic images of the right upper abdominal quadrant during laparoscopic cholecystectomy are provided for review.  Surgical clips overlie the expected location of the gallbladder fossa.  Contrast injection demonstrates selective cannulation of the central aspect of the cystic duct.  There is passage of contrast through the central aspect of the cystic duct with filling of a non dilated common bile duct. There is passage of contrast though the CBD and into the descending portion of the duodenum.  There is minimal reflux of injected contrast into the common hepatic duct and central aspect of the non dilated intrahepatic biliary system.  There are no discrete filling defects within the opacified portions of the biliary system to suggest the presence of choledocholithiasis.  IMPRESSION: No evidence of choledocholithiasis.   Electronically Signed   By: Sandi Mariscal M.D.   On: 03/10/2014 14:58   Nm Hepato W/eject Fract  03/09/2014   CLINICAL DATA:  Check GB ejection fraction  EXAM: NUCLEAR MEDICINE HEPATOBILIARY IMAGING WITH GALLBLADDER EF  TECHNIQUE: Sequential images of the abdomen were obtained out to 60 minutes following intravenous administration of radiopharmaceutical. After slow intravenous infusion of 1.6  micrograms Cholecystokinin, gallbladder ejection fraction was determined.  RADIOPHARMACEUTICALS:  5.5 mCiTc-80mCholetec  COMPARISON:  CT ABD/PELVIS W CM dated 03/09/2014; UK[place]ABDOMEN COMPLETE dated 03/08/2014  FINDINGS: Diffuse homogeneous radiotracer activity throughout the liver parenchyma. Intra and extra hepatic biliary excretion is appreciated. There is drainage into the small bowel peristalsis. Radiotracer excretion into the gallbladder is appreciated.  The gallbladder ejection fraction is 10.5%. At 30 min, normal  ejection fraction is greater than 30%.  The patient did experience symptoms during CCK infusion.  IMPRESSION: Abnormal gallbladder ejection fraction raise concern of hypokinetic biliary dyskinesia. Differential consideration chronic cholecystitis.   Electronically Signed   By: HMargaree MackintoshM.D.   On: 03/09/2014 12:04    Anti-infectives: Anti-infectives   Start     Dose/Rate Route Frequency Ordered Stop   03/10/14 1115  ciprofloxacin (CIPRO) IVPB 400 mg  Status:  Discontinued     400 mg 200 mL/hr over 60 Minutes Intravenous Every 12 hours 03/10/14 1104 03/10/14 1125      Assessment Principal Problem:   Biliary dyskinesia and Cholelithiasis with cholecystitis s/p lap chole with IOC 03/10/14-doing well.    LOS: 3 days   Plan: Discharge.  Instructions given.   TRhunette CroftRosenbower 03/11/2014

## 2014-03-12 NOTE — ED Provider Notes (Signed)
Medical screening examination/treatment/procedure(s) were conducted as a shared visit with non-physician practitioner(s) and myself.  I personally evaluated the patient during the encounter. Patient is a 53 year old female with a five-year history of abdominal issues. She was told at one point that she had gall bladder problems, however saw a surgeon and did not feel as though this required removal. She's been having problems since that time. She started several days ago with worsening pain and vomiting and presents for evaluation of this.  On exam, vitals are stable and the patient is afebrile. Head is atraumatic, normocephalic. Neck is supple. Heart regular rate and rhythm and lungs are clear. Abdomen is tender to palpation in the epigastrium and right upper quadrant with no rebound or guarding. Externally is without edema.  Workup reveals a mild elevation of white count and LFTs. The ultrasound shows a mildly dilated common bile duct and multiple stones within the gallbladder. There is no evidence for thickening of the gallbladder or fluid around it, however due to the degree of discomfort surgical consultation was obtained. I spoke with Dr. Johney Maine at [facility] who would like to have the patient transferred to the emergency department where he can evaluate her. I spoke with Dr. Zenia Resides in the emergency department who accepts the patient in transfer.     Veryl Speak, MD 03/12/14 [phone]

## 2014-03-13 ENCOUNTER — Ambulatory Visit (HOSPITAL_COMMUNITY): Payer: 59

## 2014-03-13 ENCOUNTER — Encounter (HOSPITAL_COMMUNITY): Payer: Self-pay | Admitting: Surgery

## 2014-03-13 NOTE — Anesthesia Postprocedure Evaluation (Signed)
  Anesthesia Post-op Note  Patient: [name]  Procedure(s) Performed: Procedure(s) (LRB): LAPAROSCOPIC CHOLECYSTECTOMY WITH INTRAOPERATIVE CHOLANGIOGRAM (N/A)  Patient Location: PACU  Anesthesia Type: General  Level of Consciousness: awake and alert   Airway and Oxygen Therapy: Patient Spontanous Breathing  Post-op Pain: mild  Post-op Assessment: Post-op Vital signs reviewed, Patient's Cardiovascular Status Stable, Respiratory Function Stable, Patent Airway and No signs of Nausea or vomiting  Last Vitals:  Filed Vitals:   03/11/14 0627  BP: 98/62  Pulse: 53  Temp: 36.9 C  Resp: 20    Post-op Vital Signs: stable   Complications: No apparent anesthesia complications

## 2014-03-14 NOTE — Progress Notes (Signed)
Quick Note:  Expected path results from gallbladder surgery.  Monique Regal, MD, [facility] Office: [phone]    ______

## 2014-05-24 ENCOUNTER — Ambulatory Visit (INDEPENDENT_AMBULATORY_CARE_PROVIDER_SITE_OTHER): Payer: 59 | Admitting: Surgery

## 2014-05-24 ENCOUNTER — Encounter (INDEPENDENT_AMBULATORY_CARE_PROVIDER_SITE_OTHER): Payer: Self-pay | Admitting: Surgery

## 2014-05-24 VITALS — BP 102/74 | HR 80 | Temp 98.2°F | Resp 16 | Ht 65.0 in | Wt 180.8 lb

## 2014-05-24 DIAGNOSIS — R109 Unspecified abdominal pain: Secondary | ICD-10-CM

## 2014-05-24 NOTE — Progress Notes (Signed)
General Surgery [facility]  Chief Complaint  Patient presents with  . New Evaluation    abdominal pain, "tortuous sigmoid colon" - referral from Dr. Carol Ada    HISTORY: Patient is a 53 year old female known to my surgical practice from urgent cholecystectomy in May 2015. Patient presents today for evaluation of chronic abdominal pain and bloating. Patient has had symptoms for approximately 5 years.  Patient notes development of intermittent abdominal pain, abdominal distention, and nausea and vomiting approximately 5 years ago when she underwent laparoscopic appendectomy for perforated appendicitis. Subsequent to that surgery, the patient underwent a total abdominal hysterectomy which was converted from laparoscopic to open due to the presence of extensive adhesions. Over the past 3 years the patient has had intermittent episodes of abdominal distention, abdominal pain, and nausea and vomiting. This now occurs every 3-4 days.  Patient was seen by Dr. Carol Ada from gastroenterology. She underwent colonoscopy on 04/06/2014. Multiple small polyps were identified in the rectosigmoid colon and were removed. The sigmoid colon was described as looping and tortuous and a pediatric colonoscope had to be utilized. Colonoscopy was successfully performed to the cecum.  Patient is taking medication for her irritable bowel symptoms. This appears to be helping. However the patient would like to have further evaluation and if necessary, surgical intervention, for her symptoms which she relates to adhesions.  PERTINENT REVIEW OF SYSTEMS: Intermittent abdominal distention, abdominal pain, nausea and vomiting.  EXAM: HEENT: normocephalic; pupils equal and reactive; sclerae clear; dentition good; mucous membranes moist NECK:  symmetric on extension; no palpable anterior or posterior cervical lymphadenopathy; no supraclavicular masses; no tenderness CHEST: clear to auscultation  bilaterally without rales, rhonchi, or wheezes CARDIAC: regular rate and rhythm without significant murmur; peripheral pulses are full ABDOMEN: Soft without distention; normal bowel sounds are present; well-healed surgical wounds consistent with laparoscopic cholecystectomy and laparoscopic appendectomy; well-healed Pfannenstiel incision without evidence of herniation; no palpable masses, no significant tenderness EXT:  non-tender without edema; no deformity NEURO: no gross focal deficits; no sign of tremor   IMPRESSION: Chronic intermittent abdominal pain and distention of undetermined etiology  PLAN: The patient and I discussed the above findings at length. We discussed her previous evaluation. We discussed adhesions, therefore mesh in, and there surgical management.  I've recommended a barium enema to define her colonic anatomy and to rule out any significant areas of distortion or obstruction. I will review this study and if there is no obvious source of her symptoms evident, we will consider small bowel follow-through series as a separate test.  Barium enema will be ordered and I will contact the patient with the results when they are available.  [name], MD, [facility] Office: [phone]  Visit Diagnoses: 1. ABDOMINAL PAIN OTHER SPECIFIED SITE

## 2014-05-25 ENCOUNTER — Telehealth (INDEPENDENT_AMBULATORY_CARE_PROVIDER_SITE_OTHER): Payer: Self-pay | Admitting: *Deleted

## 2014-05-25 NOTE — Telephone Encounter (Signed)
Patient aware of Dg. Colon 06/01/14@745 

## 2014-05-25 NOTE — Telephone Encounter (Signed)
[facility] regarding DG Colon that Dr. Harlow Asa ordered.  Advise pt that it is scheduled for 06-01-14 arriving at 7:45 a.m. At [facility].  Pt already has prep kit. Just advise pt to follow those instructions.   Anderson Malta

## 2014-06-01 ENCOUNTER — Ambulatory Visit
Admission: RE | Admit: 2014-06-01 | Discharge: 2014-06-01 | Disposition: A | Discharge status: Home / Self Care | Payer: 59 | Source: Ambulatory Visit | Attending: Surgery | Admitting: Surgery

## 2014-06-01 ENCOUNTER — Other Ambulatory Visit (INDEPENDENT_AMBULATORY_CARE_PROVIDER_SITE_OTHER): Payer: Self-pay | Admitting: Surgery

## 2014-06-01 DIAGNOSIS — R109 Unspecified abdominal pain: Secondary | ICD-10-CM

## 2014-06-01 NOTE — Progress Notes (Signed)
Quick Note:  Barium enema shows slightly tortuous sigmoid colon. No sign of obstruction or other abnormality. No sign of significant adhesions.  Will proceed with small bowel follow through study as discussed at her office consultation. My nurse will contact her to set up the study.  No role for surgical intervention based on barium enema study.  [name], MD, [facility] Office: [phone]    ______

## 2014-06-05 ENCOUNTER — Telehealth (INDEPENDENT_AMBULATORY_CARE_PROVIDER_SITE_OTHER): Payer: Self-pay

## 2014-06-05 ENCOUNTER — Other Ambulatory Visit (INDEPENDENT_AMBULATORY_CARE_PROVIDER_SITE_OTHER): Payer: Self-pay

## 2014-06-05 DIAGNOSIS — R140 Abdominal distension (gaseous): Secondary | ICD-10-CM

## 2014-06-05 NOTE — Telephone Encounter (Signed)
Per Dr Gala Lewandowsky request order placed in epic for SB series. Order to ref coord to set up test and call pt. Pt aware of BE result.

## 2014-06-11 ENCOUNTER — Ambulatory Visit
Admission: RE | Admit: 2014-06-11 | Discharge: 2014-06-11 | Disposition: A | Discharge status: Home / Self Care | Payer: 59 | Source: Ambulatory Visit | Attending: Surgery | Admitting: Surgery

## 2014-06-11 ENCOUNTER — Telehealth (INDEPENDENT_AMBULATORY_CARE_PROVIDER_SITE_OTHER): Payer: Self-pay

## 2014-06-11 DIAGNOSIS — R140 Abdominal distension (gaseous): Secondary | ICD-10-CM

## 2014-06-11 NOTE — Telephone Encounter (Signed)
Pt notified of normal small bowel follow thru.

## 2014-06-15 ENCOUNTER — Telehealth (INDEPENDENT_AMBULATORY_CARE_PROVIDER_SITE_OTHER): Payer: Self-pay | Admitting: Surgery

## 2014-06-15 NOTE — Telephone Encounter (Signed)
Telephone call to patient.  Lengthy discussion of barium enema result and SBFT result.  I do not recommend surgery for treatment of her symptoms based on these studies.  Appears to be a functional GI problem.  Patient has met with [name] earlier this week.  Further testing to be arranged.  May require referral to GI specialty clinic (IBS, etc) at tertiary care center if further studies unrevealing.  Will be happy to see for surgical care in the future if necessary.  [name], MD, [facility] Office: [phone]

## 2014-07-23 ENCOUNTER — Telehealth: Payer: Self-pay | Admitting: Internal Medicine

## 2014-07-23 NOTE — Telephone Encounter (Signed)
Pt called to ask for a rx on smoking patch. She said it is an over the counter drug but in order for her insurance to pay for it she need a rx for 90 days

## 2014-07-24 MED ORDER — NICOTINE POLACRILEX 2 MG MT LOZG: 2.0000 mg | LOZENGE | OROMUCOSAL | Status: DC | PRN

## 2014-07-24 MED ORDER — NICOTINE 21 MG/24HR TD PT24: 21.0000 mg | MEDICATED_PATCH | Freq: Every day | TRANSDERMAL | Status: DC

## 2014-07-24 NOTE — Telephone Encounter (Signed)
[facility] for rx for transdermal nicotine.  She should use 21 mg if she smokes 1ppd, 14 mg if 1/2 pdd, 7 mg if 1/4 ppd.  Remind pt not to smoke while she has patch on.  If she still has strong urge to smoke, use otc nicotine gum or lozenge (104m).  [place]

## 2014-07-24 NOTE — Telephone Encounter (Signed)
Left message for pt to call back  °

## 2014-07-24 NOTE — Telephone Encounter (Signed)
Pt smokes 1 1/2 packs per day.  Called in the 21 mg patches.  She also prefers the lozenges cause she can't chew gum.  Sent both in electronically to [facility]

## 2014-09-05 ENCOUNTER — Other Ambulatory Visit: Payer: Self-pay | Admitting: Internal Medicine

## 2014-09-06 ENCOUNTER — Telehealth: Payer: Self-pay | Admitting: Internal Medicine

## 2014-09-06 NOTE — Telephone Encounter (Signed)
Needs smoking cessation meds refilled and has an appt sch for 10/19/14.

## 2014-09-07 MED ORDER — NICOTINE 21 MG/24HR TD PT24: 21.0000 mg | MEDICATED_PATCH | Freq: Every day | TRANSDERMAL | Status: DC

## 2014-09-07 MED ORDER — NICOTINE POLACRILEX 2 MG MT LOZG: 2.0000 mg | LOZENGE | OROMUCOSAL | Status: DC | PRN

## 2014-09-07 NOTE — Telephone Encounter (Signed)
rx sent in electronically 

## 2014-09-07 NOTE — Telephone Encounter (Signed)
Hasn't been seen since 08/2013.  Please advise

## 2014-09-07 NOTE — Telephone Encounter (Signed)
Ok for RF x 2

## 2014-10-08 ENCOUNTER — Other Ambulatory Visit: Payer: Self-pay | Admitting: Internal Medicine

## 2014-10-19 ENCOUNTER — Encounter: Payer: Self-pay | Admitting: Internal Medicine

## 2014-10-19 ENCOUNTER — Ambulatory Visit (INDEPENDENT_AMBULATORY_CARE_PROVIDER_SITE_OTHER): Payer: 59 | Admitting: Internal Medicine

## 2014-10-19 VITALS — BP 124/76 | Temp 98.7°F | Ht 65.0 in | Wt 184.0 lb

## 2014-10-19 DIAGNOSIS — G8929 Other chronic pain: Secondary | ICD-10-CM

## 2014-10-19 DIAGNOSIS — R112 Nausea with vomiting, unspecified: Secondary | ICD-10-CM

## 2014-10-19 DIAGNOSIS — R1084 Generalized abdominal pain: Secondary | ICD-10-CM

## 2014-10-19 DIAGNOSIS — E785 Hyperlipidemia, unspecified: Secondary | ICD-10-CM

## 2014-10-19 LAB — CBC WITH DIFFERENTIAL/PLATELET
Basophils Absolute: 0 10*3/uL (ref 0.0–0.1)
Basophils Relative: 0 % (ref 0–1)
Eosinophils Absolute: 0.2 10*3/uL (ref 0.0–0.7)
Eosinophils Relative: 2 % (ref 0–5)
HCT: 40.7 % (ref 36.0–46.0)
Hemoglobin: 14 g/dL (ref 12.0–15.0)
Lymphocytes Relative: 46 % (ref 12–46)
Lymphs Abs: 4.3 10*3/uL — ABNORMAL HIGH (ref 0.7–4.0)
MCH: 32.1 pg (ref 26.0–34.0)
MCHC: 34.4 g/dL (ref 30.0–36.0)
MCV: 93.3 fL (ref 78.0–100.0)
MPV: 10.7 fL (ref 9.4–12.4)
Monocytes Absolute: 0.5 10*3/uL (ref 0.1–1.0)
Monocytes Relative: 5 % (ref 3–12)
Neutro Abs: 4.4 10*3/uL (ref 1.7–7.7)
Neutrophils Relative %: 47 % (ref 43–77)
Platelets: 293 10*3/uL (ref 150–400)
RBC: 4.36 MIL/uL (ref 3.87–5.11)
RDW: 12.9 % (ref 11.5–15.5)
WBC: 9.4 10*3/uL (ref 4.0–10.5)

## 2014-10-19 MED ORDER — RIFAXIMIN 550 MG PO TABS: 550.0000 mg | ORAL_TABLET | Freq: Two times a day (BID) | ORAL | Status: DC

## 2014-10-19 MED ORDER — RIFAXIMIN 550 MG PO TABS: 550.0000 mg | ORAL_TABLET | Freq: Three times a day (TID) | ORAL | Status: DC

## 2014-10-19 NOTE — Progress Notes (Signed)
Subjective:    Patient ID: [name], female    DOB: 11/12/1960, 53 y.o.   MRN: [id number]  HPI  53 year old white female with history of hyperlipidemia and gallstones for routine follow-up. Interval medical history - patient underwent extensive gastrointestinal workup for symptoms of severe abdominal bloating. Patient also complained of intermittent nausea vomiting and diarrhea.  Patient reports undergoing colonoscopy completed by [name].  Patient noted to have tortuous sigmoid colon. She was later referred for barium enema. It was negative for obstruction or other abnormality.  She underwent HIDA scan. It was positive for biliary dyskinesia. She underwent laparoscopic cholecystectomy. It only slightly improved her gastrointestinal symptoms.  She still has recurrent abdominal bloating.  Her symptoms are intermittent.  She usually has warning that she is going to have flare when she passes pellet like stools in the morning.  She questions whether she has celiac sprue.    She denies chronic diarrhea.  She has loose stools only after drinking coffee.  CT of abdomen and pelvis was completed on 03/09/2014. It was notable for #1 cholelithiasis without CT evidence of acute cholecystitis or biliary ductal dilatation. #2 hepatic steatosis. #3 no other acute intra-abdominal or pelvic process. Patient has history of complete abdominal hysterectomy.  [name] requested patient be referred to [facility]. Patient unable to schedule consultation with specialist.  Review of Systems Negative for weight loss.  Her symptoms are not triggered by food.    Past Medical History  Diagnosis Date  . Depression   . Chronic headaches   . Gallstones   . Personal history of colonic polyps 03/09/2014    tubular adenoma 2011   . NASH (nonalcoholic steatohepatitis) 03/21/2010    Qualifier: Diagnosis of  By: [name] MD, [name]   . Acute appendicitis with rupture 2009  . OSA  (obstructive sleep apnea) 10/14/2012    Sleep study completed on 09/22/2012-interpreting physician Clinton D. Young MD  Impression: 1. Mild ulcerative sleep apnea/Cytoxan syndrome, AHI 7.1 per hour. 2. Moderate scarring with oxygen saturations were -87% with mean saturation to the study night at 92% on room air. 3. Regular cardiac rhythm and the heart rate is 60 beats per minute.   . Complication of anesthesia     trouble waking up    History   Social History  . Marital Status: Married    Spouse Name: N/A    Number of Children: N/A  . Years of Education: N/A   Occupational History  . Director of compensation and benefits     [employer]   Social History Main Topics  . Smoking status: Current Every Day Smoker -- 1.00 packs/day for 32 years    Types: Cigarettes  . Smokeless tobacco: Never Used  . Alcohol Use: Yes     Comment: occa  . Drug Use: No  . Sexual Activity: Yes    Birth Control/ Protection: Surgical   Other Topics Concern  . Not on file   Social History Narrative   She is originally from [place], but grew up in [place].   She works as Information systems manager    She has been married 12 years.   No children    Past Surgical History  Procedure Laterality Date  . Laparoscopic appendectomy  2009    [name]  . Abdominal hysterectomy  2010    [name]  . Temporomandibular joint arthroplasty    . Tonsillectomy    . Colonoscopy  w/ biopsies  2011    Dr [name]  . Esophagogastroduodenoscopy  2011    Dr [name]  . Cholecystectomy N/A 03/10/2014    Procedure: LAPAROSCOPIC CHOLECYSTECTOMY WITH INTRAOPERATIVE CHOLANGIOGRAM;  Surgeon: [name], MD;  Location: [facility];  Service: General;  Laterality: N/A;    Family History  Problem Relation Age of Onset  . Hyperlipidemia    . Heart disease Father   . Hypertension Father   . Diabetes Mother   . Alcohol abuse Maternal Grandfather   . Arthritis    . Sudden death    .  Hemochromatosis Father     Allergies  Allergen Reactions  . Shellfish Allergy Swelling    Throat & mouth swell. Her blood pressure goes up.  . Amoxicillin-Pot Clavulanate Rash, Other (See Comments) and Hypertension    Extreme headache    Current Outpatient Prescriptions on File Prior to Visit  Medication Sig Dispense Refill  . [facility] NTS STEP 1 21 MG/24HR patch PLACE 1 PATCH (21 MG TOTAL) ONTO THE SKIN DAILY. 28 patch 0  . nicotine polacrilex (NICOTINE MINI) 2 MG lozenge Take 1 lozenge (2 mg total) by mouth as needed for smoking cessation. 100 tablet 0   No current facility-administered medications on file prior to visit.    BP 124/76 mmHg  Temp(Src) 98.7 F (37.1 C) (Oral)  Ht 5' 5"  (1.651 m)  Wt 184 lb (83.462 kg)  BMI 30.62 kg/m2    Objective:   Physical Exam  Constitutional: She is oriented to person, place, and time. She appears well-developed and well-nourished. No distress.  Cardiovascular: Normal rate, regular rhythm and normal heart sounds.   No murmur heard. Pulmonary/Chest: Effort normal and breath sounds normal. She has no wheezes.  Musculoskeletal: She exhibits no edema.  Neurological: She is alert and oriented to person, place, and time. No cranial nerve deficit.  Psychiatric: She has a normal mood and affect. Her behavior is normal.          Assessment & Plan:

## 2014-10-19 NOTE — Progress Notes (Signed)
Pre visit review using our clinic review tool, if applicable. No additional management support is needed unless otherwise documented below in the visit note. 

## 2014-10-19 NOTE — Patient Instructions (Signed)
Please complete the following lab tests before your next follow up appointment:

## 2014-10-19 NOTE — Assessment & Plan Note (Addendum)
Patient has unexplained severe abdominal bloating with associated nausea vomiting and diarrhea. Previous extensive gastrointestinal workup negative. Consider possibility of small bowel bacterial overgrowth. Trial of Xifaxan 550 mg 3 times a day for 14 days.  I doubt her symptoms are from celiac sprue. Check celiac panel.

## 2014-10-19 NOTE — Assessment & Plan Note (Addendum)
She has not been taking her cholesterol medication.  She reports taking "ginger" supplement.  Check lipid panel.

## 2014-10-20 LAB — BASIC METABOLIC PANEL
BUN: 15 mg/dL (ref 6–23)
CO2: 25 mEq/L (ref 19–32)
Calcium: 9.5 mg/dL (ref 8.4–10.5)
Chloride: 102 mEq/L (ref 96–112)
Creat: 0.98 mg/dL (ref 0.50–1.10)
Glucose, Bld: 126 mg/dL — ABNORMAL HIGH (ref 70–99)
Potassium: 4.3 mEq/L (ref 3.5–5.3)
Sodium: 141 mEq/L (ref 135–145)

## 2014-10-20 LAB — LIPID PANEL
Cholesterol: 247 mg/dL — ABNORMAL HIGH (ref 0–200)
HDL: 46 mg/dL (ref >39)
LDL Cholesterol: 141 mg/dL — ABNORMAL HIGH (ref 0–99)
Total CHOL/HDL Ratio: 5.4 Ratio
Triglycerides: 300 mg/dL — ABNORMAL HIGH (ref <150)
VLDL: 60 mg/dL — ABNORMAL HIGH (ref 0–40)

## 2014-10-20 LAB — HEPATIC FUNCTION PANEL
ALT: 82 U/L — ABNORMAL HIGH (ref 0–35)
AST: 43 U/L — ABNORMAL HIGH (ref 0–37)
Albumin: 4.4 g/dL (ref 3.5–5.2)
Alkaline Phosphatase: 71 U/L (ref 39–117)
Bilirubin, Direct: <0.1 mg/dL (ref 0.0–0.3)
Total Bilirubin: 0.2 mg/dL (ref 0.2–1.2)
Total Protein: 7.2 g/dL (ref 6.0–8.3)

## 2014-10-22 ENCOUNTER — Telehealth: Payer: Self-pay | Admitting: Internal Medicine

## 2014-10-22 LAB — CELIAC PANEL 10
Endomysial Screen: NEGATIVE
Gliadin IgA: 11 U/mL (ref <20)
Gliadin IgG: 3 U/mL (ref <20)
IgA: 279 mg/dL (ref 69–380)
Tissue Transglut Ab: 2 U/mL (ref <6)
Tissue Transglutaminase Ab, IgA: 1 U/mL (ref <4)

## 2014-10-22 NOTE — Telephone Encounter (Signed)
emmi emailed °

## 2014-11-23 ENCOUNTER — Ambulatory Visit: Payer: 59 | Admitting: Internal Medicine

## 2014-12-19 ENCOUNTER — Encounter: Payer: Self-pay | Admitting: Internal Medicine

## 2014-12-19 ENCOUNTER — Ambulatory Visit (INDEPENDENT_AMBULATORY_CARE_PROVIDER_SITE_OTHER): Payer: BLUE CROSS/BLUE SHIELD | Admitting: Internal Medicine

## 2014-12-19 VITALS — BP 110/70 | Temp 98.2°F | Wt 186.0 lb

## 2014-12-19 DIAGNOSIS — L02439 Carbuncle of limb, unspecified: Secondary | ICD-10-CM | POA: Insufficient documentation

## 2014-12-19 DIAGNOSIS — K589 Irritable bowel syndrome without diarrhea: Secondary | ICD-10-CM

## 2014-12-19 MED ORDER — MUPIROCIN 2 % EX OINT: 1.0000 "application " | TOPICAL_OINTMENT | Freq: Two times a day (BID) | CUTANEOUS | Status: DC

## 2014-12-19 MED ORDER — DICYCLOMINE HCL 10 MG PO CAPS: 10.0000 mg | ORAL_CAPSULE | Freq: Three times a day (TID) | ORAL | Status: DC | PRN

## 2014-12-19 NOTE — Assessment & Plan Note (Signed)
Patient reports chronic history of intermittent boils. They are usually located near medial upper thigh area.  We discussed possibility of MRSA.  Patient advised to soak in Epson salt baths when she becomes symptomatic. Trial of Bactroban to nares to reduce MRSA colonization.

## 2014-12-19 NOTE — Assessment & Plan Note (Addendum)
Patient has constellation of symptoms consistent with possible IBS. Her constipation symptoms significantly improved with use of Xifaxan. She still experiences intermittent abdominal cramps. Patient advised to use Bentyl 10 mg 3 times a day as needed. Patient also advised to restart fiber supplementation.  If loose stools become problematic, consider trial of bile acid sequestrant considering history of cholecystectomy.  Celiac sprue panel was negative.

## 2014-12-19 NOTE — Progress Notes (Signed)
Pre visit review using our clinic review tool, if applicable. No additional management support is needed unless otherwise documented below in the visit note. 

## 2014-12-19 NOTE — Progress Notes (Signed)
Subjective:    Patient ID: [name], female    DOB: 1961/07/25, 54 y.o.   MRN: [id number]  HPI  54 year old white female previously seen for IBS symptoms for follow-up. Serologic testing for celiac sprue was negative. Patient was treated with Xifaxan for intermittent severe constipation and abdominal bloating. Patient reports constipation symptoms have resolved since taking antibiotic.  Patient still has loose stools with intermittent abdominal cramping.  Patient also complains of intermittent boils near upper inner thigh area/perineal area.  This has been ongoing for years. Patient usually self treats with warm compress.   Review of Systems Negative for weight change, negative for watery stools    Past Medical History  Diagnosis Date  . Depression   . Chronic headaches   . Gallstones   . Personal history of colonic polyps 03/09/2014    tubular adenoma 2011   . NASH (nonalcoholic steatohepatitis) 03/21/2010    Qualifier: Diagnosis of  By: [name] MD, [name]   . Acute appendicitis with rupture 2009  . OSA (obstructive sleep apnea) 10/14/2012    Sleep study completed on 09/22/2012-interpreting physician Clinton D. Young MD  Impression: 1. Mild ulcerative sleep apnea/Cytoxan syndrome, AHI 7.1 per hour. 2. Moderate scarring with oxygen saturations were -87% with mean saturation to the study night at 92% on room air. 3. Regular cardiac rhythm and the heart rate is 60 beats per minute.   . Complication of anesthesia     trouble waking up    History   Social History  . Marital Status: Married    Spouse Name: N/A  . Number of Children: N/A  . Years of Education: N/A   Occupational History  . Director of compensation and benefits     [employer]   Social History Main Topics  . Smoking status: Current Every Day Smoker -- 1.00 packs/day for 32 years    Types: Cigarettes  . Smokeless tobacco: Never Used  . Alcohol Use: Yes     Comment: occa  . Drug Use: No  .  Sexual Activity: Yes    Birth Control/ Protection: Surgical   Other Topics Concern  . Not on file   Social History Narrative   She is originally from [place], but grew up in [place].   She works as Information systems manager    She has been married 12 years.   No children    Past Surgical History  Procedure Laterality Date  . Laparoscopic appendectomy  2009    [name]  . Abdominal hysterectomy  2010    [name]  . Temporomandibular joint arthroplasty    . Tonsillectomy    . Colonoscopy w/ biopsies  2011    Dr [name]  . Esophagogastroduodenoscopy  2011    Dr [name]  . Cholecystectomy N/A 03/10/2014    Procedure: LAPAROSCOPIC CHOLECYSTECTOMY WITH INTRAOPERATIVE CHOLANGIOGRAM;  Surgeon: [name], MD;  Location: [facility];  Service: General;  Laterality: N/A;    Family History  Problem Relation Age of Onset  . Hyperlipidemia    . Heart disease Father   . Hypertension Father   . Diabetes Mother   . Alcohol abuse Maternal Grandfather   . Arthritis    . Sudden death    . Hemochromatosis Father     Allergies  Allergen Reactions  . Shellfish Allergy Swelling    Throat & mouth swell. Her blood pressure goes up.  . Amoxicillin-Pot Clavulanate Rash, Other (See Comments) and Hypertension  Extreme headache    Current Outpatient Prescriptions on File Prior to Visit  Medication Sig Dispense Refill  . [facility] NTS STEP 1 21 MG/24HR patch PLACE 1 PATCH (21 MG TOTAL) ONTO THE SKIN DAILY. 28 patch 0  . nicotine polacrilex (NICOTINE MINI) 2 MG lozenge Take 1 lozenge (2 mg total) by mouth as needed for smoking cessation. (Patient not taking: Reported on 12/19/2014) 100 tablet 0   No current facility-administered medications on file prior to visit.    BP 110/70 mmHg  Temp(Src) 98.2 F (36.8 C) (Oral)  Wt 186 lb (84.369 kg)    Objective:   Physical Exam  Constitutional: She is oriented to person, place, and time. She appears well-developed and  well-nourished. No distress.  HENT:  Head: Normocephalic and atraumatic.  Cardiovascular: Normal rate, regular rhythm and normal heart sounds.   Pulmonary/Chest: Effort normal and breath sounds normal.  Abdominal: Soft. Bowel sounds are normal. There is no tenderness.  Neurological: She is alert and oriented to person, place, and time.  Skin: Skin is warm and dry.  Psychiatric: She has a normal mood and affect. Her behavior is normal.          Assessment & Plan:

## 2015-07-31 ENCOUNTER — Ambulatory Visit (INDEPENDENT_AMBULATORY_CARE_PROVIDER_SITE_OTHER): Payer: BLUE CROSS/BLUE SHIELD | Admitting: Family Medicine

## 2015-07-31 ENCOUNTER — Encounter: Payer: Self-pay | Admitting: Family Medicine

## 2015-07-31 VITALS — BP 120/80 | HR 88 | Temp 97.0°F | Ht 65.0 in | Wt 183.1 lb

## 2015-07-31 DIAGNOSIS — R112 Nausea with vomiting, unspecified: Secondary | ICD-10-CM

## 2015-07-31 DIAGNOSIS — K589 Irritable bowel syndrome without diarrhea: Secondary | ICD-10-CM

## 2015-07-31 DIAGNOSIS — R140 Abdominal distension (gaseous): Secondary | ICD-10-CM | POA: Diagnosis not present

## 2015-07-31 MED ORDER — RIFAXIMIN 550 MG PO TABS: 550.0000 mg | ORAL_TABLET | Freq: Three times a day (TID) | ORAL | Status: DC

## 2015-07-31 NOTE — Patient Instructions (Signed)
Let us know if not responding to the Xifaxan.   Follow up immediately for any persistent vomiting, fever, or increased abdominal pain

## 2015-07-31 NOTE — Progress Notes (Signed)
Pre visit review using our clinic review tool, if applicable. No additional management support is needed unless otherwise documented below in the visit note. 

## 2015-07-31 NOTE — Progress Notes (Signed)
Subjective:    Patient ID: [name], female    DOB: [birth year], 54 y.o.   MRN: [id number]  HPI Patient seen with intermittent and chronic GI complaints. She states for about 7 years now she's had intermittent problems with nausea, vomiting, bloatedness and diarrhea. She states she had appendectomy about 7 years ago followed by hysterectomy. She thinks she may have had some adhesions since then. She's had extensive GI workup. She had colonoscopy 2015 with very tortuous sigmoid colon and eventually saw surgeon had barium enema small bowel follow-through which was unremarkable. She has episodes of diarrhea sometimes alternating with constipation. At one point she was taking Amitiza per GI which helped-though she does not seem to have recollection of this.  She was seen here by her primary last December and celiac antibody panel negative. She was treated with Xifaxan 550 mg 3 times daily for 14 days and had several months of resolution of symptoms. She had recurrence of above symptoms now for about a week. No hematemesis. Bloating is diffuse. No reported appetite or weight changes She has history of chronic fatty liver changes and ongoing nicotine use. No history of lactose intolerance  Past Medical History  Diagnosis Date  . Depression   . Chronic headaches   . Gallstones   . Personal history of colonic polyps 03/09/2014    tubular adenoma 2011   . NASH (nonalcoholic steatohepatitis) 03/21/2010    Qualifier: Diagnosis of  By: [name] MD, [name]   . Acute appendicitis with rupture 2009  . OSA (obstructive sleep apnea) 10/14/2012    Sleep study completed on 09/22/2012-interpreting physician Clinton D. Young MD  Impression: 1. Mild ulcerative sleep apnea/Cytoxan syndrome, AHI 7.1 per hour. 2. Moderate scarring with oxygen saturations were -87% with mean saturation to the study night at 92% on room air. 3. Regular cardiac rhythm and the heart rate is 60 beats per minute.   . Complication of  anesthesia     trouble waking up   Past Surgical History  Procedure Laterality Date  . Laparoscopic appendectomy  2009    [name]  . Abdominal hysterectomy  2010    [name]  . Temporomandibular joint arthroplasty    . Tonsillectomy    . Colonoscopy w/ biopsies  2011    Dr [name]  . Esophagogastroduodenoscopy  2011    Dr [name]  . Cholecystectomy N/A 03/10/2014    Procedure: LAPAROSCOPIC CHOLECYSTECTOMY WITH INTRAOPERATIVE CHOLANGIOGRAM;  Surgeon: [name], MD;  Location: [facility];  Service: General;  Laterality: N/A;    reports that she has been smoking Cigarettes.  She has a 32 pack-year smoking history. She has never used smokeless tobacco. She reports that she drinks alcohol. She reports that she does not use illicit drugs. family history includes Alcohol abuse in her maternal grandfather; Arthritis in an other family member; Diabetes in her mother; Heart disease in her father; Hemochromatosis in her father; Hyperlipidemia in an other family member; Hypertension in her father; Sudden death in an other family member. Allergies  Allergen Reactions  . Shellfish Allergy Swelling    Throat & mouth swell. Her blood pressure goes up.  . Amoxicillin-Pot Clavulanate Rash, Other (See Comments) and Hypertension    Extreme headache      Review of Systems  Constitutional: Negative for fever, chills, appetite change and unexpected weight change.  Respiratory: Negative for shortness of breath.   Cardiovascular: Negative for chest pain.  Gastrointestinal: Positive for nausea, vomiting, abdominal  pain, diarrhea and abdominal distention. Negative for blood in stool.  Genitourinary: Negative for dysuria.  Neurological: Negative for dizziness.       Objective:   Physical Exam  Constitutional: She appears well-developed and well-nourished. No distress.  Neck: Neck supple. No thyromegaly present.  Cardiovascular: Normal rate and regular rhythm.   Pulmonary/Chest: Effort normal  and breath sounds normal. No respiratory distress. She has no wheezes. She has no rales.  Abdominal: Soft. Bowel sounds are normal. She exhibits no distension and no mass. There is no tenderness. There is no rebound and no guarding.  Musculoskeletal: She exhibits no edema.          Assessment & Plan:  Patient has long history of intermittent abdominal bloating, nausea, diarrhea sometimes alternating with constipation. Suspect largely related IBS. She has normal bowel sounds today and no clinical evidence for partial or complete small bowel obstruction. She has responded very well in the past to Xifaxan and we agreed to start back 550 mg 3 times a day for 14 days. If symptoms not improving with the above or recurring we've recommended follow-up with GI to discuss other possible options such as Linzess.

## 2015-08-05 ENCOUNTER — Telehealth: Payer: Self-pay | Admitting: Internal Medicine

## 2015-08-05 DIAGNOSIS — K589 Irritable bowel syndrome without diarrhea: Secondary | ICD-10-CM

## 2015-08-05 NOTE — Telephone Encounter (Signed)
Pt call to say that Dr Elease Hashimoto told her he would refer her to a specailist IBS. She said she would like to go to [facility]

## 2015-08-06 NOTE — Telephone Encounter (Signed)
OK to refer to [facility].  I dont know specifically who in their dept. Sees patients with IBS but would make referral to someone there who does.

## 2015-08-07 NOTE — Telephone Encounter (Signed)
Referral placed.

## 2015-08-28 DIAGNOSIS — R1115 Cyclical vomiting syndrome unrelated to migraine: Secondary | ICD-10-CM | POA: Insufficient documentation

## 2016-05-04 ENCOUNTER — Emergency Department (HOSPITAL_COMMUNITY): Payer: BLUE CROSS/BLUE SHIELD

## 2016-05-04 ENCOUNTER — Emergency Department (HOSPITAL_COMMUNITY)
Admission: EM | Admit: 2016-05-04 | Discharge: 2016-05-04 | Disposition: A | Discharge status: Home / Self Care | Payer: BLUE CROSS/BLUE SHIELD | Attending: Emergency Medicine | Admitting: Emergency Medicine

## 2016-05-04 ENCOUNTER — Encounter (HOSPITAL_COMMUNITY): Payer: Self-pay | Admitting: Emergency Medicine

## 2016-05-04 DIAGNOSIS — R51 Headache: Secondary | ICD-10-CM | POA: Diagnosis present

## 2016-05-04 DIAGNOSIS — F329 Major depressive disorder, single episode, unspecified: Secondary | ICD-10-CM | POA: Insufficient documentation

## 2016-05-04 DIAGNOSIS — Z79899 Other long term (current) drug therapy: Secondary | ICD-10-CM | POA: Diagnosis not present

## 2016-05-04 DIAGNOSIS — F17210 Nicotine dependence, cigarettes, uncomplicated: Secondary | ICD-10-CM | POA: Insufficient documentation

## 2016-05-04 DIAGNOSIS — R519 Headache, unspecified: Secondary | ICD-10-CM

## 2016-05-04 LAB — CBC WITH DIFFERENTIAL/PLATELET
Basophils Absolute: 0 10*3/uL (ref 0.0–0.1)
Basophils Relative: 0 %
Eosinophils Absolute: 0.2 10*3/uL (ref 0.0–0.7)
Eosinophils Relative: 2 %
HCT: 38.9 % (ref 36.0–46.0)
Hemoglobin: 13.6 g/dL (ref 12.0–15.0)
Lymphocytes Relative: 42 %
Lymphs Abs: 4 10*3/uL (ref 0.7–4.0)
MCH: 32 pg (ref 26.0–34.0)
MCHC: 35 g/dL (ref 30.0–36.0)
MCV: 91.5 fL (ref 78.0–100.0)
Monocytes Absolute: 0.5 10*3/uL (ref 0.1–1.0)
Monocytes Relative: 6 %
Neutro Abs: 4.9 10*3/uL (ref 1.7–7.7)
Neutrophils Relative %: 50 %
Platelets: 236 10*3/uL (ref 150–400)
RBC: 4.25 MIL/uL (ref 3.87–5.11)
RDW: 12.9 % (ref 11.5–15.5)
WBC: 9.7 10*3/uL (ref 4.0–10.5)

## 2016-05-04 LAB — COMPREHENSIVE METABOLIC PANEL
ALT: 38 U/L (ref 14–54)
AST: 29 U/L (ref 15–41)
Albumin: 4.1 g/dL (ref 3.5–5.0)
Alkaline Phosphatase: 55 U/L (ref 38–126)
Anion gap: 7 (ref 5–15)
BUN: 15 mg/dL (ref 6–20)
CO2: 24 mmol/L (ref 22–32)
Calcium: 8.7 mg/dL — ABNORMAL LOW (ref 8.9–10.3)
Chloride: 106 mmol/L (ref 101–111)
Creatinine, Ser: 0.91 mg/dL (ref 0.44–1.00)
GFR calc Af Amer: >60 mL/min (ref >60)
GFR calc non Af Amer: >60 mL/min (ref >60)
Glucose, Bld: 90 mg/dL (ref 65–99)
Potassium: 3.8 mmol/L (ref 3.5–5.1)
Sodium: 137 mmol/L (ref 135–145)
Total Bilirubin: 0.9 mg/dL (ref 0.3–1.2)
Total Protein: 7 g/dL (ref 6.5–8.1)

## 2016-05-04 MED ORDER — KETOROLAC TROMETHAMINE 30 MG/ML IJ SOLN
30.0000 mg | Freq: Once | INTRAMUSCULAR | Status: AC
  Administered 2016-05-04: 30 mg via INTRAVENOUS
  Filled 2016-05-04: qty 1

## 2016-05-04 MED ORDER — ONDANSETRON HCL 4 MG/2ML IJ SOLN
4.0000 mg | Freq: Once | INTRAMUSCULAR | Status: AC
  Administered 2016-05-04: 4 mg via INTRAVENOUS
  Filled 2016-05-04: qty 2

## 2016-05-04 MED ORDER — HYDROMORPHONE HCL 1 MG/ML IJ SOLN
0.5000 mg | Freq: Once | INTRAMUSCULAR | Status: AC
  Administered 2016-05-04: 0.5 mg via INTRAVENOUS
  Filled 2016-05-04: qty 1

## 2016-05-04 MED ORDER — METHYLPREDNISOLONE SODIUM SUCC 125 MG IJ SOLR
125.0000 mg | Freq: Once | INTRAMUSCULAR | Status: AC
  Administered 2016-05-04: 125 mg via INTRAVENOUS
  Filled 2016-05-04: qty 2

## 2016-05-04 MED ORDER — PROCHLORPERAZINE EDISYLATE 5 MG/ML IJ SOLN
10.0000 mg | Freq: Once | INTRAMUSCULAR | Status: AC
  Administered 2016-05-04: 10 mg via INTRAVENOUS
  Filled 2016-05-04: qty 2

## 2016-05-04 MED ORDER — SODIUM CHLORIDE 0.9 % IV BOLUS (SEPSIS)
1000.0000 mL | Freq: Once | INTRAVENOUS | Status: AC
  Administered 2016-05-04: 1000 mL via INTRAVENOUS

## 2016-05-04 MED ORDER — BUTALBITAL-APAP-CAFFEINE 50-325-40 MG PO TABS: 1.0000 | ORAL_TABLET | Freq: Four times a day (QID) | ORAL | Status: DC | PRN

## 2016-05-04 NOTE — Discharge Instructions (Signed)
Please follow with your primary care doctor in the next 2 days for a check-up. They must obtain records for further management.   Do not hesitate to return to the Emergency Department for any new, worsening or concerning symptoms.    General Headache Without Cause A headache is pain or discomfort felt around the head or neck area. The specific cause of a headache may not be found. There are many causes and types of headaches. A few common ones are:  Tension headaches.  Migraine headaches.  Cluster headaches.  Chronic daily headaches. HOME CARE INSTRUCTIONS  Watch your condition for any changes. Take these steps to help with your condition: Managing Pain  Take over-the-counter and prescription medicines only as told by your health care provider.  Lie down in a dark, quiet room when you have a headache.  If directed, apply ice to the head and neck area:  Put ice in a plastic bag.  Place a towel between your skin and the bag.  Leave the ice on for 20 minutes, 2-3 times per day.  Use a heating pad or hot shower to apply heat to the head and neck area as told by your health care provider.  Keep lights dim if bright lights bother you or make your headaches worse. Eating and Drinking  Eat meals on a regular schedule.  Limit alcohol use.  Decrease the amount of caffeine you drink, or stop drinking caffeine. General Instructions  Keep all follow-up visits as told by your health care provider. This is important.  Keep a headache journal to help find out what may trigger your headaches. For example, write down:  What you eat and drink.  How much sleep you get.  Any change to your diet or medicines.  Try massage or other relaxation techniques.  Limit stress.  Sit up straight, and do not tense your muscles.  Do not use tobacco products, including cigarettes, chewing tobacco, or e-cigarettes. If you need help quitting, ask your health care provider.  Exercise regularly  as told by your health care provider.  Sleep on a regular schedule. Get 7-9 hours of sleep, or the amount recommended by your health care provider. SEEK MEDICAL CARE IF:   Your symptoms are not helped by medicine.  You have a headache that is different from the usual headache.  You have nausea or you vomit.  You have a fever. SEEK IMMEDIATE MEDICAL CARE IF:   Your headache becomes severe.  You have repeated vomiting.  You have a stiff neck.  You have a loss of vision.  You have problems with speech.  You have pain in the eye or ear.  You have muscular weakness or loss of muscle control.  You lose your balance or have trouble walking.  You feel faint or pass out.  You have confusion.   This information is not intended to replace advice given to you by your health care provider. Make sure you discuss any questions you have with your health care provider.   Document Released: 10/26/2005 Document Revised: 07/17/2015 Document Reviewed: 02/18/2015 Elsevier Interactive Patient Education [employer].

## 2016-05-04 NOTE — ED Notes (Signed)
Pt c/o nausea and generalized aching x several weeks, new onset headache and backache, photophobia 3-4 days ago. Pt diagnosed with Lyme and [facility] Spotted Fever about 10 days ago, just finished 10 day tx of doxycycline. No new symptoms today.

## 2016-05-04 NOTE — ED Provider Notes (Signed)
CSN: [id number]     Arrival date & time 05/04/16  1240 History   First MD Initiated Contact with Patient 05/04/16 1347     Chief Complaint  Patient presents with  . Headache     (Consider location/radiation/quality/duration/timing/severity/associated sxs/prior Treatment) HPI  Blood pressure 111/60, pulse 71, temperature 98.7 F (37.1 C), temperature source Oral, resp. rate 18, SpO2 96 %.  [name] is a 55 y.o. female sent from [place] urgent care for evaluation of headache and neck stiffness onset 4 days ago, patient has been treated with 10 days of doxycycline, she took her last dose this a.m., as per patient she has both Lyme and RMSF, she had a tick bite to the inguinal area 2 weeks ago, unsure if the tick was engorged. Patient states that she does typically get headaches similar to this in the occipital area however, she normally takes naproxen and rods a long sleeve shirt around her head and goes to bed and when she wake up that has resolved, it did resolve after she did this but it recurred, this is atypical for her. Headache is exacerbated by light and sound. Patient also states that she has some neck soreness. States that she has neck soreness associated with her headaches approximately 50% time when the headache is very severe., she's had low-grade fever, patient denies thunderclap onset, exacerbation with exertion or Valsalva, numbness, weakness, dysarthria, ataxia. Rates her pain at 7 out of 10, at its worse at 9 out of 10. She has not had any pain medication this morning.   Past Medical History  Diagnosis Date  . Depression   . Chronic headaches   . Gallstones   . Personal history of colonic polyps 03/09/2014    tubular adenoma 2011   . NASH (nonalcoholic steatohepatitis) 03/21/2010    Qualifier: Diagnosis of  By: [name] MD, [name]   . Acute appendicitis with rupture 2009  . OSA (obstructive sleep apnea) 10/14/2012    Sleep study completed on 09/22/2012-interpreting  physician Clinton D. Young MD  Impression: 1. Mild ulcerative sleep apnea/Cytoxan syndrome, AHI 7.1 per hour. 2. Moderate scarring with oxygen saturations were -87% with mean saturation to the study night at 92% on room air. 3. Regular cardiac rhythm and the heart rate is 60 beats per minute.   . Complication of anesthesia     trouble waking up   Past Surgical History  Procedure Laterality Date  . Laparoscopic appendectomy  2009    [name]  . Abdominal hysterectomy  2010    [name]  . Temporomandibular joint arthroplasty    . Tonsillectomy    . Colonoscopy w/ biopsies  2011    Dr [name]  . Esophagogastroduodenoscopy  2011    Dr [name]  . Cholecystectomy N/A 03/10/2014    Procedure: LAPAROSCOPIC CHOLECYSTECTOMY WITH INTRAOPERATIVE CHOLANGIOGRAM;  Surgeon: [name], MD;  Location: [facility];  Service: General;  Laterality: N/A;   Family History  Problem Relation Age of Onset  . Hyperlipidemia    . Heart disease Father   . Hypertension Father   . Diabetes Mother   . Alcohol abuse Maternal Grandfather   . Arthritis    . Sudden death    . Hemochromatosis Father    Social History  Substance Use Topics  . Smoking status: Current Every Day Smoker -- 1.00 packs/day for 32 years    Types: Cigarettes  . Smokeless tobacco: Never Used  . Alcohol Use: Yes  Comment: occa   OB History    No data available     Review of Systems  10 systems reviewed and found to be negative, except as noted in the HPI.  Allergies  Amoxicillin-pot clavulanate; Bee venom; and Shellfish allergy  Home Medications   Prior to Admission medications   Medication Sig Start Date End Date Taking? Authorizing Provider  doxycycline (MONODOX) 100 MG capsule Take 100 mg by mouth 2 (two) times daily. Started 06/16 for 10 days 04/23/16  Yes Historical Provider, MD  naproxen sodium (ANAPROX) 220 MG tablet Take 440 mg by mouth every 12 (twelve) hours as needed (for pain).   Yes Historical Provider, MD   butalbital-acetaminophen-caffeine (FIORICET) 50-325-40 MG tablet Take 1 tablet by mouth every 6 (six) hours as needed for headache. 05/04/16   Elmyra Ricks Alfrieda Tarry, PA-C   BP 99/58 mmHg  Pulse 83  Temp(Src) 98.7 F (37.1 C) (Oral)  Resp 18  SpO2 92% Physical Exam  Constitutional: She is oriented to person, place, and time. She appears well-developed and well-nourished. No distress.  Sunglasses and towel over her head  HENT:  Head: Normocephalic and atraumatic.  Mouth/Throat: Oropharynx is clear and moist.  Eyes: Conjunctivae and EOM are normal. Pupils are equal, round, and reactive to light.  Light sensitive  Neck: Normal range of motion. Neck supple.  FROM to C-spine. Pt can touch chin to chest without discomfort. No TTP of midline cervical spine.   Cardiovascular: Normal rate, regular rhythm and intact distal pulses.   Pulmonary/Chest: Effort normal and breath sounds normal. No respiratory distress. She has no wheezes. She has no rales. She exhibits no tenderness.  Abdominal: Soft. Bowel sounds are normal. There is no tenderness.  Musculoskeletal: Normal range of motion. She exhibits no edema or tenderness.  Neurological: She is alert and oriented to person, place, and time. No cranial nerve deficit.  II-Visual fields grossly intact. III/IV/VI-Extraocular movements intact.  Pupils reactive bilaterally. V/VII-Smile symmetric, equal eyebrow raise,  facial sensation intact VIII- Hearing grossly intact IX/X-Normal gag XI-bilateral shoulder shrug XII-midline tongue extension Motor: 5/5 bilaterally with normal tone and bulk Cerebellar: Normal finger-to-nose  and normal heel-to-shin test.   Romberg negative Ambulates with a coordinated gait   Skin: She is not diaphoretic.  Psychiatric: She has a normal mood and affect.  Nursing note and vitals reviewed.   ED Course  Procedures (including critical care time) Labs Review Labs Reviewed  COMPREHENSIVE METABOLIC PANEL - Abnormal;  Notable for the following:    Calcium 8.7 (*)    All other components within normal limits  CULTURE, BLOOD (ROUTINE X 2)  CULTURE, BLOOD (ROUTINE X 2)  CBC WITH DIFFERENTIAL/PLATELET    Imaging Review Ct Head Wo Contrast  05/04/2016  CLINICAL DATA:  Headaches.  New diagnosis of Lyme disease. EXAM: CT HEAD WITHOUT CONTRAST TECHNIQUE: Contiguous axial images were obtained from the base of the skull through the vertex without intravenous contrast. COMPARISON:  None. FINDINGS: No mass lesion. No midline shift. No acute hemorrhage or hematoma. No extra-axial fluid collections. No evidence of acute infarction. Diffuse slight cerebral cortical and cerebellar atrophy. Brain parenchyma is otherwise normal. Bones are normal. IMPRESSION: No acute abnormalities.  Slight diffuse atrophy. Electronically Signed   By: Lorriane Shire M.D.   On: 05/04/2016 15:21   I have personally reviewed and evaluated these images and lab results as part of my medical decision-making.   EKG Interpretation None      MDM   Final diagnoses:  Nonintractable headache,  unspecified chronicity pattern, unspecified headache type    Filed Vitals:   05/04/16 1300 05/04/16 1435 05/04/16 1600 05/04/16 1621  BP: 106/67 111/60 99/58 99/58   Pulse: 86 71 83 83  Temp: 98.7 F (37.1 C)     TempSrc: Oral     Resp: 16 18  18   SpO2: 97% 96% 90% 92%    Medications  prochlorperazine (COMPAZINE) injection 10 mg (not administered)  methylPREDNISolone sodium succinate (SOLU-MEDROL) 125 mg/2 mL injection 125 mg (not administered)  ketorolac (TORADOL) 30 MG/ML injection 30 mg (not administered)  HYDROmorphone (DILAUDID) injection 0.5 mg (0.5 mg Intravenous Given 05/04/16 1419)  sodium chloride 0.9 % bolus 1,000 mL (1,000 mLs Intravenous New Bag/Given 05/04/16 1419)  ondansetron (ZOFRAN) injection 4 mg (4 mg Intravenous Given 05/04/16 1419)    [name] is 55 y.o. female presenting with Headache exacerbation neck soreness, she  has no issue moving the neck and is moving it freely on my exam. Patient is afebrile in the ED. Neurologic exam is nonfocal. Given her recent history of RMSF and Lyme, concern for a possible rickettsial meningitis. In investigation of this on up-to-date Lyme meningitis is treated with the typical doses of doxycycline.  Case discussed with infectious disease physician Dr. Megan Salon, he's taken a look at the RMSF and Lyme titers, states that the Lyme titers were inappropriately considered positive, she has been treated for RMSF.   Given the fact that her headache is largely typical for her, she's had issues with migraines in the past, has never been examined by a neurologist and patient has no meningeal signs, do not think this patient needs a lumbar puncture. Patient will given the given a referral to neurology. This is a shared visit with the attending physician who has personally evaluated the patient and agrees with care plan and stability to discharge to home.  Evaluation does not show pathology that would require ongoing emergent intervention or inpatient treatment. Pt is hemodynamically stable and mentating appropriately. Discussed findings and plan with patient/guardian, who agrees with care plan. All questions answered. Return precautions discussed and outpatient follow up given.   New Prescriptions   BUTALBITAL-ACETAMINOPHEN-CAFFEINE (FIORICET) 50-325-40 MG TABLET    Take 1 tablet by mouth every 6 (six) hours as needed for headache.         Elmyra Ricks Jazmin Ley, PA-C 05/04/16 [place], MD 05/05/16 [phone]

## 2016-05-09 LAB — CULTURE, BLOOD (ROUTINE X 2)
Culture: NO GROWTH
Culture: NO GROWTH

## 2016-05-11 ENCOUNTER — Encounter: Payer: Self-pay | Admitting: Neurology

## 2016-05-11 ENCOUNTER — Ambulatory Visit (INDEPENDENT_AMBULATORY_CARE_PROVIDER_SITE_OTHER): Payer: BLUE CROSS/BLUE SHIELD | Admitting: Neurology

## 2016-05-11 VITALS — BP 102/66 | HR 83 | Ht 65.0 in | Wt 173.0 lb

## 2016-05-11 DIAGNOSIS — G43019 Migraine without aura, intractable, without status migrainosus: Secondary | ICD-10-CM | POA: Diagnosis not present

## 2016-05-11 MED ORDER — RIZATRIPTAN BENZOATE 10 MG PO TBDP: 10.0000 mg | ORAL_TABLET | Freq: Three times a day (TID) | ORAL | Status: DC | PRN

## 2016-05-11 MED ORDER — PREDNISONE 5 MG PO TABS: ORAL_TABLET | ORAL | Status: DC

## 2016-05-11 MED ORDER — TOPIRAMATE 25 MG PO TABS: ORAL_TABLET | ORAL | Status: DC

## 2016-05-11 NOTE — Progress Notes (Signed)
Reason for visit: Headache  Referring physician: [place]  [name] is a 55 y.o. female  History of present illness:  [name] is a 55 year old right-handed white female with a history of headaches dating back about 2 years. The patient does not recall having many headaches growing up. In general the headaches have been occurring 3 or 4 times a month, she will note that the headaches usually begin in the back of the head with a throbbing sensation, and then she gets sharp jabbing pains behind eyes bilaterally. She may have nausea and vomiting with the headache, she may have spots in front of the eyes, and photophobia with the headaches but no phonophobia. She indicates that sleep will help the headache, and if she vomits this will also reduce the headache. The patient may take Aleve or Motrin with some benefit. She has recently been given Fioricet which also helps the headaches. Over the last 2 weeks the headaches have converted to being daily in nature. She was seen in the emergency room on 05/04/2016, a CT scan of the head was done and was unremarkable. She has noted that certain odors may bring on headaches, and possibly weather changes. The patient may have some dizziness with the headache and she reports some neck stiffness. She does not know of any family history of migraine. She comes to this office for an evaluation.  Past Medical History  Diagnosis Date  . Depression   . Chronic headaches   . Gallstones   . Personal history of colonic polyps 03/09/2014    tubular adenoma 2011   . NASH (nonalcoholic steatohepatitis) 03/21/2010    Qualifier: Diagnosis of  By: [name] MD, [name]   . Acute appendicitis with rupture 2009  . OSA (obstructive sleep apnea) 10/14/2012    Sleep study completed on 09/22/2012-interpreting physician Clinton D. Young MD  Impression: 1. Mild ulcerative sleep apnea/Cytoxan syndrome, AHI 7.1 per hour. 2. Moderate scarring with oxygen saturations were -87%  with mean saturation to the study night at 92% on room air. 3. Regular cardiac rhythm and the heart rate is 60 beats per minute.   . Complication of anesthesia     trouble waking up  . High cholesterol   . Migraine   . Common migraine with intractable migraine 05/11/2016    Past Surgical History  Procedure Laterality Date  . Laparoscopic appendectomy  2009    [name]  . Abdominal hysterectomy  2010    [name]  . Temporomandibular joint arthroplasty    . Tonsillectomy    . Colonoscopy w/ biopsies  2011    Dr [name]  . Esophagogastroduodenoscopy  2011    Dr [name]  . Cholecystectomy N/A 03/10/2014    Procedure: LAPAROSCOPIC CHOLECYSTECTOMY WITH INTRAOPERATIVE CHOLANGIOGRAM;  Surgeon: [name], MD;  Location: [facility];  Service: General;  Laterality: N/A;    Family History  Problem Relation Age of Onset  . Hyperlipidemia    . Arthritis    . Sudden death    . Heart disease Father   . Hypertension Father   . Hemochromatosis Father   . Diabetes Mother   . Alcohol abuse Maternal Grandfather   . Diabetes Brother     Social history:  reports that she has been smoking Cigarettes.  She has a 48 pack-year smoking history. She has never used smokeless tobacco. She reports that she drinks alcohol. She reports that she does not use illicit drugs.  Medications:  Prior to Admission medications   Medication Sig Start Date End Date Taking? Authorizing Provider  butalbital-acetaminophen-caffeine (FIORICET) 50-325-40 MG tablet Take 1 tablet by mouth every 6 (six) hours as needed for headache. 05/04/16   Elmyra Ricks [place], PA-C  doxycycline (MONODOX) 100 MG capsule Take 100 mg by mouth 2 (two) times daily. Started 06/16 for 10 days 04/23/16   Historical Provider, MD  naproxen sodium (ANAPROX) 220 MG tablet Take 440 mg by mouth every 12 (twelve) hours as needed (for pain).    Historical Provider, MD      Allergies  Allergen Reactions  . Amoxicillin-Pot Clavulanate Hives, Rash, Other  (See Comments) and Hypertension    Extreme headache Has patient had a PCN reaction causing immediate rash, facial/tongue/throat swelling, SOB or lightheadedness with hypotension: Yes Has patient had a PCN reaction causing severe rash involving mucus membranes or skin necrosis: Yes  Has patient had a PCN reaction that required hospitalization No Has patient had a PCN reaction occurring within the last 10 years: Yes - 2010 If all of the above answers are "NO", then may proceed with Cephalosporin use.   . Bee Venom Swelling    Swelling at the site   . Shellfish Allergy Swelling    Throat & mouth swell. Her blood pressure goes up.    ROS:  Out of a complete 14 system review of symptoms, the patient complains only of the following symptoms, and all other reviewed systems are negative.  Fatigue Eye pain Achy muscles Allergies Headache  Blood pressure 102/66, pulse 83, height 5' 5"  (1.651 m), weight 173 lb (78.472 kg).  Physical Exam  General: The patient is alert and cooperative at the time of the examination.  Eyes: Pupils are equal, round, and reactive to light. Discs are flat bilaterally.  Neck: The neck is supple, no carotid bruits are noted.  Respiratory: The respiratory examination is clear.  Cardiovascular: The cardiovascular examination reveals a regular rate and rhythm, no obvious murmurs or rubs are noted.  Neuromuscular: Range of movement the cervical spine is full.  Skin: Extremities are without significant edema.  Neurologic Exam  Mental status: The patient is alert and oriented x 3 at the time of the examination. The patient has apparent normal recent and remote memory, with an apparently normal attention span and concentration ability.  Cranial nerves: Facial symmetry is present. There is good sensation of the face to pinprick and soft touch bilaterally. The strength of the facial muscles and the muscles to head turning and shoulder shrug are normal bilaterally.  Speech is well enunciated, no aphasia or dysarthria is noted. Extraocular movements are full. Visual fields are full. The tongue is midline, and the patient has symmetric elevation of the soft palate. No obvious hearing deficits are noted.  Motor: The motor testing reveals 5 over 5 strength of all 4 extremities. Good symmetric motor tone is noted throughout.  Sensory: Sensory testing is intact to pinprick, soft touch, vibration sensation, and position sense on all 4 extremities, with exception of some decrease in pinprick sensation on the right leg relative to the left. No evidence of extinction is noted.  Coordination: Cerebellar testing reveals good finger-nose-finger and heel-to-shin bilaterally.  Gait and station: Gait is normal. Tandem gait is normal. Romberg is negative. No drift is seen.  Reflexes: Deep tendon reflexes are symmetric and normal bilaterally. Toes are downgoing bilaterally.   CT head 05/04/16:  IMPRESSION: No acute abnormalities. Slight diffuse atrophy.  * CT scan images were reviewed online. I  agree with the written report.    Assessment/Plan:  1. Migraine headache  The patient appears to have common migraine. The patient has had converted headache over the last 2 weeks. She will be placed on a prednisone Dosepak 5 mg 6 day pack. She will be given a prescription for Maxalt, she was cautioned about not overusing the Fioricet as this may result in rebound headache. She will be placed on Topamax. She will follow-up in 2-3 months. If the headaches ever return to her usual baseline, the Topamax may be tapered and discontinued. She will call our office if any problems arise.  Jill Alexanders MD 05/11/2016 6:26 PM  [facility] [address] [place], [place] [address]  Phone [phone] Fax [phone]

## 2016-05-11 NOTE — Patient Instructions (Signed)

## 2016-08-11 ENCOUNTER — Ambulatory Visit (INDEPENDENT_AMBULATORY_CARE_PROVIDER_SITE_OTHER): Payer: BLUE CROSS/BLUE SHIELD | Admitting: Adult Health

## 2016-08-11 ENCOUNTER — Encounter: Payer: Self-pay | Admitting: Adult Health

## 2016-08-11 VITALS — BP 115/70 | HR 90

## 2016-08-11 DIAGNOSIS — G43019 Migraine without aura, intractable, without status migrainosus: Secondary | ICD-10-CM

## 2016-08-11 MED ORDER — PREDNISONE 5 MG PO TABS: ORAL_TABLET | ORAL | 0 refills | Status: DC

## 2016-08-11 MED ORDER — TOPIRAMATE 25 MG PO TABS: 75.0000 mg | ORAL_TABLET | Freq: Every day | ORAL | 11 refills | Status: DC

## 2016-08-11 NOTE — Patient Instructions (Signed)
Continue Topamax 75 mg at bedtime Using Maxalt when you develop a headache and can repeat if headache does not resolve after 2 hours.  Prednisone Dosepak for headache If your symptoms worsen or you develop new symptoms please let us know.

## 2016-08-11 NOTE — Progress Notes (Signed)
I have read the note, and I agree with the clinical assessment and plan.  WILLIS,CHARLES KEITH   

## 2016-08-11 NOTE — Progress Notes (Signed)
PATIENT: [name] DOB: [birth year]  REASON FOR VISIT: follow up- migraine headaches HISTORY FROM: patient  HISTORY OF PRESENT ILLNESS: [name] is a 55 year old female with a history of headaches. She returns today for follow-up. At the last visit she was started on Topamax working up to 75 mg at bedtime. She was also given a prescription for Maxalt. She reports that her headaches have improved. She states that since her visit her headaches resolved after the prednisone Dosepak. She  Essentially has not had any headaches until this past Sunday. She states that she did not use her prescription strength glasses but instead used store ball glasses. She feels that this triggered her migraine. She has had a headache since Sunday. She has tried Maxalt with no relief. However she does report that initially she took Maxalt daily for the acute headache she experienced- this was after her visit in July.. She states her headaches are currently located behind the eyes. She does report photophobia. She reports with severe headache she does have nausea and vomiting. Occasionally will have visual changes such as double vision during the headache. She returns today for an evaluation.  HISTORY 05/11/16: [name] is a 55 year old right-handed white female with a history of headaches dating back about 2 years. The patient does not recall having many headaches growing up. In general the headaches have been occurring 3 or 4 times a month, she will note that the headaches usually begin in the back of the head with a throbbing sensation, and then she gets sharp jabbing pains behind eyes bilaterally. She may have nausea and vomiting with the headache, she may have spots in front of the eyes, and photophobia with the headaches but no phonophobia. She indicates that sleep will help the headache, and if she vomits this will also reduce the headache. The patient may take Aleve or Motrin with some benefit. She has recently  been given Fioricet which also helps the headaches. Over the last 2 weeks the headaches have converted to being daily in nature. She was seen in the emergency room on 05/04/2016, a CT scan of the head was done and was unremarkable. She has noted that certain odors may bring on headaches, and possibly weather changes. The patient may have some dizziness with the headache and she reports some neck stiffness. She does not know of any family history of migraine. She comes to this office for an evaluation.  REVIEW OF SYSTEMS: Out of a complete 14 system review of symptoms, the patient complains only of the following symptoms, and all other reviewed systems are negative.  See history of present illness  ALLERGIES: Allergies  Allergen Reactions  . Amoxicillin-Pot Clavulanate Hives, Rash, Other (See Comments) and Hypertension    Extreme headache Has patient had a PCN reaction causing immediate rash, facial/tongue/throat swelling, SOB or lightheadedness with hypotension: Yes Has patient had a PCN reaction causing severe rash involving mucus membranes or skin necrosis: Yes  Has patient had a PCN reaction that required hospitalization No Has patient had a PCN reaction occurring within the last 10 years: Yes - 2010 If all of the above answers are "NO", then may proceed with Cephalosporin use.   . Bee Venom Swelling    Swelling at the site   . Shellfish Allergy Swelling    Throat & mouth swell. Her blood pressure goes up.    HOME MEDICATIONS: Outpatient Medications Prior to Visit  Medication Sig Dispense Refill  . butalbital-acetaminophen-caffeine (FIORICET) 50-325-40 MG  tablet Take 1 tablet by mouth every 6 (six) hours as needed for headache. 20 tablet 0  . naproxen sodium (ANAPROX) 220 MG tablet Take 440 mg by mouth every 12 (twelve) hours as needed (for pain).    . ondansetron (ZOFRAN) 8 MG tablet     . predniSONE (DELTASONE) 5 MG tablet Begin taking 6 tablets daily, taper by one tablet daily  until off the medication. 21 tablet 0  . rizatriptan (MAXALT-MLT) 10 MG disintegrating tablet Take 1 tablet (10 mg total) by mouth 3 (three) times daily as needed for migraine. 9 tablet 3  . topiramate (TOPAMAX) 25 MG tablet Take one tablet at night for one week, then take 2 tablets at night for one week, then take 3 tablets at night. 90 tablet 3   No facility-administered medications prior to visit.     PAST MEDICAL HISTORY: Past Medical History:  Diagnosis Date  . Acute appendicitis with rupture 2009  . Chronic headaches   . Common migraine with intractable migraine 05/11/2016  . Complication of anesthesia    trouble waking up  . Depression   . Gallstones   . High cholesterol   . Migraine   . NASH (nonalcoholic steatohepatitis) 03/21/2010   Qualifier: Diagnosis of  By: [name] MD, [name]   . OSA (obstructive sleep apnea) 10/14/2012   Sleep study completed on 09/22/2012-interpreting physician Clinton D. Young MD  Impression: 1. Mild ulcerative sleep apnea/Cytoxan syndrome, AHI 7.1 per hour. 2. Moderate scarring with oxygen saturations were -87% with mean saturation to the study night at 92% on room air. 3. Regular cardiac rhythm and the heart rate is 60 beats per minute.   . Personal history of colonic polyps 03/09/2014   tubular adenoma 2011     PAST SURGICAL HISTORY: Past Surgical History:  Procedure Laterality Date  . ABDOMINAL HYSTERECTOMY  2010   [name]  . CHOLECYSTECTOMY N/A 03/10/2014   Procedure: LAPAROSCOPIC CHOLECYSTECTOMY WITH INTRAOPERATIVE CHOLANGIOGRAM;  Surgeon: [name], MD;  Location: [facility];  Service: General;  Laterality: N/A;  . COLONOSCOPY W/ BIOPSIES  2011   Dr [name]  . ESOPHAGOGASTRODUODENOSCOPY  2011   Dr [name]  . LAPAROSCOPIC APPENDECTOMY  2009   [name]  . TEMPOROMANDIBULAR JOINT ARTHROPLASTY    . TONSILLECTOMY      FAMILY HISTORY: Family History  Problem Relation Age of Onset  . Hyperlipidemia    . Arthritis    . Sudden death      . Heart disease Father   . Hypertension Father   . Hemochromatosis Father   . Diabetes Mother   . Alcohol abuse Maternal Grandfather   . Diabetes Brother     SOCIAL HISTORY: Social History   Social History  . Marital status: Married    Spouse name: N/A  . Number of children: 0  . Years of education: College   Occupational History  . Director of compensation and benefits     [employer]   Social History Main Topics  . Smoking status: Current Every Day Smoker    Packs/day: 1.50    Years: 32.00    Types: Cigarettes  . Smokeless tobacco: Never Used  . Alcohol use 0.0 oz/week     Comment: occassional  . Drug use: No  . Sexual activity: Yes    Birth control/ protection: Surgical   Other Topics Concern  . Not on file   Social History Narrative   She is originally from [place],  but grew up in [place].   She works as Information systems manager    She has been married 12 years.   No children      PHYSICAL EXAM  Vitals:   08/11/16 1533  BP: 115/70  Pulse: 90   There is no height or weight on file to calculate BMI.  Generalized: Well developed, in no acute distress   Neurological examination  Mentation: Alert oriented to time, place, history taking. Follows all commands speech and language fluent Cranial nerve II-XII: Pupils were equal round reactive to light. Extraocular movements were full, visual field were full on confrontational test. Facial sensation and strength were normal. Uvula tongue midline. Head turning and shoulder shrug  were normal and symmetric. Motor: The motor testing reveals 5 over 5 strength of all 4 extremities. Good symmetric motor tone is noted throughout.  Sensory: Sensory testing is intact to soft touch on all 4 extremities. No evidence of extinction is noted.  Coordination: Cerebellar testing reveals good finger-nose-finger and heel-to-shin bilaterally.  Gait and station: Gait is normal. Tandem gait is normal.  Romberg is negative. No drift is seen.  Reflexes: Deep tendon reflexes are symmetric and normal bilaterally.   DIAGNOSTIC DATA (LABS, IMAGING, TESTING) - I reviewed patient records, labs, notes, testing and imaging myself where available.  Lab Results  Component Value Date   WBC 9.7 05/04/2016   HGB 13.6 05/04/2016   HCT 38.9 05/04/2016   MCV 91.5 05/04/2016   PLT 236 05/04/2016      Component Value Date/Time   NA 137 05/04/2016 1437   K 3.8 05/04/2016 1437   CL 106 05/04/2016 1437   CO2 24 05/04/2016 1437   GLUCOSE 90 05/04/2016 1437   BUN 15 05/04/2016 1437   CREATININE 0.91 05/04/2016 1437   CREATININE 0.98 10/19/2014 1625   CALCIUM 8.7 (L) 05/04/2016 1437   PROT 7.0 05/04/2016 1437   ALBUMIN 4.1 05/04/2016 1437   AST 29 05/04/2016 1437   ALT 38 05/04/2016 1437   ALKPHOS 55 05/04/2016 1437   BILITOT 0.9 05/04/2016 1437   GFRNONAA >60 05/04/2016 1437   GFRAA >60 05/04/2016 1437   Lab Results  Component Value Date   CHOL 247 (H) 10/19/2014   HDL 46 10/19/2014   LDLCALC 141 (H) 10/19/2014   LDLDIRECT 218.6 08/31/2013   TRIG 300 (H) 10/19/2014   CHOLHDL 5.4 10/19/2014       ASSESSMENT AND PLAN 55 y.o. year old female  has a past medical history of Acute appendicitis with rupture (2009); Chronic headaches; Common migraine with intractable migraine (05/11/2016); Complication of anesthesia; Depression; Gallstones; High cholesterol; Migraine; NASH (nonalcoholic steatohepatitis) (03/21/2010); OSA (obstructive sleep apnea) (10/14/2012); and Personal history of colonic polyps (03/09/2014). here with:  1. Migraine headaches  The patient has currently had a headache since Sunday. I have offered to do an infusion however she has declined. I will order a prednisone Dosepak.Marland Kitchen She will continue on Topamax 75 mg at bedtime. Continue using Maxalt as needed for headaches. Advised that she should take Maxalt at the onset of her migraine and repeat in 2 hours if the headache has not  resolved. If her symptoms worsen or she develops any new symptoms  she should let us know. Will follow-up in 3 months or sooner if needed.     Ward Givens, MSN, NP-C 08/11/2016, 2:33 PM [facility] [address] [place], [place] [address] [phone]

## 2016-08-21 ENCOUNTER — Telehealth: Payer: Self-pay | Admitting: Adult Health

## 2016-08-21 NOTE — Telephone Encounter (Signed)
Pt called in stating her migraines are getting worse. She says finished steroid last week. Please call and advise [phone]

## 2016-08-21 NOTE — Telephone Encounter (Signed)
I called patient. No answer left VM

## 2016-08-24 DIAGNOSIS — G43009 Migraine without aura, not intractable, without status migrainosus: Secondary | ICD-10-CM | POA: Diagnosis not present

## 2016-08-24 MED ORDER — TOPIRAMATE 25 MG PO TABS: 100.0000 mg | ORAL_TABLET | Freq: Every day | ORAL | 11 refills | Status: DC

## 2016-08-24 NOTE — Telephone Encounter (Signed)
I called the patient. She did the steroid Dosepak but is now continuing to have headaches. She states her headaches are essentially daily. They are located behind the eyes. And radiates to the nape of the neck. She does have photophobia and nausea. States that she has been putting ice on the back of her head for any benefit. She states the pain is so bad at times that she is crying. She is on Topamax 75 mg at bedtime. Advised patient that we could increase Topamax to 100 mg. She can come in today for an infusion of Decadron [phone] mg IV. she verbalized understanding. She states that she will have to check with her boss to see if she can come. She will call and let us know.

## 2016-08-24 NOTE — Telephone Encounter (Signed)
The patient returned [name]'s call. Please call

## 2016-08-24 NOTE — Addendum Note (Signed)
Addended by: Trudie Buckler on: 08/24/2016 11:13 AM   Modules accepted: Orders

## 2016-08-25 ENCOUNTER — Telehealth: Payer: Self-pay | Admitting: *Deleted

## 2016-08-25 NOTE — Telephone Encounter (Signed)
LVM for pt to call office back to ensure she received message. Calling to check to see how patient is feeling. Relayed per MM,NP that [place] (intrafusion nurse) yesterday reported to MM.NP that pt reported she was not taking topamax daily, that she was missing some doses. Advised that MM, NP wanted to ensure she was not missing any daily doses. She should be taking 140m (4 tablets) at bedtime.

## 2016-08-27 DIAGNOSIS — F17200 Nicotine dependence, unspecified, uncomplicated: Secondary | ICD-10-CM | POA: Diagnosis not present

## 2016-09-07 ENCOUNTER — Other Ambulatory Visit: Payer: Self-pay | Admitting: Neurology

## 2016-10-26 DIAGNOSIS — M62552 Muscle wasting and atrophy, not elsewhere classified, left thigh: Secondary | ICD-10-CM | POA: Diagnosis not present

## 2016-11-03 DIAGNOSIS — M2242 Chondromalacia patellae, left knee: Secondary | ICD-10-CM | POA: Diagnosis not present

## 2016-11-17 DIAGNOSIS — M2242 Chondromalacia patellae, left knee: Secondary | ICD-10-CM | POA: Diagnosis not present

## 2016-11-19 DIAGNOSIS — M2242 Chondromalacia patellae, left knee: Secondary | ICD-10-CM | POA: Diagnosis not present

## 2016-11-24 ENCOUNTER — Ambulatory Visit: Payer: BLUE CROSS/BLUE SHIELD | Admitting: Adult Health

## 2016-11-24 DIAGNOSIS — M2242 Chondromalacia patellae, left knee: Secondary | ICD-10-CM | POA: Diagnosis not present

## 2016-11-27 DIAGNOSIS — M2242 Chondromalacia patellae, left knee: Secondary | ICD-10-CM | POA: Diagnosis not present

## 2016-12-01 ENCOUNTER — Ambulatory Visit (INDEPENDENT_AMBULATORY_CARE_PROVIDER_SITE_OTHER): Payer: BLUE CROSS/BLUE SHIELD | Admitting: Family Medicine

## 2016-12-01 ENCOUNTER — Encounter: Payer: Self-pay | Admitting: Family Medicine

## 2016-12-01 VITALS — BP 120/74 | HR 99 | Resp 12 | Ht 65.0 in | Wt 182.4 lb

## 2016-12-01 DIAGNOSIS — K7581 Nonalcoholic steatohepatitis (NASH): Secondary | ICD-10-CM | POA: Diagnosis not present

## 2016-12-01 DIAGNOSIS — R252 Cramp and spasm: Secondary | ICD-10-CM | POA: Diagnosis not present

## 2016-12-01 DIAGNOSIS — E785 Hyperlipidemia, unspecified: Secondary | ICD-10-CM | POA: Diagnosis not present

## 2016-12-01 DIAGNOSIS — G8929 Other chronic pain: Secondary | ICD-10-CM

## 2016-12-01 DIAGNOSIS — M5442 Lumbago with sciatica, left side: Secondary | ICD-10-CM

## 2016-12-01 NOTE — Progress Notes (Signed)
Pre visit review using our clinic review tool, if applicable. No additional management support is needed unless otherwise documented below in the visit note. 

## 2016-12-01 NOTE — Patient Instructions (Signed)
A few things to remember from today's visit:   Hyperlipidemia, unspecified hyperlipidemia type - Plan: Lipid panel, Hepatic function panel  Muscle cramping - Plan: Hepatic function panel, Basic metabolic panel, TSH   Please be sure medication list is accurate. If a new problem present, please set up appointment sooner than planned today.

## 2016-12-01 NOTE — Progress Notes (Signed)
HPI:   [name] is a 56 y.o. female, who is here today to establish care with me.  Former PCP: Dr [name] Last preventive routine visit: 2015.  Concerns today: Muscle cramps.   -Muscle cramps, intermittently for years but she feels like cramps are getting worse. Cramps are moderate to severe. Usually on hands, abdomen, calves, and feet. Calves cramps usually at night while she is in bed. L She has not noted exacerbating factors, alleviated by certain position.  Abdominal wall cramps are on lower abdomen ad along scar left after hysterectomy, exacerbated by prolonged sitting and alleviated by lying flat on her back.  No facial muscle disorder or focal weakness.  She denies any OTC herb or wt loss supplement. She stopped smoking about 4 moths ago and on Nicotine patch. She has tried to increase water intake as well as bananas fruit in case she has low potassium.  Denies FHx of musculoskeletal congenital disease or FHx of cramp disorder.   -Hx of lower back pain with radiation to LLE. According to patient, epidural injections were recommended but she is not interested in doing so. She has followed with "spine" specialist, last seen about 2 years ago. She denies saddle anesthesia, urine, or bowel incontinence.   Back pain is exacerbated by certain activities and relieved by rest.  Currently she is following with ortho for left knee pain, doing PT for left leg strength. According to pt, LLE muscle atrophy due to back pain.    -Hyperlipidemia:  Currently on non pharmacologic treatment. Following a low fat diet: Not consistently.  She has tried stopping medications in the past but made cramps worse.   Lab Results  Component Value Date   CHOL 247 (H) 10/19/2014   HDL 46 10/19/2014   LDLCALC 141 (H) 10/19/2014   LDLDIRECT 218.6 08/31/2013   TRIG 300 (H) 10/19/2014   CHOLHDL 5.4 10/19/2014    -Hx of fatty liver and IBS. She follows with Dr [name],  neurologists, for migraine headache, stopped taking Topamax. She is on Maxalt as need.  Hx of OSA, according to patient, it was mild and CPAP was not recommended.   Review of Systems  Constitutional: Positive for fatigue (no more than usual). Negative for activity change, appetite change, fever and unexpected weight change.  HENT: Negative for mouth sores, nosebleeds, sore throat, trouble swallowing and voice change.   Eyes: Negative for pain and visual disturbance.  Respiratory: Negative for cough, shortness of breath and wheezing.   Cardiovascular: Negative for chest pain, palpitations and leg swelling.  Gastrointestinal: Negative for blood in stool, nausea and vomiting.       Negative for changes in bowel habits.  Endocrine: Negative for cold intolerance, heat intolerance, polydipsia, polyphagia and polyuria.  Genitourinary: Negative for decreased urine volume, difficulty urinating and hematuria.  Musculoskeletal: Positive for arthralgias, back pain and myalgias. Negative for neck pain.  Skin: Negative for rash.  Neurological: Negative for tremors, syncope, weakness and headaches.  Hematological: Negative for adenopathy. Does not bruise/bleed easily.  Psychiatric/Behavioral: Negative for confusion. The patient is not nervous/anxious.       Current Outpatient Prescriptions on File Prior to Visit  Medication Sig Dispense Refill  . rizatriptan (MAXALT-MLT) 10 MG disintegrating tablet Take 1 tablet (10 mg total) by mouth 3 (three) times daily as needed for migraine. (Patient not taking: Reported on 08/11/2016) 9 tablet 3   No current facility-administered medications on file prior to visit.  Past Medical History:  Diagnosis Date  . Acute appendicitis with rupture 2009  . Chronic headaches   . Common migraine with intractable migraine 05/11/2016  . Complication of anesthesia    trouble waking up  . Depression   . Gallstones   . High cholesterol   . Migraine   . NASH  (nonalcoholic steatohepatitis) 03/21/2010   Qualifier: Diagnosis of  By: Monique Hughs MD, [name]   . OSA (obstructive sleep apnea) 10/14/2012   Sleep study completed on 09/22/2012-interpreting physician Monique D. Young MD  Impression: 1. Mild ulcerative sleep apnea/Cytoxan syndrome, AHI 7.1 per hour. 2. Moderate scarring with oxygen saturations were -87% with mean saturation to the study night at 92% on room air. 3. Regular cardiac rhythm and the heart rate is 60 beats per minute.   . Personal history of colonic polyps 03/09/2014   tubular adenoma 2011    Allergies  Allergen Reactions  . Amoxicillin-Pot Clavulanate Hives, Rash, Other (See Comments) and Hypertension    Extreme headache Has patient had a PCN reaction causing immediate rash, facial/tongue/throat swelling, SOB or lightheadedness with hypotension: Yes Has patient had a PCN reaction causing severe rash involving mucus membranes or skin necrosis: Yes  Has patient had a PCN reaction that required hospitalization No Has patient had a PCN reaction occurring within the last 10 years: Yes - 2010 If all of the above answers are "NO", then may proceed with Cephalosporin use.   . Bee Venom Swelling    Swelling at the site   . Shellfish Allergy Swelling    joints    Family History  Problem Relation Age of Onset  . Heart disease Father   . Hypertension Father   . Hemochromatosis Father   . Diabetes Mother   . Diabetes Brother   . Hyperlipidemia    . Arthritis    . Sudden death    . Alcohol abuse Maternal Grandfather     Social History   Social History  . Marital status: Married    Spouse name: N/A  . Number of children: 0  . Years of education: College   Occupational History  . Director of compensation and benefits     [employer]   Social History Main Topics  . Smoking status: Former Smoker    Packs/day: 1.50    Years: 32.00    Types: Cigarettes    Quit date: 08/01/2016  . Smokeless tobacco: Never Used  .  Alcohol use 0.0 oz/week     Comment: occassional  . Drug use: No  . Sexual activity: Yes    Birth control/ protection: Surgical   Other Topics Concern  . None   Social History Narrative   She is originally from [place], but grew up in [place].   She works as Information systems manager    She has been married 12 years.   No children    Vitals:   12/01/16 1626  BP: 120/74  Pulse: 99  Resp: 12   O2 sat 97% at RA.  Body mass index is 30.35 kg/m.   Physical Exam  Nursing note and vitals reviewed. Constitutional: She is oriented to person, place, and time. She appears well-developed. No distress.  HENT:  Head: Atraumatic.  Mouth/Throat: Oropharynx is clear and moist and mucous membranes are normal.  Eyes: Conjunctivae and EOM are normal. Pupils are equal, round, and reactive to light.  Neck: No tracheal deviation present. No thyroid mass and no thyromegaly present.  Cardiovascular: Normal  rate and regular rhythm.   No murmur heard. Pulses:      Dorsalis pedis pulses are 2+ on the right side, and 2+ on the left side.  Respiratory: Effort normal and breath sounds normal. No respiratory distress.  GI: Soft. She exhibits no mass. There is no hepatomegaly. There is no tenderness.  Musculoskeletal: She exhibits no edema.       Right shoulder: She exhibits tenderness.       Cervical back: She exhibits no tenderness.       Thoracic back: She exhibits no tenderness.       Lumbar back: She exhibits no tenderness.  Neurological: She is alert and oriented to person, place, and time. She has normal strength. Coordination and gait normal.  SLR negative bilateral.  Skin: Skin is warm. No rash noted. No erythema.  Psychiatric: She has a normal mood and affect.  Well groomed, good eye contact.      ASSESSMENT AND Cannon:     [name] was seen today for establish care.  Diagnoses and all orders for this visit:  Muscle cramping  Chronic. We discussed possible  etiologies and treatment options. Gabapentin may help, she is not interested in any medication for now.  Further recommendations would be given according to lab results.  -     Hepatic function panel; Future -     Basic metabolic panel; Future -     TSH; Future  Hyperlipidemia, unspecified hyperlipidemia type  Recommend consistency with low-fat diet.   She has not tolerated statin treatment in the past and she is not interested in trying pharmacologic treatment. Still she would like to have lipid panel done. Follow-up in a year.  -     Lipid panel; Future -     Hepatic function panel; Future -     TSH; Future  NASH (nonalcoholic steatohepatitis)  Low fat diet and weight loss recommended.  Further recommendations would be given according to lab results.  -     Hepatic function panel; Future  Chronic low back pain with left-sided sciatica, unspecified back pain laterality  For now she is not interested in following with orthopedists but would like to be referred upon request if at any point she feels like back pain is getting worse. Cymbalta may be a good options for her but she is not interested in medications.      Hovanes Hymas G. [place], MD  [facility]. [place] office.

## 2016-12-03 DIAGNOSIS — M2242 Chondromalacia patellae, left knee: Secondary | ICD-10-CM | POA: Diagnosis not present

## 2016-12-07 DIAGNOSIS — M25562 Pain in left knee: Secondary | ICD-10-CM | POA: Diagnosis not present

## 2016-12-07 DIAGNOSIS — M62552 Muscle wasting and atrophy, not elsewhere classified, left thigh: Secondary | ICD-10-CM | POA: Diagnosis not present

## 2016-12-09 DIAGNOSIS — R600 Localized edema: Secondary | ICD-10-CM | POA: Diagnosis not present

## 2016-12-10 DIAGNOSIS — M791 Myalgia: Secondary | ICD-10-CM | POA: Diagnosis not present

## 2016-12-10 DIAGNOSIS — M545 Low back pain: Secondary | ICD-10-CM | POA: Diagnosis not present

## 2016-12-10 DIAGNOSIS — M5432 Sciatica, left side: Secondary | ICD-10-CM | POA: Diagnosis not present

## 2016-12-10 DIAGNOSIS — M5116 Intervertebral disc disorders with radiculopathy, lumbar region: Secondary | ICD-10-CM | POA: Diagnosis not present

## 2016-12-14 DIAGNOSIS — M23332 Other meniscus derangements, other medial meniscus, left knee: Secondary | ICD-10-CM | POA: Diagnosis not present

## 2016-12-14 DIAGNOSIS — M1712 Unilateral primary osteoarthritis, left knee: Secondary | ICD-10-CM | POA: Diagnosis not present

## 2016-12-15 ENCOUNTER — Other Ambulatory Visit: Payer: BLUE CROSS/BLUE SHIELD

## 2016-12-16 ENCOUNTER — Other Ambulatory Visit (INDEPENDENT_AMBULATORY_CARE_PROVIDER_SITE_OTHER): Payer: BLUE CROSS/BLUE SHIELD

## 2016-12-16 DIAGNOSIS — R252 Cramp and spasm: Secondary | ICD-10-CM

## 2016-12-16 DIAGNOSIS — K7581 Nonalcoholic steatohepatitis (NASH): Secondary | ICD-10-CM | POA: Diagnosis not present

## 2016-12-16 DIAGNOSIS — E785 Hyperlipidemia, unspecified: Secondary | ICD-10-CM | POA: Diagnosis not present

## 2016-12-16 LAB — BASIC METABOLIC PANEL
BUN: 16 mg/dL (ref 6–23)
CO2: 28 mEq/L (ref 19–32)
Calcium: 9.6 mg/dL (ref 8.4–10.5)
Chloride: 103 mEq/L (ref 96–112)
Creatinine, Ser: 0.98 mg/dL (ref 0.40–1.20)
GFR: 62.2 mL/min (ref >60.00)
Glucose, Bld: 130 mg/dL — ABNORMAL HIGH (ref 70–99)
Potassium: 4.8 mEq/L (ref 3.5–5.1)
Sodium: 138 mEq/L (ref 135–145)

## 2016-12-16 LAB — HEPATIC FUNCTION PANEL
ALT: 69 U/L — ABNORMAL HIGH (ref 0–35)
AST: 44 U/L — ABNORMAL HIGH (ref 0–37)
Albumin: 4.3 g/dL (ref 3.5–5.2)
Alkaline Phosphatase: 58 U/L (ref 39–117)
Bilirubin, Direct: 0.1 mg/dL (ref 0.0–0.3)
Total Bilirubin: 0.5 mg/dL (ref 0.2–1.2)
Total Protein: 7.2 g/dL (ref 6.0–8.3)

## 2016-12-16 LAB — LIPID PANEL
Cholesterol: 280 mg/dL — ABNORMAL HIGH (ref 0–200)
HDL: 48.7 mg/dL (ref >39.00)
NonHDL: 231.01
Total CHOL/HDL Ratio: 6
Triglycerides: 201 mg/dL — ABNORMAL HIGH (ref 0.0–149.0)
VLDL: 40.2 mg/dL — ABNORMAL HIGH (ref 0.0–40.0)

## 2016-12-16 LAB — LDL CHOLESTEROL, DIRECT: Direct LDL: 187 mg/dL

## 2016-12-16 LAB — TSH: TSH: 1.03 u[IU]/mL (ref 0.35–4.50)

## 2016-12-21 DIAGNOSIS — M545 Low back pain: Secondary | ICD-10-CM | POA: Diagnosis not present

## 2016-12-21 DIAGNOSIS — M5126 Other intervertebral disc displacement, lumbar region: Secondary | ICD-10-CM | POA: Diagnosis not present

## 2016-12-22 ENCOUNTER — Encounter: Payer: Self-pay | Admitting: Family Medicine

## 2016-12-23 ENCOUNTER — Ambulatory Visit (INDEPENDENT_AMBULATORY_CARE_PROVIDER_SITE_OTHER): Payer: BLUE CROSS/BLUE SHIELD | Admitting: Family Medicine

## 2016-12-23 ENCOUNTER — Other Ambulatory Visit (INDEPENDENT_AMBULATORY_CARE_PROVIDER_SITE_OTHER): Payer: BLUE CROSS/BLUE SHIELD | Admitting: Family Medicine

## 2016-12-23 ENCOUNTER — Other Ambulatory Visit: Payer: Self-pay

## 2016-12-23 VITALS — BP 110/70 | HR 89 | Ht 65.0 in | Wt 189.9 lb

## 2016-12-23 DIAGNOSIS — R309 Painful micturition, unspecified: Secondary | ICD-10-CM

## 2016-12-23 DIAGNOSIS — R8290 Unspecified abnormal findings in urine: Secondary | ICD-10-CM | POA: Diagnosis not present

## 2016-12-23 DIAGNOSIS — R300 Dysuria: Secondary | ICD-10-CM | POA: Diagnosis not present

## 2016-12-23 LAB — POC URINALSYSI DIPSTICK (AUTOMATED)
Bilirubin, UA: NEGATIVE
Blood, UA: NEGATIVE
Glucose, UA: NEGATIVE
Ketones, UA: NEGATIVE
Nitrite, UA: NEGATIVE
Protein, UA: NEGATIVE
Spec Grav, UA: 1.015
Urobilinogen, UA: 0.2
pH, UA: 5.5

## 2016-12-23 MED ORDER — NITROFURANTOIN MONOHYD MACRO 100 MG PO CAPS: 100.0000 mg | ORAL_CAPSULE | Freq: Two times a day (BID) | ORAL | 0 refills | Status: DC

## 2016-12-23 NOTE — Patient Instructions (Signed)

## 2016-12-23 NOTE — Progress Notes (Signed)
Subjective:     Patient ID: [name], female   DOB: May 23, 1961, 56 y.o.   MRN: [id number]  HPI Patient seen with 2 day history of some suprapubic pain and urine frequency. No burning with urination. She is concerned because she had complicated UTI couple years ago. She's not had any fevers or chills. No flank pain. No nausea or vomiting. Penicillin allergic. She's been taking lots of water and cranberry juice without resolution. She is concerned because she has arthroscopic left knee surgery scheduled in 2 weeks  Past Medical History:  Diagnosis Date  . Acute appendicitis with rupture 2009  . Chronic headaches   . Common migraine with intractable migraine 05/11/2016  . Complication of anesthesia    trouble waking up  . Depression   . Gallstones   . High cholesterol   . Migraine   . NASH (nonalcoholic steatohepatitis) 03/21/2010   Qualifier: Diagnosis of  By: [name] MD, [name]   . OSA (obstructive sleep apnea) 10/14/2012   Sleep study completed on 09/22/2012-interpreting physician Clinton D. Young MD  Impression: 1. Mild ulcerative sleep apnea/Cytoxan syndrome, AHI 7.1 per hour. 2. Moderate scarring with oxygen saturations were -87% with mean saturation to the study night at 92% on room air. 3. Regular cardiac rhythm and the heart rate is 60 beats per minute.   . Personal history of colonic polyps 03/09/2014   tubular adenoma 2011    Past Surgical History:  Procedure Laterality Date  . ABDOMINAL HYSTERECTOMY  2010   [name]  . CHOLECYSTECTOMY N/A 03/10/2014   Procedure: LAPAROSCOPIC CHOLECYSTECTOMY WITH INTRAOPERATIVE CHOLANGIOGRAM;  Surgeon: [name], MD;  Location: [facility];  Service: General;  Laterality: N/A;  . COLONOSCOPY W/ BIOPSIES  2011   Dr [name]  . ESOPHAGOGASTRODUODENOSCOPY  2011   Dr [name]  . LAPAROSCOPIC APPENDECTOMY  2009   [name]  . TEMPOROMANDIBULAR JOINT ARTHROPLASTY    . TEMPOROMANDIBULAR JOINT SURGERY    . TONSILLECTOMY      reports that she  quit smoking about 4 months ago. Her smoking use included Cigarettes. She has a 48.00 pack-year smoking history. She has never used smokeless tobacco. She reports that she drinks alcohol. She reports that she does not use drugs. family history includes Alcohol abuse in her maternal grandfather; Diabetes in her brother and mother; Heart disease in her father; Hemochromatosis in her father; Hypertension in her father. Allergies  Allergen Reactions  . Amoxicillin-Pot Clavulanate Hives, Rash, Other (See Comments) and Hypertension    Extreme headache Has patient had a PCN reaction causing immediate rash, facial/tongue/throat swelling, SOB or lightheadedness with hypotension: Yes Has patient had a PCN reaction causing severe rash involving mucus membranes or skin necrosis: Yes  Has patient had a PCN reaction that required hospitalization No Has patient had a PCN reaction occurring within the last 10 years: Yes - 2010 If all of the above answers are "NO", then may proceed with Cephalosporin use.   . Bee Venom Swelling    Swelling at the site   . Shellfish Allergy Swelling    joints     Review of Systems  Constitutional: Negative for appetite change, chills and fever.  Gastrointestinal: Negative for abdominal pain, constipation, diarrhea, nausea and vomiting.  Genitourinary: Positive for dysuria and frequency. Negative for hematuria.  Musculoskeletal: Negative for back pain.  Neurological: Negative for dizziness.       Objective:   Physical Exam  Constitutional: She appears well-developed and well-nourished.  Cardiovascular:  Normal rate and regular rhythm.   Pulmonary/Chest: Effort normal and breath sounds normal. No respiratory distress. She has no wheezes. She has no rales.       Assessment:     Dysuria and suprapubic pain. Rule out UTI. Urine dipstick small leukocytes otherwise negative    Plan:     -urine culture sent -Start Macrobid 1 twice a day for 5 days pending culture  results -Follow-up as needed  Eulas Post MD [facility]

## 2016-12-23 NOTE — Progress Notes (Signed)
Pre visit review using our clinic review tool, if applicable. No additional management support is needed unless otherwise documented below in the visit note. 

## 2016-12-24 DIAGNOSIS — M5116 Intervertebral disc disorders with radiculopathy, lumbar region: Secondary | ICD-10-CM | POA: Diagnosis not present

## 2016-12-24 DIAGNOSIS — M5136 Other intervertebral disc degeneration, lumbar region: Secondary | ICD-10-CM | POA: Diagnosis not present

## 2016-12-24 DIAGNOSIS — M545 Low back pain: Secondary | ICD-10-CM | POA: Diagnosis not present

## 2016-12-24 DIAGNOSIS — M4726 Other spondylosis with radiculopathy, lumbar region: Secondary | ICD-10-CM | POA: Diagnosis not present

## 2016-12-24 DIAGNOSIS — M461 Sacroiliitis, not elsewhere classified: Secondary | ICD-10-CM | POA: Diagnosis not present

## 2016-12-24 DIAGNOSIS — M791 Myalgia: Secondary | ICD-10-CM | POA: Diagnosis not present

## 2016-12-24 DIAGNOSIS — M5432 Sciatica, left side: Secondary | ICD-10-CM | POA: Diagnosis not present

## 2016-12-26 LAB — URINE CULTURE: Organism ID, Bacteria: NO GROWTH

## 2016-12-27 ENCOUNTER — Encounter: Payer: Self-pay | Admitting: Family Medicine

## 2017-01-04 DIAGNOSIS — S83232A Complex tear of medial meniscus, current injury, left knee, initial encounter: Secondary | ICD-10-CM | POA: Diagnosis not present

## 2017-01-04 DIAGNOSIS — S83222A Peripheral tear of medial meniscus, current injury, left knee, initial encounter: Secondary | ICD-10-CM | POA: Insufficient documentation

## 2017-01-04 DIAGNOSIS — M65862 Other synovitis and tenosynovitis, left lower leg: Secondary | ICD-10-CM | POA: Diagnosis not present

## 2017-01-04 DIAGNOSIS — S83272A Complex tear of lateral meniscus, current injury, left knee, initial encounter: Secondary | ICD-10-CM | POA: Diagnosis not present

## 2017-01-04 DIAGNOSIS — S83242A Other tear of medial meniscus, current injury, left knee, initial encounter: Secondary | ICD-10-CM | POA: Diagnosis not present

## 2017-01-04 DIAGNOSIS — G8918 Other acute postprocedural pain: Secondary | ICD-10-CM | POA: Diagnosis not present

## 2017-01-05 DIAGNOSIS — M461 Sacroiliitis, not elsewhere classified: Secondary | ICD-10-CM | POA: Diagnosis not present

## 2017-01-06 DIAGNOSIS — M6281 Muscle weakness (generalized): Secondary | ICD-10-CM | POA: Diagnosis not present

## 2017-01-06 DIAGNOSIS — M25662 Stiffness of left knee, not elsewhere classified: Secondary | ICD-10-CM | POA: Diagnosis not present

## 2017-01-06 DIAGNOSIS — M25562 Pain in left knee: Secondary | ICD-10-CM | POA: Diagnosis not present

## 2017-01-11 DIAGNOSIS — M6281 Muscle weakness (generalized): Secondary | ICD-10-CM | POA: Diagnosis not present

## 2017-01-11 DIAGNOSIS — M25562 Pain in left knee: Secondary | ICD-10-CM | POA: Diagnosis not present

## 2017-01-11 DIAGNOSIS — M25662 Stiffness of left knee, not elsewhere classified: Secondary | ICD-10-CM | POA: Diagnosis not present

## 2017-02-08 DIAGNOSIS — M545 Low back pain: Secondary | ICD-10-CM | POA: Diagnosis not present

## 2017-02-08 DIAGNOSIS — M461 Sacroiliitis, not elsewhere classified: Secondary | ICD-10-CM | POA: Diagnosis not present

## 2017-02-16 DIAGNOSIS — L814 Other melanin hyperpigmentation: Secondary | ICD-10-CM | POA: Diagnosis not present

## 2017-02-16 DIAGNOSIS — L821 Other seborrheic keratosis: Secondary | ICD-10-CM | POA: Diagnosis not present

## 2017-02-16 DIAGNOSIS — D225 Melanocytic nevi of trunk: Secondary | ICD-10-CM | POA: Diagnosis not present

## 2017-02-16 DIAGNOSIS — L820 Inflamed seborrheic keratosis: Secondary | ICD-10-CM | POA: Diagnosis not present

## 2017-03-16 DIAGNOSIS — L821 Other seborrheic keratosis: Secondary | ICD-10-CM | POA: Diagnosis not present

## 2017-03-16 DIAGNOSIS — L820 Inflamed seborrheic keratosis: Secondary | ICD-10-CM | POA: Diagnosis not present

## 2017-03-26 DIAGNOSIS — W57XXXA Bitten or stung by nonvenomous insect and other nonvenomous arthropods, initial encounter: Secondary | ICD-10-CM | POA: Diagnosis not present

## 2017-03-26 DIAGNOSIS — S20461A Insect bite (nonvenomous) of right back wall of thorax, initial encounter: Secondary | ICD-10-CM | POA: Diagnosis not present

## 2017-03-26 DIAGNOSIS — R5383 Other fatigue: Secondary | ICD-10-CM | POA: Diagnosis not present

## 2017-04-19 DIAGNOSIS — T732XXA Exhaustion due to exposure, initial encounter: Secondary | ICD-10-CM | POA: Diagnosis not present

## 2017-04-19 DIAGNOSIS — A770 Spotted fever due to Rickettsia rickettsii: Secondary | ICD-10-CM | POA: Diagnosis not present

## 2017-05-05 DIAGNOSIS — W57XXXA Bitten or stung by nonvenomous insect and other nonvenomous arthropods, initial encounter: Secondary | ICD-10-CM | POA: Diagnosis not present

## 2017-05-05 DIAGNOSIS — Z8619 Personal history of other infectious and parasitic diseases: Secondary | ICD-10-CM | POA: Diagnosis not present

## 2017-05-05 DIAGNOSIS — M461 Sacroiliitis, not elsewhere classified: Secondary | ICD-10-CM | POA: Diagnosis not present

## 2017-05-05 DIAGNOSIS — R5383 Other fatigue: Secondary | ICD-10-CM | POA: Diagnosis not present

## 2017-05-05 DIAGNOSIS — R5381 Other malaise: Secondary | ICD-10-CM | POA: Diagnosis not present

## 2017-05-05 DIAGNOSIS — M545 Low back pain: Secondary | ICD-10-CM | POA: Diagnosis not present

## 2017-06-04 DIAGNOSIS — M5136 Other intervertebral disc degeneration, lumbar region: Secondary | ICD-10-CM | POA: Diagnosis not present

## 2017-06-04 DIAGNOSIS — M545 Low back pain: Secondary | ICD-10-CM | POA: Diagnosis not present

## 2017-06-04 DIAGNOSIS — M5432 Sciatica, left side: Secondary | ICD-10-CM | POA: Diagnosis not present

## 2017-06-04 DIAGNOSIS — M461 Sacroiliitis, not elsewhere classified: Secondary | ICD-10-CM | POA: Diagnosis not present

## 2017-06-04 DIAGNOSIS — M791 Myalgia: Secondary | ICD-10-CM | POA: Diagnosis not present

## 2017-06-04 DIAGNOSIS — Z4689 Encounter for fitting and adjustment of other specified devices: Secondary | ICD-10-CM | POA: Diagnosis not present

## 2017-06-04 DIAGNOSIS — M4726 Other spondylosis with radiculopathy, lumbar region: Secondary | ICD-10-CM | POA: Diagnosis not present

## 2017-07-22 ENCOUNTER — Encounter: Payer: Self-pay | Admitting: Family Medicine

## 2017-07-22 ENCOUNTER — Ambulatory Visit (INDEPENDENT_AMBULATORY_CARE_PROVIDER_SITE_OTHER): Payer: BLUE CROSS/BLUE SHIELD | Admitting: Family Medicine

## 2017-07-22 VITALS — BP 92/70 | HR 72 | Temp 98.2°F | Ht 65.0 in | Wt 186.4 lb

## 2017-07-22 DIAGNOSIS — K625 Hemorrhage of anus and rectum: Secondary | ICD-10-CM

## 2017-07-22 MED ORDER — HYDROCORTISONE 2.5 % RE CREA: 1.0000 "application " | TOPICAL_CREAM | Freq: Two times a day (BID) | RECTAL | 0 refills | Status: DC

## 2017-07-22 NOTE — Progress Notes (Signed)
HPI:  [name] There is a pleasant 56 year old here for an acute visit for bright red blood per rectum: -Reports this 2 weeks ago when she had one episode of diarrhea - Saul small streak of blood on toilet paper and had some rectal discomfort -About 4 days ago she had a small streak of blood on the toilet paper with a bowel movement -She has had some mild discomfort in the rectal area with using the restroom and she saw a small speck of blood on the toilet paper yesterday -Denies change in bowels except for the one episode of diarrhea, constipation, fevers, malaise, large amounts of blood per rectum -History of colon polyps and sees [name] - reports she is past due for her colonoscopy -her last colonoscopy report in Epic says repeat in 3-5 years, but per GI letter it appears she was due in 5 years, that was in 2015  ROS: See pertinent positives and negatives per HPI.  Past Medical History:  Diagnosis Date  . Acute appendicitis with rupture 2009  . Chronic headaches   . Common migraine with intractable migraine 05/11/2016  . Complication of anesthesia    trouble waking up  . Depression   . Gallstones   . High cholesterol   . Migraine   . NASH (nonalcoholic steatohepatitis) 03/21/2010   Qualifier: Diagnosis of  By: [name] MD, [name]   . OSA (obstructive sleep apnea) 10/14/2012   Sleep study completed on 09/22/2012-interpreting physician Clinton D. Young MD  Impression: 1. Mild ulcerative sleep apnea/Cytoxan syndrome, AHI 7.1 per hour. 2. Moderate scarring with oxygen saturations were -87% with mean saturation to the study night at 92% on room air. 3. Regular cardiac rhythm and the heart rate is 60 beats per minute.   . Personal history of colonic polyps 03/09/2014   tubular adenoma 2011     Past Surgical History:  Procedure Laterality Date  . ABDOMINAL HYSTERECTOMY  2010   [name]  . CHOLECYSTECTOMY N/A 03/10/2014   Procedure: LAPAROSCOPIC CHOLECYSTECTOMY WITH  INTRAOPERATIVE CHOLANGIOGRAM;  Surgeon: [name], MD;  Location: [facility];  Service: General;  Laterality: N/A;  . COLONOSCOPY W/ BIOPSIES  2011   Dr [name]  . ESOPHAGOGASTRODUODENOSCOPY  2011   Dr [name]  . LAPAROSCOPIC APPENDECTOMY  2009   [name]  . TEMPOROMANDIBULAR JOINT ARTHROPLASTY    . TEMPOROMANDIBULAR JOINT SURGERY    . TONSILLECTOMY      Family History  Problem Relation Age of Onset  . Heart disease Father   . Hypertension Father   . Hemochromatosis Father   . Diabetes Mother   . Diabetes Brother   . Hyperlipidemia Unknown   . Arthritis Unknown   . Sudden death Unknown   . Alcohol abuse Maternal Grandfather     Social History   Social History  . Marital status: Married    Spouse name: N/A  . Number of children: 0  . Years of education: College   Occupational History  . Director of compensation and benefits     [employer]   Social History Main Topics  . Smoking status: Former Smoker    Packs/day: 1.50    Years: 32.00    Types: Cigarettes    Quit date: 08/01/2016  . Smokeless tobacco: Never Used  . Alcohol use 0.0 oz/week     Comment: occassional  . Drug use: No  . Sexual activity: Yes    Birth control/ protection: Surgical   Other  Topics Concern  . None   Social History Narrative   She is originally from [place], but grew up in [place].   She works as Information systems manager    She has been married 12 years.   No children     Current Outpatient Prescriptions:  .  cyclobenzaprine (FLEXERIL) 10 MG tablet, TK 1/2 TO 1 T PO TID, Disp: , Rfl:  .  rizatriptan (MAXALT-MLT) 10 MG disintegrating tablet, Take 1 tablet (10 mg total) by mouth 3 (three) times daily as needed for migraine., Disp: 9 tablet, Rfl: 3 .  hydrocortisone (ANUSOL-HC) 2.5 % rectal cream, Place 1 application rectally 2 (two) times daily., Disp: 30 g, Rfl: 0  EXAM:  Vitals:   07/22/17 1317  BP: 92/70  Pulse: 72  Temp: 98.2 F (36.8 C)     Body mass index is 31.02 kg/m.  GENERAL: vitals reviewed and listed above, alert, oriented, appears well hydrated and in no acute distress  HEENT: atraumatic, conjunttiva clear, no obvious abnormalities on inspection of external nose and ears  NECK: no obvious masses on inspection  LUNGS: clear to auscultation bilaterally, no wheezes, rales or rhonchi, good air movement  CV: HRRR, no peripheral edema  DRE: questionable small hemorrhoid, Hemoccult card mildly positive, no obvious skin tears on exam today  MS: moves all extremities without noticeable abnormality  PSYCH: pleasant and cooperative, no obvious depression or anxiety  ASSESSMENT AND PLAN:  Discussed the following assessment and plan:  BRBPR (bright red blood per rectum)  -we discussed possible serious and likely etiologies, workup and treatment, treatment risks and return precautions - hemorrhoid possible etiology -after this discussion, [name] opted for treatment with Anusol, sitz baths and advised follow-up with her gastroenterologist given her history - she agrees to contact GI office -follow up advised here as needed -of course, we advised Mindi  to return or notify a doctor immediately if symptoms worsen or persist or new concerns arise.   Patient Instructions  Please call your gastroenterologist, [name] for evaluation.  Anusol per instructions.  Call your gastroenterologist or seek care immediately if worsening or new concerns.  How to Take a Sitz Bath A sitz bath is a warm water bath that is taken while you are sitting down. The water should only come up to your hips and should cover your buttocks. Your health care provider may recommend a sitz bath to help you:  Clean the lower part of your body, including your genital area.  With itching.  With pain.  With sore muscles or muscles that tighten or spasm.  How to take a sitz bath Take 3-4 sitz baths per day or as told by your health care  provider. 1. Partially fill a bathtub with warm water. You will only need the water to be deep enough to cover your hips and buttocks when you are sitting in it. 2. If your health care provider told you to put medicine in the water, follow the directions exactly. 3. Sit in the water and open the tub drain a little. 4. Turn on the warm water again to keep the tub at the correct level. Keep the water running constantly. 5. Soak in the water for 15-20 minutes or as told by your health care provider. 6. After the sitz bath, pat the affected area dry first. Do not rub it. 7. Be careful when you stand up after the sitz bath because you may feel dizzy.  Contact a health care provider if:  Your symptoms get worse. Do not continue with sitz baths if your symptoms get worse.  You have new symptoms. Do not continue with sitz baths until you talk with your health care provider. This information is not intended to replace advice given to you by your health care provider. Make sure you discuss any questions you have with your health care provider. Document Released: 07/18/2004 Document Revised: 03/25/2016 Document Reviewed: 10/24/2014 Elsevier Interactive Patient Education  2018 [place]

## 2017-07-22 NOTE — Patient Instructions (Addendum)
Please call your gastroenterologist, [name] for evaluation.  Anusol per instructions.  Call your gastroenterologist or seek care immediately if worsening or new concerns.  How to Take a Sitz Bath A sitz bath is a warm water bath that is taken while you are sitting down. The water should only come up to your hips and should cover your buttocks. Your health care provider may recommend a sitz bath to help you:  Clean the lower part of your body, including your genital area.  With itching.  With pain.  With sore muscles or muscles that tighten or spasm.  How to take a sitz bath Take 3-4 sitz baths per day or as told by your health care provider. 1. Partially fill a bathtub with warm water. You will only need the water to be deep enough to cover your hips and buttocks when you are sitting in it. 2. If your health care provider told you to put medicine in the water, follow the directions exactly. 3. Sit in the water and open the tub drain a little. 4. Turn on the warm water again to keep the tub at the correct level. Keep the water running constantly. 5. Soak in the water for 15-20 minutes or as told by your health care provider. 6. After the sitz bath, pat the affected area dry first. Do not rub it. 7. Be careful when you stand up after the sitz bath because you may feel dizzy.  Contact a health care provider if:  Your symptoms get worse. Do not continue with sitz baths if your symptoms get worse.  You have new symptoms. Do not continue with sitz baths until you talk with your health care provider. This information is not intended to replace advice given to you by your health care provider. Make sure you discuss any questions you have with your health care provider. Document Released: 07/18/2004 Document Revised: 03/25/2016 Document Reviewed: 10/24/2014 Elsevier Interactive Patient Education  [employer].

## 2017-07-29 ENCOUNTER — Encounter: Payer: Self-pay | Admitting: Family Medicine

## 2017-08-02 DIAGNOSIS — L219 Seborrheic dermatitis, unspecified: Secondary | ICD-10-CM | POA: Diagnosis not present

## 2017-08-02 DIAGNOSIS — L578 Other skin changes due to chronic exposure to nonionizing radiation: Secondary | ICD-10-CM | POA: Diagnosis not present

## 2017-08-03 DIAGNOSIS — M461 Sacroiliitis, not elsewhere classified: Secondary | ICD-10-CM | POA: Diagnosis not present

## 2017-08-04 DIAGNOSIS — L578 Other skin changes due to chronic exposure to nonionizing radiation: Secondary | ICD-10-CM | POA: Insufficient documentation

## 2017-08-12 DIAGNOSIS — M461 Sacroiliitis, not elsewhere classified: Secondary | ICD-10-CM | POA: Diagnosis not present

## 2017-08-19 DIAGNOSIS — M461 Sacroiliitis, not elsewhere classified: Secondary | ICD-10-CM | POA: Diagnosis not present

## 2017-08-24 DIAGNOSIS — M461 Sacroiliitis, not elsewhere classified: Secondary | ICD-10-CM | POA: Diagnosis not present

## 2017-09-07 DIAGNOSIS — M461 Sacroiliitis, not elsewhere classified: Secondary | ICD-10-CM | POA: Diagnosis not present

## 2017-09-09 DIAGNOSIS — M5136 Other intervertebral disc degeneration, lumbar region: Secondary | ICD-10-CM | POA: Diagnosis not present

## 2017-09-09 DIAGNOSIS — Z6830 Body mass index (BMI) 30.0-30.9, adult: Secondary | ICD-10-CM | POA: Diagnosis not present

## 2017-09-09 DIAGNOSIS — M461 Sacroiliitis, not elsewhere classified: Secondary | ICD-10-CM | POA: Diagnosis not present

## 2017-09-11 DIAGNOSIS — M461 Sacroiliitis, not elsewhere classified: Secondary | ICD-10-CM | POA: Diagnosis not present

## 2017-09-14 DIAGNOSIS — M461 Sacroiliitis, not elsewhere classified: Secondary | ICD-10-CM | POA: Diagnosis not present

## 2017-09-17 DIAGNOSIS — M461 Sacroiliitis, not elsewhere classified: Secondary | ICD-10-CM | POA: Diagnosis not present

## 2017-09-24 DIAGNOSIS — M461 Sacroiliitis, not elsewhere classified: Secondary | ICD-10-CM | POA: Diagnosis not present

## 2017-09-27 DIAGNOSIS — M461 Sacroiliitis, not elsewhere classified: Secondary | ICD-10-CM | POA: Diagnosis not present

## 2017-10-05 DIAGNOSIS — M461 Sacroiliitis, not elsewhere classified: Secondary | ICD-10-CM | POA: Diagnosis not present

## 2017-10-22 DIAGNOSIS — M461 Sacroiliitis, not elsewhere classified: Secondary | ICD-10-CM | POA: Diagnosis not present

## 2017-10-25 DIAGNOSIS — M5432 Sciatica, left side: Secondary | ICD-10-CM | POA: Diagnosis not present

## 2017-10-25 DIAGNOSIS — M9903 Segmental and somatic dysfunction of lumbar region: Secondary | ICD-10-CM | POA: Diagnosis not present

## 2017-10-27 DIAGNOSIS — M9903 Segmental and somatic dysfunction of lumbar region: Secondary | ICD-10-CM | POA: Diagnosis not present

## 2017-10-27 DIAGNOSIS — M5432 Sciatica, left side: Secondary | ICD-10-CM | POA: Diagnosis not present

## 2017-10-28 DIAGNOSIS — M5432 Sciatica, left side: Secondary | ICD-10-CM | POA: Diagnosis not present

## 2017-10-28 DIAGNOSIS — M9903 Segmental and somatic dysfunction of lumbar region: Secondary | ICD-10-CM | POA: Diagnosis not present

## 2017-10-29 DIAGNOSIS — M461 Sacroiliitis, not elsewhere classified: Secondary | ICD-10-CM | POA: Diagnosis not present

## 2017-11-04 DIAGNOSIS — M9903 Segmental and somatic dysfunction of lumbar region: Secondary | ICD-10-CM | POA: Diagnosis not present

## 2017-11-04 DIAGNOSIS — M5432 Sciatica, left side: Secondary | ICD-10-CM | POA: Diagnosis not present

## 2017-11-08 DIAGNOSIS — M5432 Sciatica, left side: Secondary | ICD-10-CM | POA: Diagnosis not present

## 2017-11-08 DIAGNOSIS — M9903 Segmental and somatic dysfunction of lumbar region: Secondary | ICD-10-CM | POA: Diagnosis not present

## 2017-11-11 DIAGNOSIS — M5432 Sciatica, left side: Secondary | ICD-10-CM | POA: Diagnosis not present

## 2017-11-11 DIAGNOSIS — M9903 Segmental and somatic dysfunction of lumbar region: Secondary | ICD-10-CM | POA: Diagnosis not present

## 2017-11-12 DIAGNOSIS — M461 Sacroiliitis, not elsewhere classified: Secondary | ICD-10-CM | POA: Diagnosis not present

## 2017-11-16 DIAGNOSIS — M5432 Sciatica, left side: Secondary | ICD-10-CM | POA: Diagnosis not present

## 2017-11-16 DIAGNOSIS — M9903 Segmental and somatic dysfunction of lumbar region: Secondary | ICD-10-CM | POA: Diagnosis not present

## 2017-11-18 DIAGNOSIS — M9903 Segmental and somatic dysfunction of lumbar region: Secondary | ICD-10-CM | POA: Diagnosis not present

## 2017-11-18 DIAGNOSIS — M5432 Sciatica, left side: Secondary | ICD-10-CM | POA: Diagnosis not present

## 2017-11-22 DIAGNOSIS — M9903 Segmental and somatic dysfunction of lumbar region: Secondary | ICD-10-CM | POA: Diagnosis not present

## 2017-11-22 DIAGNOSIS — M5432 Sciatica, left side: Secondary | ICD-10-CM | POA: Diagnosis not present

## 2017-11-24 DIAGNOSIS — M5432 Sciatica, left side: Secondary | ICD-10-CM | POA: Diagnosis not present

## 2017-11-24 DIAGNOSIS — M9903 Segmental and somatic dysfunction of lumbar region: Secondary | ICD-10-CM | POA: Diagnosis not present

## 2017-11-30 DIAGNOSIS — M5432 Sciatica, left side: Secondary | ICD-10-CM | POA: Diagnosis not present

## 2017-11-30 DIAGNOSIS — M9903 Segmental and somatic dysfunction of lumbar region: Secondary | ICD-10-CM | POA: Diagnosis not present

## 2017-12-06 DIAGNOSIS — M5432 Sciatica, left side: Secondary | ICD-10-CM | POA: Diagnosis not present

## 2017-12-06 DIAGNOSIS — M9903 Segmental and somatic dysfunction of lumbar region: Secondary | ICD-10-CM | POA: Diagnosis not present

## 2017-12-21 DIAGNOSIS — M9903 Segmental and somatic dysfunction of lumbar region: Secondary | ICD-10-CM | POA: Diagnosis not present

## 2017-12-21 DIAGNOSIS — M5432 Sciatica, left side: Secondary | ICD-10-CM | POA: Diagnosis not present

## 2018-01-04 DIAGNOSIS — M9903 Segmental and somatic dysfunction of lumbar region: Secondary | ICD-10-CM | POA: Diagnosis not present

## 2018-01-04 DIAGNOSIS — M5432 Sciatica, left side: Secondary | ICD-10-CM | POA: Diagnosis not present

## 2018-01-13 DIAGNOSIS — M9903 Segmental and somatic dysfunction of lumbar region: Secondary | ICD-10-CM | POA: Diagnosis not present

## 2018-01-13 DIAGNOSIS — M5432 Sciatica, left side: Secondary | ICD-10-CM | POA: Diagnosis not present

## 2018-01-25 DIAGNOSIS — J101 Influenza due to other identified influenza virus with other respiratory manifestations: Secondary | ICD-10-CM | POA: Diagnosis not present

## 2018-01-25 DIAGNOSIS — R0989 Other specified symptoms and signs involving the circulatory and respiratory systems: Secondary | ICD-10-CM | POA: Diagnosis not present

## 2018-01-25 DIAGNOSIS — R05 Cough: Secondary | ICD-10-CM | POA: Diagnosis not present

## 2018-01-25 DIAGNOSIS — J029 Acute pharyngitis, unspecified: Secondary | ICD-10-CM | POA: Diagnosis not present

## 2018-02-01 DIAGNOSIS — M9903 Segmental and somatic dysfunction of lumbar region: Secondary | ICD-10-CM | POA: Diagnosis not present

## 2018-02-01 DIAGNOSIS — M5432 Sciatica, left side: Secondary | ICD-10-CM | POA: Diagnosis not present

## 2018-02-15 DIAGNOSIS — M5432 Sciatica, left side: Secondary | ICD-10-CM | POA: Diagnosis not present

## 2018-02-15 DIAGNOSIS — M9903 Segmental and somatic dysfunction of lumbar region: Secondary | ICD-10-CM | POA: Diagnosis not present

## 2018-03-03 DIAGNOSIS — M9903 Segmental and somatic dysfunction of lumbar region: Secondary | ICD-10-CM | POA: Diagnosis not present

## 2018-03-03 DIAGNOSIS — M5432 Sciatica, left side: Secondary | ICD-10-CM | POA: Diagnosis not present

## 2018-03-10 DIAGNOSIS — M5432 Sciatica, left side: Secondary | ICD-10-CM | POA: Diagnosis not present

## 2018-03-10 DIAGNOSIS — M9903 Segmental and somatic dysfunction of lumbar region: Secondary | ICD-10-CM | POA: Diagnosis not present

## 2018-03-16 DIAGNOSIS — H43811 Vitreous degeneration, right eye: Secondary | ICD-10-CM | POA: Diagnosis not present

## 2018-03-17 DIAGNOSIS — M5432 Sciatica, left side: Secondary | ICD-10-CM | POA: Diagnosis not present

## 2018-03-17 DIAGNOSIS — M9903 Segmental and somatic dysfunction of lumbar region: Secondary | ICD-10-CM | POA: Diagnosis not present

## 2018-03-22 DIAGNOSIS — M5432 Sciatica, left side: Secondary | ICD-10-CM | POA: Diagnosis not present

## 2018-03-22 DIAGNOSIS — M9903 Segmental and somatic dysfunction of lumbar region: Secondary | ICD-10-CM | POA: Diagnosis not present

## 2018-03-27 DIAGNOSIS — T07XXXA Unspecified multiple injuries, initial encounter: Secondary | ICD-10-CM | POA: Diagnosis not present

## 2018-04-20 DIAGNOSIS — M5432 Sciatica, left side: Secondary | ICD-10-CM | POA: Diagnosis not present

## 2018-04-20 DIAGNOSIS — M9903 Segmental and somatic dysfunction of lumbar region: Secondary | ICD-10-CM | POA: Diagnosis not present

## 2018-05-09 DIAGNOSIS — M5432 Sciatica, left side: Secondary | ICD-10-CM | POA: Diagnosis not present

## 2018-05-09 DIAGNOSIS — M9903 Segmental and somatic dysfunction of lumbar region: Secondary | ICD-10-CM | POA: Diagnosis not present

## 2018-05-16 DIAGNOSIS — M5432 Sciatica, left side: Secondary | ICD-10-CM | POA: Diagnosis not present

## 2018-05-16 DIAGNOSIS — M9903 Segmental and somatic dysfunction of lumbar region: Secondary | ICD-10-CM | POA: Diagnosis not present

## 2018-05-26 DIAGNOSIS — M5432 Sciatica, left side: Secondary | ICD-10-CM | POA: Diagnosis not present

## 2018-05-26 DIAGNOSIS — M9903 Segmental and somatic dysfunction of lumbar region: Secondary | ICD-10-CM | POA: Diagnosis not present

## 2018-06-07 DIAGNOSIS — M9903 Segmental and somatic dysfunction of lumbar region: Secondary | ICD-10-CM | POA: Diagnosis not present

## 2018-06-07 DIAGNOSIS — M5432 Sciatica, left side: Secondary | ICD-10-CM | POA: Diagnosis not present

## 2018-06-14 ENCOUNTER — Telehealth: Payer: Self-pay | Admitting: *Deleted

## 2018-06-14 NOTE — Telephone Encounter (Signed)
Copied from [place]. Topic: Appointment Scheduling - Scheduling Inquiry for Clinic >> Jun 14, 2018  4:52 PM Berneta Levins wrote: Reason for CRM:  Pt calling:  requests transfer from Dr. Martinique to Dr. Anitra Lauth - pt lives in [place].   Pt can be reached at [phone]

## 2018-06-14 NOTE — Telephone Encounter (Signed)
Dr. Martinique, okay for transfer?

## 2018-06-15 NOTE — Telephone Encounter (Signed)
Patient has been scheduled for 07/13/18

## 2018-06-15 NOTE — Telephone Encounter (Signed)
Dr. Anitra Lauth, okay for transfer?

## 2018-06-15 NOTE — Telephone Encounter (Signed)
It is fine with me. Thanks, BJ

## 2018-06-15 NOTE — Telephone Encounter (Signed)
Yes this is ok 

## 2018-07-13 ENCOUNTER — Encounter: Payer: Self-pay | Admitting: Family Medicine

## 2018-07-13 ENCOUNTER — Ambulatory Visit: Payer: BLUE CROSS/BLUE SHIELD | Admitting: Family Medicine

## 2018-07-13 VITALS — BP 100/63 | HR 85 | Temp 98.2°F | Resp 16 | Ht 65.0 in | Wt 187.5 lb

## 2018-07-13 DIAGNOSIS — F17200 Nicotine dependence, unspecified, uncomplicated: Secondary | ICD-10-CM | POA: Diagnosis not present

## 2018-07-13 DIAGNOSIS — Z1231 Encounter for screening mammogram for malignant neoplasm of breast: Secondary | ICD-10-CM | POA: Diagnosis not present

## 2018-07-13 DIAGNOSIS — Z716 Tobacco abuse counseling: Secondary | ICD-10-CM

## 2018-07-13 DIAGNOSIS — E669 Obesity, unspecified: Secondary | ICD-10-CM

## 2018-07-13 DIAGNOSIS — H8110 Benign paroxysmal vertigo, unspecified ear: Secondary | ICD-10-CM | POA: Diagnosis not present

## 2018-07-13 DIAGNOSIS — Z86010 Personal history of colonic polyps: Secondary | ICD-10-CM

## 2018-07-13 DIAGNOSIS — Z1239 Encounter for other screening for malignant neoplasm of breast: Secondary | ICD-10-CM

## 2018-07-13 MED ORDER — BUPROPION HCL ER (XL) 150 MG PO TB24: 150.0000 mg | ORAL_TABLET | Freq: Every day | ORAL | 1 refills | Status: DC

## 2018-07-13 NOTE — Patient Instructions (Signed)
Contact Dr. Ulyses Amor office to schedule colonoscopy: [phone]

## 2018-07-13 NOTE — Progress Notes (Signed)
Office Note 07/14/2018  CC:  Chief Complaint  Patient presents with  . Transfer of Care  . Referral    for colonoscopy and mammogram  . Dizziness    from Jewel? has stoppped using and now uses patch    HPI:  [name] is a 57 y.o. White female who is here to transfer care. Patient's most recent primary MD: Dr. Martinique [facility]). Old records in EPIC/[facility] EMR were reviewed prior to or during today's visit.  She requests mammogram (screening) to be ordered for her today.  Reports onset of vertiginous episodes about 5 mo ago, has one episode per night when she lies down in bed and rolls over, lasts 20 seconds or so, +nausea but no vomiting.  No fevers, no focal weakness, no HAs, no hearing changes, no ringing in ears. No preceding head trauma.  Discussed smoking hx and attempts at cessation today. Has been trying e cigs, most recently jewel. Nicotine patch "highest dose" last 3 wks. Has never tried wellbutrin or chantix.  Is wary of chantix b/c of hearing about side effect of bad dreams.    Past Medical History:  Diagnosis Date  . Acute appendicitis with rupture 2009  . Chronic headaches   . Depression   . History of adenomatous polyp of colon 03/09/2014   tubular adenoma 2011   . Hyperlipidemia   . Migraine   . NASH (nonalcoholic steatohepatitis) 03/21/2010   Qualifier: Diagnosis of  By: [name] MD, [name]   . OSA (obstructive sleep apnea) 10/14/2012   Sleep study completed on 09/22/2012-interpreting physician Clinton D. Young MD  Impression: 1. Mild ulcerative sleep apnea/Cytoxan syndrome, AHI 7.1 per hour. 2. Moderate scarring with oxygen saturations were -87% with mean saturation to the study night at 92% on room air. 3. Regular cardiac rhythm and the heart rate is 60 beats per minute.     Past Surgical History:  Procedure Laterality Date  . ABDOMINAL HYSTERECTOMY  2010   [name]  . CHOLECYSTECTOMY N/A 03/10/2014   Procedure: LAPAROSCOPIC CHOLECYSTECTOMY WITH  INTRAOPERATIVE CHOLANGIOGRAM;  Surgeon: [name], MD;  Location: [facility];  Service: General;  Laterality: N/A;  . COLONOSCOPY W/ BIOPSIES  2011; 03/2014   Dr [name] 2011.  [name] 2015 (polyps-->recall 5 yrs).  . ESOPHAGOGASTRODUODENOSCOPY  2011   Dr [name]  . LAPAROSCOPIC APPENDECTOMY  2009   [name]  . TEMPOROMANDIBULAR JOINT ARTHROPLASTY    . TEMPOROMANDIBULAR JOINT SURGERY    . TONSILLECTOMY      Family History  Problem Relation Age of Onset  . Heart disease Father   . Hypertension Father   . Hemochromatosis Father   . Diabetes Mother   . Diabetes Brother   . Hyperlipidemia Unknown   . Arthritis Unknown   . Sudden death Unknown   . Alcohol abuse Maternal Grandfather     Social History   Socioeconomic History  . Marital status: Married    Spouse name: Not on file  . Number of children: 0  . Years of education: College  . Highest education level: Not on file  Occupational History  . Occupation: Cabin crew and benefits    Comment: [employer]  Social Needs  . Financial resource strain: Not on file  . Food insecurity:    Worry: Not on file    Inability: Not on file  . Transportation needs:    Medical: Not on file    Non-medical: Not on file  Tobacco  Use  . Smoking status: Former Smoker    Packs/day: 1.50    Years: 32.00    Pack years: 48.00    Types: Cigarettes    Last attempt to quit: 08/01/2016    Years since quitting: 1.9  . Smokeless tobacco: Never Used  Substance and Sexual Activity  . Alcohol use: Yes    Alcohol/week: 0.0 standard drinks    Comment: occassional  . Drug use: No  . Sexual activity: Yes    Birth control/protection: Surgical  Lifestyle  . Physical activity:    Days per week: Not on file    Minutes per session: Not on file  . Stress: Not on file  Relationships  . Social connections:    Talks on phone: Not on file    Gets together: Not on file    Attends religious service: Not on file    Active  member of club or organization: Not on file    Attends meetings of clubs or organizations: Not on file    Relationship status: Not on file  . Intimate partner violence:    Fear of current or ex partner: Not on file    Emotionally abused: Not on file    Physically abused: Not on file    Forced sexual activity: Not on file  Other Topics Concern  . Not on file  Social History Narrative   She is originally from [place].  Relocated to [place] from [place].   She works for [employer].   Married.   No children.   No alcohol.   Tobacco: 60 pack-yr hx: switched to e cig 2017.   MEDS: NONE CURRENTLY  Allergies  Allergen Reactions  . Amoxicillin-Pot Clavulanate Hives, Rash, Other (See Comments) and Hypertension    Extreme headache Has patient had a PCN reaction causing immediate rash, facial/tongue/throat swelling, SOB or lightheadedness with hypotension: Yes Has patient had a PCN reaction causing severe rash involving mucus membranes or skin necrosis: Yes  Has patient had a PCN reaction that required hospitalization No Has patient had a PCN reaction occurring within the last 10 years: Yes - 2010 If all of the above answers are "NO", then may proceed with Cephalosporin use.   . Bee Venom Swelling    Swelling at the site   . Shellfish Allergy Swelling    joints    ROS Review of Systems  Constitutional: Negative for fatigue and fever.  HENT: Negative for congestion and sore throat.   Eyes: Negative for visual disturbance.  Respiratory: Negative for cough.   Cardiovascular: Negative for chest pain.  Gastrointestinal: Negative for abdominal pain and nausea.  Genitourinary: Negative for dysuria.  Musculoskeletal: Negative for back pain and joint swelling.  Skin: Negative for rash.  Neurological: Positive for dizziness. Negative for tremors, seizures, syncope, speech difficulty, weakness, numbness and headaches.  Hematological: Negative for adenopathy.  Psychiatric/Behavioral:  Negative for confusion and dysphoric mood.    PE; Blood pressure 100/63, pulse 85, temperature 98.2 F (36.8 C), temperature source Oral, resp. rate 16, height 5' 5"  (1.651 m), weight 187 lb 8 oz (85 kg), SpO2 97 %. Gen: Alert, well appearing.  Patient is oriented to person, place, time, and situation. AFFECT: pleasant, lucid thought and speech. JJO:ACZY: no injection, icteris, swelling, or exudate.  EOMI, PERRLA. Mouth: lips without lesion/swelling.  Oral mucosa pink and moist. Oropharynx without erythema, exudate, or swelling.  CV: RRR, no m/r/g.   LUNGS: CTA bilat, nonlabored resps, good aeration in all lung fields. EXT: no  clubbing or cyanosis.  no edema.  Neuro: CN 2-12 intact bilaterally, strength 5/5 in proximal and distal upper extremities and lower extremities bilaterally.  No sensory deficits.  No tremor.  No ataxia.  Upper extremity and lower extremity DTRs symmetric.  No pronator drift. Dix Halpike: with head to the R she has signif vertigo with classic rotary nystagmus--these last about 15 seconds.  With head turned to L she has minimal feeling of vertigo and just a hint of rotary nystagmus.    Pertinent labs:  Lab Results  Component Value Date   TSH 1.03 12/16/2016   Lab Results  Component Value Date   WBC 9.7 05/04/2016   HGB 13.6 05/04/2016   HCT 38.9 05/04/2016   MCV 91.5 05/04/2016   PLT 236 05/04/2016   Lab Results  Component Value Date   CREATININE 0.98 12/16/2016   BUN 16 12/16/2016   NA 138 12/16/2016   K 4.8 12/16/2016   CL 103 12/16/2016   CO2 28 12/16/2016   Lab Results  Component Value Date   ALT 69 (H) 12/16/2016   AST 44 (H) 12/16/2016   ALKPHOS 58 12/16/2016   BILITOT 0.5 12/16/2016   Lab Results  Component Value Date   CHOL 280 (H) 12/16/2016   Lab Results  Component Value Date   HDL 48.70 12/16/2016   Lab Results  Component Value Date   LDLCALC 141 (H) 10/19/2014   Lab Results  Component Value Date   TRIG 201.0 (H) 12/16/2016    Lab Results  Component Value Date   CHOLHDL 6 12/16/2016     ASSESSMENT AND PLAN:   Transfer pt:  1) Tobacco dependence, discussed cessation today.  Plan: continue nicotine patches (62m qd x 150mo1444md x 15mo72moen 7 mg qd x 15mo 62mothen stop).  Discussed use of nicotine losenge or gum qid prn cravings during this time. Also, will start wellbutrin xl 150mg 9m Therapeutic expectations and side effect profile of medication discussed today.  Patient's questions answered.  2) BPPV: home epley's maneuvers reviewed with pt, handout given.  3) Breast ca screening: screening mammogram ordered.  4) Colon ca screening/hx of adenomatous colon polyps: she is due for repeat in 2020 but she prefers to get back earlier than that. We provided her with Dr. Hung'sUlyses Amorct info so she could call his office to arrange f/u.  An After Visit Summary was printed and given to the patient.  Return in about 4 weeks (around 08/10/2018) for 30 min f/u smoking cessation.  Signed:  Phil MCrissie Sickles         07/14/2018

## 2018-07-15 ENCOUNTER — Ambulatory Visit: Payer: Self-pay | Admitting: Family Medicine

## 2018-07-15 ENCOUNTER — Encounter: Payer: Self-pay | Admitting: Family Medicine

## 2018-07-15 DIAGNOSIS — H8110 Benign paroxysmal vertigo, unspecified ear: Secondary | ICD-10-CM

## 2018-07-15 MED ORDER — PROMETHAZINE HCL 12.5 MG PO TABS: ORAL_TABLET | ORAL | 1 refills | Status: DC

## 2018-07-15 NOTE — Telephone Encounter (Signed)
Tell pt to take a break from doing the  repositioning exercises for the weekend. I'll send phenergan to her pharmacy--this will help with the dizziness a little bit and also help the nausea. I am going to refer her physical therapy for "vestibular rehabilitation".-thx

## 2018-07-15 NOTE — Telephone Encounter (Signed)
Patient advised.  Patient would like to go to [facility] if they offer the vestibular rehabilitation therapy.

## 2018-07-15 NOTE — Telephone Encounter (Signed)
Exercises are not helping dizziness- making /getting worse. Patient states she had to get some one to drive her home from work- best contact [phone]. Patient was not scheduled at this point- message sent for provider for review.  Reason for Disposition . [1] MODERATE dizziness (e.g., vertigo; feels very unsteady, interferes with normal activities) AND [2] has been evaluated by physician for this  Answer Assessment - Initial Assessment Questions 1. DESCRIPTION: "Describe your dizziness."     Patient reports the dizziness she is having today is the same as she had previously - she states it is worse- not just at night- now all day since she started doing exerises 2. VERTIGO: "Do you feel like either you or the room is spinning or tilting?"      spinning 3. LIGHTHEADED: "Do you feel lightheaded?" (e.g., somewhat faint, woozy, weak upon standing)     yes 4. SEVERITY: "How bad is it?"  "Can you walk?"   - MILD - Feels unsteady but walking normally.   - MODERATE - Feels very unsteady when walking, but not falling; interferes with normal activities (e.g., school, work) .   - SEVERE - Unable to walk without falling (requires assistance).     Moderate 5. ONSET:  "When did the dizziness begin?"     Patient was seen in office Wednesday and diagnosed- worse now 6. AGGRAVATING FACTORS: "Does anything make it worse?" (e.g., standing, change in head position)     Exercises given makes it worse- spinning sensation doesn't go away 7. CAUSE: "What do you think is causing the dizziness?"     Never been this bad during the day 8. RECURRENT SYMPTOM: "Have you had dizziness before?" If so, ask: "When was the last time?" "What happened that time?"     See above 9. OTHER SYMPTOMS: "Do you have any other symptoms?" (e.g., headache, weakness, numbness, vomiting, earache)     Vomiting, headache, nervous/anxieous  10. PREGNANCY: "Is there any chance you are pregnant?" "When was your last menstrual period?"   n/a  Protocols used: DIZZINESS - VERTIGO-A-AH

## 2018-08-01 ENCOUNTER — Telehealth: Payer: Self-pay | Admitting: *Deleted

## 2018-08-01 ENCOUNTER — Telehealth: Payer: Self-pay | Admitting: Family Medicine

## 2018-08-01 NOTE — Telephone Encounter (Signed)
-----   Message from Onalee Hua, [place] sent at 08/01/2018 11:48 AM EDT ----- Please advise. Thanks.  ----- Message ----- From: Nickola Major Sent: 08/01/2018  10:38 AM EDT To: Onalee Hua, CMA  When the breast center contacted the patient to schedule her screening mammo she mentioned she has been having pain in one of her breast & that she would contact our office about having an order put in for a diagnostic mammo. I don't see a note but I noticed she has an office visit next Friday 10/4.

## 2018-08-01 NOTE — Telephone Encounter (Signed)
Pls make sure this upcoming appt is for 30 min, and tell pt we'll have to address/examine her breast complaint and then make appropriate imaging order at that time.-thx

## 2018-08-01 NOTE — Telephone Encounter (Signed)
Opened in error

## 2018-08-01 NOTE — Telephone Encounter (Signed)
Apt for 08/12/18 changed to 30 min apt.   Tried calling pt, NA, line busy.

## 2018-08-12 ENCOUNTER — Ambulatory Visit: Payer: BLUE CROSS/BLUE SHIELD | Admitting: Family Medicine

## 2018-08-17 ENCOUNTER — Encounter: Payer: Self-pay | Admitting: Family Medicine

## 2018-08-17 ENCOUNTER — Ambulatory Visit: Payer: BLUE CROSS/BLUE SHIELD | Admitting: Family Medicine

## 2018-08-17 VITALS — BP 115/74 | HR 70 | Temp 97.9°F | Resp 16 | Ht 65.0 in | Wt 185.5 lb

## 2018-08-17 DIAGNOSIS — N644 Mastodynia: Secondary | ICD-10-CM

## 2018-08-17 DIAGNOSIS — F17200 Nicotine dependence, unspecified, uncomplicated: Secondary | ICD-10-CM | POA: Diagnosis not present

## 2018-08-17 NOTE — Progress Notes (Signed)
OFFICE VISIT  08/17/2018   CC:  Chief Complaint  Patient presents with  . Follow-up    Smoking Cessation  . Breast Pain    right    HPI:    Patient is a 57 y.o. Caucasian female who presents for f/u smoking cessation counseling. Nicotine patch regimen + wellbutrin XL started last visit 1 mo ago. She is on the wellbutrin and tolerating it fine.  Not on patches as of now, has been traveling lately, says she will restart them. She has not successfully quit smoking at this time.  Also, she went to get her mammogram done after last visit but since she told them she was having breast pain they advised o/v with me prior to getting mammo. When she rolls over on R breast she has a twinge of pain ---nonfocal.  She feels no lump. Skin has appeared normal.  Hx of fibrocystic breasts, LOTS of tenderness problems when she was having menses, not as bad since menopause.  L breast with similar discomfort when she rolls over on it sometimes.  No hx of breast mass or need for diagnostic breast imaging in the past.  No nipple d/c.   Past Medical History:  Diagnosis Date  . Acute appendicitis with rupture 2009  . BPPV (benign paroxysmal positional vertigo)    Vestib rehab ref-->07/15/18  . Chronic headaches   . Depression   . History of adenomatous polyp of colon 03/09/2014   tubular adenoma 2011   . Hyperlipidemia   . Migraine   . NASH (nonalcoholic steatohepatitis) 03/21/2010   Qualifier: Diagnosis of  By: [name] MD, [name]   . OSA (obstructive sleep apnea) 10/14/2012   Sleep study completed on 09/22/2012-interpreting physician Clinton D. Young MD  Impression: 1. 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 2. Moderate scarring with oxygen saturations were -87% with mean saturation to the study night at 92% on room air. 3. Regular cardiac rhythm and the heart rate is 60 beats per minute.   . Tobacco dependence     Past Surgical History:  Procedure Laterality Date  . ABDOMINAL  HYSTERECTOMY  2010   [name]  . CHOLECYSTECTOMY N/A 03/10/2014   Procedure: LAPAROSCOPIC CHOLECYSTECTOMY WITH INTRAOPERATIVE CHOLANGIOGRAM;  Surgeon: [name], MD;  Location: [facility];  Service: General;  Laterality: N/A;  . COLONOSCOPY W/ BIOPSIES  2011; 03/2014   Dr [name] 2011.  [name] 2015 (polyps-->recall 5 yrs).  . ESOPHAGOGASTRODUODENOSCOPY  2011   Dr [name]  . LAPAROSCOPIC APPENDECTOMY  2009   [name]  . TEMPOROMANDIBULAR JOINT ARTHROPLASTY    . TEMPOROMANDIBULAR JOINT SURGERY    . TONSILLECTOMY      Outpatient Medications Prior to Visit  Medication Sig Dispense Refill  . buPROPion (WELLBUTRIN XL) 150 MG 24 hr tablet Take 1 tablet (150 mg total) by mouth daily. 30 tablet 1  . promethazine (PHENERGAN) 12.5 MG tablet 1-2 tabs po q6h prn dizziness and nausea 30 tablet 1   No facility-administered medications prior to visit.     Allergies  Allergen Reactions  . Amoxicillin-Pot Clavulanate Hives, Rash, Other (See Comments) and Hypertension    Extreme headache Has patient had a PCN reaction causing immediate rash, facial/tongue/throat swelling, SOB or lightheadedness with hypotension: Yes Has patient had a PCN reaction causing severe rash involving mucus membranes or skin necrosis: Yes  Has patient had a PCN reaction that required hospitalization No Has patient had a PCN reaction occurring within the last 10  years: Yes - 2010 If all of the above answers are "NO", then may proceed with Cephalosporin use.   . Bee Venom Swelling    Swelling at the site   . Shellfish Allergy Swelling    joints    ROS As per HPI  PE: Blood pressure 115/74, pulse 70, temperature 97.9 F (36.6 C), temperature source Oral, resp. rate 16, height 5' 5"  (1.651 m), weight 185 lb 8 oz (84.1 kg), SpO2 96 %. Pt examined with Helayne Seminole, CMA, as chaperone. Gen: Alert, well appearing.  Patient is oriented to person, place, time, and situation. AFFECT: pleasant, lucid thought and  speech. Breasts are symmetric.  No dominant, discrete, fixed  or suspicious masses are noted.  No skin or nipple changes or axillary nodes.  She has multiple, small, "ropy"-feeling lumpiness scattered throughout R breast>L breast.    LABS:    Chemistry      Component Value Date/Time   NA 138 12/16/2016 0823   K 4.8 12/16/2016 0823   CL 103 12/16/2016 0823   CO2 28 12/16/2016 0823   BUN 16 12/16/2016 0823   CREATININE 0.98 12/16/2016 0823   CREATININE 0.98 10/19/2014 1625      Component Value Date/Time   CALCIUM 9.6 12/16/2016 0823   ALKPHOS 58 12/16/2016 0823   AST 44 (H) 12/16/2016 0823   ALT 69 (H) 12/16/2016 0823   BILITOT 0.5 12/16/2016 0823       IMPRESSION AND PLAN:  1) R>L breast pain due to fibrocystic breasts. No worrisome abnormality on breast exam today that would necessitate any diagnostic imaging. She can proceed with screening mammogram as originally planned.  2) Tobacco dependence: still working on it.  Tolerating wellbutrin for the last month, using nicotine replacement on/off.  3) BPPV: starts vestibular rehab this week.  An After Visit Summary was printed and given to the patient.  FOLLOW UP: Return for as needed.  Signed:  Crissie Sickles, MD           08/17/2018

## 2018-08-18 DIAGNOSIS — H8110 Benign paroxysmal vertigo, unspecified ear: Secondary | ICD-10-CM | POA: Diagnosis not present

## 2018-08-30 DIAGNOSIS — H8110 Benign paroxysmal vertigo, unspecified ear: Secondary | ICD-10-CM | POA: Diagnosis not present

## 2018-09-09 ENCOUNTER — Other Ambulatory Visit: Payer: Self-pay | Admitting: Family Medicine

## 2018-09-09 NOTE — Telephone Encounter (Signed)
Taking for smoking cessation.   RF request for bupropion LOV: 08/17/18  Next ov: None Last written: 07/13/18 #30 w/ 1RF  Please advise. Thanks.

## 2018-10-24 ENCOUNTER — Telehealth: Payer: Self-pay | Admitting: *Deleted

## 2018-10-24 ENCOUNTER — Encounter: Payer: Self-pay | Admitting: *Deleted

## 2018-10-24 ENCOUNTER — Encounter: Payer: Self-pay | Admitting: Family Medicine

## 2018-10-24 NOTE — Telephone Encounter (Signed)
Received medical records from [facility].  I reviewed records and abstracted information into pts chart.   Records have been placed on Dr. Idelle Leech desk for review.

## 2018-10-25 ENCOUNTER — Encounter: Payer: Self-pay | Admitting: Family Medicine

## 2018-11-16 ENCOUNTER — Telehealth: Payer: Self-pay | Admitting: *Deleted

## 2018-11-16 DIAGNOSIS — Z1211 Encounter for screening for malignant neoplasm of colon: Secondary | ICD-10-CM

## 2018-11-16 NOTE — Telephone Encounter (Signed)
Referral ordered.   Left detailed message on pts home vm advising her that referral was ordered and someone would be calling her back.

## 2018-11-16 NOTE — Telephone Encounter (Signed)
Okay to put in referral for colonoscopy to [facility]? Please advise. Thanks.

## 2018-11-16 NOTE — Telephone Encounter (Signed)
Yes, this is fine.-thx

## 2018-11-16 NOTE — Telephone Encounter (Signed)
Copied from [place] [id number]. Topic: Referral - Request for Referral >> Nov 14, 2018  3:35 PM Alanda Slim E wrote: Has patient seen PCP for this complaint? Yes *If NO, is insurance requiring patient see PCP for this issue before PCP can refer them? Referral for which specialty: Colonoscopy Preferred provider/office: [facility] Reason for referral: Tried to schedule with [name] on website and was never called back, rather go to [facility]

## 2018-11-25 ENCOUNTER — Telehealth: Payer: Self-pay | Admitting: Gastroenterology

## 2018-11-25 NOTE — Telephone Encounter (Signed)
Dr. [name] previous records from [facility] is in [place].  Can you please review and advice on scheduling? Thanks

## 2018-11-25 NOTE — Telephone Encounter (Signed)
Dr [name] will need to review and accept the pt back into the practice.  We will need records sent here for review.  If they are in care everywhere you can just send a message to Dr [name] to review.

## 2018-12-09 NOTE — Telephone Encounter (Signed)
Pt calling.  States that she can't get anyone at GI to get her a colonoscopy scheduled.  Pt would like help from our office to get this expedited.

## 2018-12-13 DIAGNOSIS — H40011 Open angle with borderline findings, low risk, right eye: Secondary | ICD-10-CM | POA: Diagnosis not present

## 2018-12-13 DIAGNOSIS — H53453 Other localized visual field defect, bilateral: Secondary | ICD-10-CM | POA: Diagnosis not present

## 2018-12-13 DIAGNOSIS — H2513 Age-related nuclear cataract, bilateral: Secondary | ICD-10-CM | POA: Diagnosis not present

## 2018-12-13 NOTE — Telephone Encounter (Signed)
Left VM for patient. I called [facility]- [facility] notes are under review by Dr. [name]. Patient will be contacted when the physician approves the appointment.

## 2018-12-16 NOTE — Telephone Encounter (Signed)
I'm happy to see her but not sure if she really needs a colonoscopy now.    Besides seeing [facility], she also saw Carol Ada for GI care in 2015.  I can see a colonoscopy report from him, 2015 where he removed some polyps but I am unable to find the associated path reports.  Can you see about having the path report sent over here?  thanks

## 2018-12-26 ENCOUNTER — Ambulatory Visit: Payer: BLUE CROSS/BLUE SHIELD | Admitting: Family Medicine

## 2018-12-26 ENCOUNTER — Encounter: Payer: Self-pay | Admitting: Family Medicine

## 2018-12-26 VITALS — BP 100/60 | HR 79 | Temp 98.9°F | Resp 16 | Ht 65.0 in | Wt 177.6 lb

## 2018-12-26 DIAGNOSIS — L290 Pruritus ani: Secondary | ICD-10-CM | POA: Diagnosis not present

## 2018-12-26 MED ORDER — NYSTATIN 100000 UNIT/GM EX CREA: 1.0000 "application " | TOPICAL_CREAM | Freq: Two times a day (BID) | CUTANEOUS | 0 refills | Status: DC

## 2018-12-26 MED ORDER — HYDROCORTISONE 2.5 % RE CREA: 1.0000 "application " | TOPICAL_CREAM | Freq: Two times a day (BID) | RECTAL | 1 refills | Status: DC

## 2018-12-26 NOTE — Patient Instructions (Addendum)
Alternate application of nystatin cream with the anusol cream-->each one should be applied twice per day.  Continue to try to keep the area dry AND try to minimize wiping. Anal Pruritus Anal pruritus is when the area around the opening of the butt (anus) feels itchy. This itchy feeling often happens when the area is moist. Moisture may come from sweat or from a small amount of poop (stool) that is left on the area. Many other things can also cause the itching. These include:  Perfumed soaps and sprays.  Colored or scented toilet paper.  Washing the area too much.  Eating certain foods.  Having watery poop (diarrhea).  Certain skin conditions or other medical conditions. The itching often goes away with home care. Scratching can damage the skin and make the problem worse. Follow these instructions at home: Skin care      Keep clean by washing your body well. ? Clean the affected area with wet toilet paper or a wet washcloth. Do this after you poop and at bedtime. ? Avoid using soaps on this area. ? Dry the area well. Pat the area dry with toilet paper or a towel.  Do not scrub the area with anything, even toilet paper.  Do not scratch the itchy area. Scratching makes the itching worse.  Take a warm water bath (sitz bath) as told by your doctor. ? A sitz bath is a warm water bath that only comes up to your hips and covers your butt. A sitz bath may done at home in a bathtub. It may also be done with a portable sitz bath that fits over the toilet. ? Pat the area dry with a soft cloth after each bath.  Use creams or ointments as told by your doctor.  Do not use bubble bath, scented toilet paper, or genital deodorants. General instructions  Watch for any changes in your symptoms.  Use over-the-counter and prescription medicines only as told by your doctor.  Avoid using medicines that help you poop (laxatives).  Talk with your doctor about increasing the fiber in your diet.  This can help keep your poop normal if you often have loose poop.  Limit or avoid foods that may cause your symptoms. These foods may include: ? Spicy foods, such as salsa, jalapeo peppers, and spicy seasonings. ? Foods with caffeine in them. ? Beer. ? Milk products. ? Chocolate. ? Nuts. ? Citrus fruits. ? Tomatoes.  Wear cotton underwear and loose clothing.  Keep all follow-up visits as told by your doctor. This is important. Contact a doctor if:  Your itching does not get better after a few days.  Your itching gets worse.  You have a fever.  You have redness, swelling, or pain in the itchy area.  You have fluid, blood, or pus coming from the itchy area. Summary  Anal pruritus is when the area around the opening of the butt (anus) feels itchy.  Use over-the-counter and prescription medicines only as told by your doctor.  Keep clean by washing your body well as told by your doctor.  Talk with your doctor about taking fiber pills (supplements). This information is not intended to replace advice given to you by your health care provider. Make sure you discuss any questions you have with your health care provider. Document Released: 07/08/2011 Document Revised: 03/07/2018 Document Reviewed: 03/07/2018 Elsevier Interactive Patient Education  2019 [employer].

## 2018-12-26 NOTE — Progress Notes (Signed)
OFFICE VISIT  12/26/2018   CC:  Chief Complaint  Patient presents with  . bump on rectum   HPI:    Patient is a 58 y.o. Caucasian female who presents for rectal complaint.  Four to five month hx: She has noticed she has trouble getting her anal area clean.  It is very moist in that area.  Wipes excessively to clean herself. Burns and itches all the time.  No sx's in vagina/vulvar area.  Prep H has helped stop her occ BRBPR. She normally has 3-4 normal stools per day.  No signif constipation.  Remote hx of constipation.  Takes probiotic and an herbal supplement called zingular (protein shake) and the constipation has resolved. No excessive lifting/straining.  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   She is frustrated that she has not gotten contacted/scheduled about her colonoscopy yet.  She's worried that her current sx's need to be evaluated by a colonoscopy "? Something wrong with my bowels?".  Past Medical History:  Diagnosis Date  . Acute appendicitis with rupture 2009  . BPPV (benign paroxysmal positional vertigo)    Vestib rehab ref-->07/15/18  . Chronic headaches   . Depression   . History of adenomatous polyp of colon 03/09/2014   tubular adenoma 2011   . Hyperlipidemia   . Migraine   . NASH (nonalcoholic steatohepatitis) 03/21/2010   Qualifier: Diagnosis of  By: [name] MD, [name]   . OSA (obstructive sleep apnea) 10/14/2012   Sleep study completed on 09/22/2012-interpreting physician Clinton D. Young MD  Impression: 1. 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 2. Moderate scarring with oxygen saturations were -87% with mean saturation to the study night at 92% on room air. 3. Regular cardiac rhythm and the heart rate is 60 beats per minute.   Marland Kitchen [facility] spotted fever) 2017/18   Lyme neg  . Tobacco dependence     Past Surgical History:  Procedure Laterality Date  . ABDOMINAL HYSTERECTOMY  2010   [name]  . CHOLECYSTECTOMY  N/A 03/10/2014   Procedure: LAPAROSCOPIC CHOLECYSTECTOMY WITH INTRAOPERATIVE CHOLANGIOGRAM;  Surgeon: [name], MD;  Location: [facility];  Service: General;  Laterality: N/A;  . COLONOSCOPY W/ BIOPSIES  2011; 03/2014   Dr [name] 2011.  [name] 2015 (polyps-->recall 5 yrs).  . ESOPHAGOGASTRODUODENOSCOPY  2011   Dr [name]  . LAPAROSCOPIC APPENDECTOMY  2009   [name]  . TEMPOROMANDIBULAR JOINT ARTHROPLASTY    . TEMPOROMANDIBULAR JOINT SURGERY    . TONSILLECTOMY     MEDS: none at this time.  Allergies  Allergen Reactions  . Amoxicillin-Pot Clavulanate Hives, Rash, Other (See Comments) and Hypertension    Extreme headache Has patient had a PCN reaction causing immediate rash, facial/tongue/throat swelling, SOB or lightheadedness with hypotension: Yes Has patient had a PCN reaction causing severe rash involving mucus membranes or skin necrosis: Yes  Has patient had a PCN reaction that required hospitalization No Has patient had a PCN reaction occurring within the last 10 years: Yes - 2010 If all of the above answers are "NO", then may proceed with Cephalosporin use.   . Bee Venom Swelling    Swelling at the site   . Shellfish Allergy Swelling    joints    ROS As per HPI  PE: Blood pressure 100/60, pulse 79, temperature 98.9 F (37.2 C), temperature source Oral, resp. rate 16, height 5' 5"  (1.651 m), weight 177 lb 9.6 oz (80.6 kg),  SpO2 97 %. Body mass index is 29.55 kg/m. Chaperone/assistant Brittanae Staton in room with me. Gen: Alert, well appearing.  Patient is oriented to person, place, time, and situation. AFFECT: pleasant, lucid thought and speech. Anal exam: mild pinkish discoloration of skin in circumferential area around anus.  No hemorrhoids or fissures or masses. Normal rectal tone.  No macerated skin.  NO papular or vesicular rash.   LABS:    Chemistry      Component Value Date/Time   NA 138 12/16/2016 0823   K 4.8 12/16/2016 0823   CL 103 12/16/2016 0823    CO2 28 12/16/2016 0823   BUN 16 12/16/2016 0823   CREATININE 0.98 12/16/2016 0823   CREATININE 0.98 10/19/2014 1625      Component Value Date/Time   CALCIUM 9.6 12/16/2016 0823   ALKPHOS 58 12/16/2016 0823   AST 44 (H) 12/16/2016 0823   ALT 69 (H) 12/16/2016 0823   BILITOT 0.5 12/16/2016 0823       IMPRESSION AND PLAN:  Pruritis ani: instructions given -->minimize wiping, keep area dry as possible. Alternate application of nystatin cream and anusol hc 2.5% cream  (apply each bid) for the next 2 weeks..  Colon ca screening: [facility] is in the process of evaluating her records before they schedule her for an appt.  An After Visit Summary was printed and given to the patient.  FOLLOW UP: Return if symptoms worsen or fail to improve in 2 weeks.  Signed:  Crissie Sickles, MD           12/26/2018

## 2019-01-13 ENCOUNTER — Ambulatory Visit: Payer: BLUE CROSS/BLUE SHIELD | Admitting: Family Medicine

## 2019-01-13 ENCOUNTER — Encounter: Payer: Self-pay | Admitting: Family Medicine

## 2019-01-13 VITALS — BP 102/61 | HR 76 | Temp 98.0°F | Resp 16 | Ht 65.0 in | Wt 175.4 lb

## 2019-01-13 DIAGNOSIS — H6121 Impacted cerumen, right ear: Secondary | ICD-10-CM | POA: Diagnosis not present

## 2019-01-13 NOTE — Progress Notes (Signed)
OFFICE VISIT  01/13/2019   CC:  Chief Complaint  Patient presents with  . clogged right ear    x couple of weeks   HPI:    Patient is a 58 y.o. Caucasian female who presents for "clogged right ear". Decreased hearing in R ear has come on gradually starting 2 wks ago. Has chronic dec hearing on L already from hx of tymp memb rupture in remote past.  Just started to get a little painful and some ringing noted yesterday.  She has tried some otc irrigation sol'n and gotten "a good amount of wax" to come out.  Past Medical History:  Diagnosis Date  . Acute appendicitis with rupture 2009  . BPPV (benign paroxysmal positional vertigo)    Vestib rehab ref-->07/15/18  . Chronic headaches   . Depression   . History of adenomatous polyp of colon 03/09/2014   tubular adenoma 2011   . Hyperlipidemia   . Migraine   . NASH (nonalcoholic steatohepatitis) 03/21/2010   Qualifier: Diagnosis of  By: [name] MD, [name]   . OSA (obstructive sleep apnea) 10/14/2012   Sleep study completed on 09/22/2012-interpreting physician Clinton D. Young MD  Impression: 1. 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 2. Moderate scarring with oxygen saturations were -87% with mean saturation to the study night at 92% on room air. 3. Regular cardiac rhythm and the heart rate is 60 beats per minute.   Marland Kitchen [facility] spotted fever) 2017/18   Lyme neg  . Tobacco dependence     Past Surgical History:  Procedure Laterality Date  . ABDOMINAL HYSTERECTOMY  2010   [name]  . CHOLECYSTECTOMY N/A 03/10/2014   Procedure: LAPAROSCOPIC CHOLECYSTECTOMY WITH INTRAOPERATIVE CHOLANGIOGRAM;  Surgeon: [name], MD;  Location: [facility];  Service: General;  Laterality: N/A;  . COLONOSCOPY W/ BIOPSIES  2011; 03/2014   Dr [name] 2011.  [name] 2015 (polyps-->recall 5 yrs).  . ESOPHAGOGASTRODUODENOSCOPY  2011   Dr [name]  . LAPAROSCOPIC APPENDECTOMY  2009   [name]  . TEMPOROMANDIBULAR JOINT  ARTHROPLASTY    . TEMPOROMANDIBULAR JOINT SURGERY    . TONSILLECTOMY      Outpatient Medications Prior to Visit  Medication Sig Dispense Refill  . hydrocortisone (ANUSOL-HC) 2.5 % rectal cream Place 1 application rectally 2 (two) times daily. 30 g 1  . nystatin cream (MYCOSTATIN) Apply 1 application topically 2 (two) times daily. 30 g 0  . promethazine (PHENERGAN) 12.5 MG tablet 1-2 tabs po q6h prn dizziness and nausea (Patient not taking: Reported on 01/13/2019) 30 tablet 1  . buPROPion (WELLBUTRIN XL) 150 MG 24 hr tablet TAKE 1 TABLET BY MOUTH EVERY DAY (Patient not taking: Reported on 01/13/2019) 30 tablet 4   No facility-administered medications prior to visit.     Allergies  Allergen Reactions  . Amoxicillin-Pot Clavulanate Hives, Rash, Other (See Comments) and Hypertension    Extreme headache Has patient had a PCN reaction causing immediate rash, facial/tongue/throat swelling, SOB or lightheadedness with hypotension: Yes Has patient had a PCN reaction causing severe rash involving mucus membranes or skin necrosis: Yes  Has patient had a PCN reaction that required hospitalization No Has patient had a PCN reaction occurring within the last 10 years: Yes - 2010 If all of the above answers are "NO", then may proceed with Cephalosporin use.   . Bee Venom Swelling    Swelling at the site   . Shellfish Allergy Swelling    joints  ROS As per HPI  PE: Blood pressure 102/61, pulse 76, temperature 98 F (36.7 C), temperature source Oral, resp. rate 16, height 5' 5"  (1.651 m), weight 175 lb 6.4 oz (79.6 kg), SpO2 97 %. Gen: Alert, well appearing.  Patient is oriented to person, place, time, and situation. AFFECT: pleasant, lucid thought and speech. R ear with 100% cerumen impaction, brown and mildly moist cerumen. EAC normal otherwise.  Cannot visualize TM.  Ear was irrigated successfully by CMA today and all cerumen came out and I could see normal TM afterwards. L ear normal EAC  with minimal cerumen, TM w/out abnormality.  LABS:  NONE  IMPRESSION AND PLAN:  R ear cerumen impaction: irrigated R ear and got complete resolution of impaction and pt could hear normally again.  An After Visit Summary was printed and given to the patient.  FOLLOW UP: Return if symptoms worsen or fail to improve.  Signed:  Crissie Sickles, MD           01/13/2019

## 2019-03-24 NOTE — Telephone Encounter (Signed)
Pt's path report is in Epic under Labs.  Dr. [name], please review and advise on whether to schedule pt for colonoscopy.

## 2019-03-26 NOTE — Telephone Encounter (Signed)
Sorry, but I cannot find the pathology report for Dr. Ulyses Amor 2015 colonoscopy in Epic anywhere.

## 2019-07-20 DIAGNOSIS — H5360 Unspecified night blindness: Secondary | ICD-10-CM | POA: Diagnosis not present

## 2019-07-20 DIAGNOSIS — H401210 Low-tension glaucoma, right eye, stage unspecified: Secondary | ICD-10-CM | POA: Diagnosis not present

## 2019-07-20 DIAGNOSIS — H53453 Other localized visual field defect, bilateral: Secondary | ICD-10-CM | POA: Diagnosis not present

## 2019-07-28 ENCOUNTER — Other Ambulatory Visit: Payer: Self-pay | Admitting: *Deleted

## 2019-07-28 DIAGNOSIS — H401290 Low-tension glaucoma, unspecified eye, stage unspecified: Secondary | ICD-10-CM

## 2019-07-28 DIAGNOSIS — H5360 Unspecified night blindness: Secondary | ICD-10-CM

## 2019-07-28 DIAGNOSIS — H5340 Unspecified visual field defects: Secondary | ICD-10-CM

## 2019-08-21 ENCOUNTER — Other Ambulatory Visit: Payer: Self-pay | Admitting: *Deleted

## 2019-08-22 ENCOUNTER — Other Ambulatory Visit: Payer: BLUE CROSS/BLUE SHIELD

## 2019-09-11 ENCOUNTER — Other Ambulatory Visit: Payer: Self-pay | Admitting: Family Medicine

## 2019-09-11 NOTE — Telephone Encounter (Signed)
Having trouble sending controlled rx's electronically at this time.  Pls sent this encounter back to me 11/3 and I should be able to send it then.-thx

## 2019-09-11 NOTE — Telephone Encounter (Signed)
Please advise if this is appropriate? Thanks.

## 2019-09-11 NOTE — Telephone Encounter (Signed)
Patient is having a MRI on Wed 11/4, Dr. Oswaldo Conroy in [facility] Surgeon. Patient reports she is claustrophobic and is requesting 1 (one) valium for procedure. Dr. Oswaldo Conroy told patient to call her PCP.  Patient pharmacy is [facility]. Patient can be reached at [phone].  Thank you

## 2019-09-12 MED ORDER — DIAZEPAM 10 MG PO TABS: ORAL_TABLET | ORAL | 0 refills | Status: DC

## 2019-09-12 NOTE — Telephone Encounter (Signed)
Okay, diazepam eRx'd.

## 2019-09-12 NOTE — Telephone Encounter (Signed)
Patient advised that Rx was at her pharmacy.

## 2019-09-13 ENCOUNTER — Ambulatory Visit
Admission: RE | Admit: 2019-09-13 | Discharge: 2019-09-13 | Disposition: A | Discharge status: Home / Self Care | Payer: BC Managed Care – PPO | Source: Ambulatory Visit | Attending: *Deleted | Admitting: *Deleted

## 2019-09-13 ENCOUNTER — Other Ambulatory Visit: Payer: Self-pay

## 2019-09-13 DIAGNOSIS — H401290 Low-tension glaucoma, unspecified eye, stage unspecified: Secondary | ICD-10-CM

## 2019-09-13 DIAGNOSIS — H5340 Unspecified visual field defects: Secondary | ICD-10-CM

## 2019-09-13 DIAGNOSIS — H5360 Unspecified night blindness: Secondary | ICD-10-CM

## 2019-09-13 MED ORDER — GADOBENATE DIMEGLUMINE 529 MG/ML IV SOLN
17.0000 mL | Freq: Once | INTRAVENOUS | Status: AC | PRN
  Administered 2019-09-13: 17 mL via INTRAVENOUS

## 2019-09-27 DIAGNOSIS — H53453 Other localized visual field defect, bilateral: Secondary | ICD-10-CM | POA: Diagnosis not present

## 2019-10-10 DIAGNOSIS — E785 Hyperlipidemia, unspecified: Secondary | ICD-10-CM

## 2019-10-10 HISTORY — DX: Hyperlipidemia, unspecified: E78.5

## 2019-10-16 ENCOUNTER — Other Ambulatory Visit: Payer: Self-pay

## 2019-10-16 ENCOUNTER — Encounter: Payer: Self-pay | Admitting: Family Medicine

## 2019-10-16 ENCOUNTER — Ambulatory Visit (INDEPENDENT_AMBULATORY_CARE_PROVIDER_SITE_OTHER): Payer: BC Managed Care – PPO | Admitting: Family Medicine

## 2019-10-16 VITALS — BP 118/78 | HR 67 | Temp 99.2°F | Resp 16 | Ht 65.0 in | Wt 171.6 lb

## 2019-10-16 DIAGNOSIS — Z0000 Encounter for general adult medical examination without abnormal findings: Secondary | ICD-10-CM | POA: Diagnosis not present

## 2019-10-16 DIAGNOSIS — H538 Other visual disturbances: Secondary | ICD-10-CM

## 2019-10-16 DIAGNOSIS — H53489 Generalized contraction of visual field, unspecified eye: Secondary | ICD-10-CM | POA: Diagnosis not present

## 2019-10-16 DIAGNOSIS — E785 Hyperlipidemia, unspecified: Secondary | ICD-10-CM

## 2019-10-16 DIAGNOSIS — Z1231 Encounter for screening mammogram for malignant neoplasm of breast: Secondary | ICD-10-CM | POA: Diagnosis not present

## 2019-10-16 DIAGNOSIS — K7581 Nonalcoholic steatohepatitis (NASH): Secondary | ICD-10-CM

## 2019-10-16 NOTE — Progress Notes (Signed)
OFFICE VISIT  10/16/2019   CC:  Chief Complaint  Patient presents with  . Annual Exam    pt is not fasting     HPI:    Patient is a 58 y.o. Caucasian female who presents for annual health maintenance exam. GYN MD: Dr. John McComb.  Onset mo ago her peripheral vision diminished.  Impaired vision unless in very good lighting. Eye MD, question of glaucoma as cause but eye pressure normal.  She was referred to glaucoma specialist to see in Jan 2021. Occ pain in R orbit region.    No polyuria or polydipsia but she admits to drinking lots of soda.   Past Medical History:  Diagnosis Date  . BPPV (benign paroxysmal positional vertigo)    Vestib rehab ref-->07/15/18  . Chronic headaches   . Depression   . History of adenomatous polyp of colon 03/09/2014   tubular adenoma 2011   . Hyperlipidemia   . Migraine   . NASH (nonalcoholic steatohepatitis) 03/21/2010   Qualifier: Diagnosis of  By: Jacobs MD, Daniel P   . OSA (obstructive sleep apnea) 10/14/2012   Sleep study completed on 09/22/2012-interpreting physician Clinton D. Young MD  Impression: 1. 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 2. Moderate scarring with oxygen saturations were -87% with mean saturation to the study night at 92% on room air. 3. Regular cardiac rhythm and the heart rate is 60 beats per minute.   . RMSF ([place] spotted fever) 2017/18   Lyme neg  . Tobacco dependence    switched to vap 2017    Past Surgical History:  Procedure Laterality Date  . ABDOMINAL HYSTERECTOMY  2010   Dr J McComb  . CHOLECYSTECTOMY N/A 03/10/2014   Procedure: LAPAROSCOPIC CHOLECYSTECTOMY WITH INTRAOPERATIVE CHOLANGIOGRAM;  Surgeon: Todd M Gerkin, MD;  Location: [facility];  Service: General;  Laterality: N/A;  . COLONOSCOPY W/ BIOPSIES  2011; 03/2014   Dr Jacobs 2011.  Dr. Hung 2015 (polyps-->recall 5 yrs).  . ESOPHAGOGASTRODUODENOSCOPY  2011   Dr Jacobs  . LAPAROSCOPIC APPENDECTOMY  2009   Dr B Thompson  .  TEMPOROMANDIBULAR JOINT ARTHROPLASTY    . TEMPOROMANDIBULAR JOINT SURGERY    . TONSILLECTOMY      Outpatient Medications Prior to Visit  Medication Sig Dispense Refill  . hydrocortisone (ANUSOL-HC) 2.5 % rectal cream Place 1 application rectally 2 (two) times daily. 30 g 1  . nystatin cream (MYCOSTATIN) Apply 1 application topically 2 (two) times daily. 30 g 0  . promethazine (PHENERGAN) 12.5 MG tablet 1-2 tabs po q6h prn dizziness and nausea (Patient not taking: Reported on 01/13/2019) 30 tablet 1  . diazepam (VALIUM) 10 MG tablet 1 tab 1 hour prior to MRI 1 tablet 0   No facility-administered medications prior to visit.     Allergies  Allergen Reactions  . Amoxicillin-Pot Clavulanate Hives, Rash, Other (See Comments) and Hypertension    Extreme headache Has patient had a PCN reaction causing immediate rash, facial/tongue/throat swelling, SOB or lightheadedness with hypotension: Yes Has patient had a PCN reaction causing severe rash involving mucus membranes or skin necrosis: Yes  Has patient had a PCN reaction that required hospitalization No Has patient had a PCN reaction occurring within the last 10 years: Yes - 2010 If all of the above answers are "NO", then may proceed with Cephalosporin use.   . Bee Venom Swelling    Swelling at the site   . Shellfish Allergy Swelling    joints      ROS As per HPI  PE: Blood pressure 118/78, pulse 67, temperature 99.2 F (37.3 C), temperature source Temporal, resp. rate 16, height 5' 5" (1.651 m), weight 171 lb 9.6 oz (77.8 kg), SpO2 96 %. Body mass index is 28.56 kg/m. Exam chaperoned by Deveron Furlong, CMA.  Gen: Alert, well appearing.  Patient is oriented to person, place, time, and situation. AFFECT: pleasant, lucid thought and speech. ENT: Ears: EACs clear, normal epithelium.  TMs with good light reflex and landmarks bilaterally.  Eyes: no injection, icteris, swelling, or exudate.  EOMI, PERRLA. Nose: no drainage or turbinate  edema/swelling.  No injection or focal lesion.  Mouth: lips without lesion/swelling.  Oral mucosa pink and moist.  Dentition intact and without obvious caries or gingival swelling.  Oropharynx without erythema, exudate, or swelling.  Neck: supple/nontender.  No LAD, mass, or TM.  Carotid pulses 2+ bilaterally, without bruits. CV: RRR, no m/r/g.   LUNGS: CTA bilat, nonlabored resps, good aeration in all lung fields. ABD: soft, NT, ND, BS normal.  No hepatospenomegaly or mass.  No bruits. EXT: no clubbing, cyanosis, or edema.  Musculoskeletal: no joint swelling, erythema, warmth, or tenderness.  ROM of all joints intact. Skin - no sores or suspicious lesions or rashes or color changes    LABS:  Lab Results  Component Value Date   TSH 1.03 12/16/2016   Lab Results  Component Value Date   WBC 9.7 05/04/2016   HGB 13.6 05/04/2016   HCT 38.9 05/04/2016   MCV 91.5 05/04/2016   PLT 236 05/04/2016   Lab Results  Component Value Date   CREATININE 0.98 12/16/2016   BUN 16 12/16/2016   NA 138 12/16/2016   K 4.8 12/16/2016   CL 103 12/16/2016   CO2 28 12/16/2016   Lab Results  Component Value Date   ALT 69 (H) 12/16/2016   AST 44 (H) 12/16/2016   ALKPHOS 58 12/16/2016   BILITOT 0.5 12/16/2016   Lab Results  Component Value Date   CHOL 280 (H) 12/16/2016   Lab Results  Component Value Date   HDL 48.70 12/16/2016   Lab Results  Component Value Date   LDLCALC 141 (H) 10/19/2014   Lab Results  Component Value Date   TRIG 201.0 (H) 12/16/2016   Lab Results  Component Value Date   CHOLHDL 6 12/16/2016    IMPRESSION AND PLAN:   1) Blurry vision and visual field deficits: eye exam by ophtho unrevealing except signs of glaucoma--but eye pressure normal OU.  MRI brain and orbits ok. Unknown etiology.  Checking glucose, ESR, and acetylcholine receptor abs.  2)Health maintenance exam: Reviewed age and gender appropriate health maintenance issues (prudent diet, regular  exercise, health risks of tobacco and excessive alcohol, use of seatbelts, fire alarms in home, use of sunscreen).  Also reviewed age and gender appropriate health screening as well as vaccine recommendations. Labs: health panel labs ordered Cervical ca screening: hx TAH and BSO for nonmalignant reason.  No hx abnormal paps->no further paps/pelvis needed. Breast ca screening: mammogram. Colon ca screening: due for recall this year, Dr. Marletta Lor contact his office.  An After Visit Summary was printed and given to the patient.  FOLLOW UP: Return in about 1 year (around 10/15/2020) for annual CPE (fasting).  Signed:  Crissie Sickles, MD           10/16/2019

## 2019-10-18 ENCOUNTER — Other Ambulatory Visit: Payer: Self-pay

## 2019-10-18 ENCOUNTER — Ambulatory Visit (INDEPENDENT_AMBULATORY_CARE_PROVIDER_SITE_OTHER): Payer: BC Managed Care – PPO | Admitting: Family Medicine

## 2019-10-18 DIAGNOSIS — H53489 Generalized contraction of visual field, unspecified eye: Secondary | ICD-10-CM | POA: Diagnosis not present

## 2019-10-18 DIAGNOSIS — K7581 Nonalcoholic steatohepatitis (NASH): Secondary | ICD-10-CM | POA: Diagnosis not present

## 2019-10-18 DIAGNOSIS — H538 Other visual disturbances: Secondary | ICD-10-CM | POA: Diagnosis not present

## 2019-10-18 DIAGNOSIS — E785 Hyperlipidemia, unspecified: Secondary | ICD-10-CM

## 2019-10-18 LAB — COMPREHENSIVE METABOLIC PANEL
ALT: 29 U/L (ref 0–35)
AST: 21 U/L (ref 0–37)
Albumin: 4.5 g/dL (ref 3.5–5.2)
Alkaline Phosphatase: 60 U/L (ref 39–117)
BUN: 16 mg/dL (ref 6–23)
CO2: 28 mEq/L (ref 19–32)
Calcium: 10.3 mg/dL (ref 8.4–10.5)
Chloride: 102 mEq/L (ref 96–112)
Creatinine, Ser: 1.03 mg/dL (ref 0.40–1.20)
GFR: 54.71 mL/min — ABNORMAL LOW (ref >60.00)
Glucose, Bld: 122 mg/dL — ABNORMAL HIGH (ref 70–99)
Potassium: 4.8 mEq/L (ref 3.5–5.1)
Sodium: 138 mEq/L (ref 135–145)
Total Bilirubin: 0.5 mg/dL (ref 0.2–1.2)
Total Protein: 7.4 g/dL (ref 6.0–8.3)

## 2019-10-18 LAB — CBC WITH DIFFERENTIAL/PLATELET
Basophils Absolute: 0.1 10*3/uL (ref 0.0–0.1)
Basophils Relative: 0.9 % (ref 0.0–3.0)
Eosinophils Absolute: 0.1 10*3/uL (ref 0.0–0.7)
Eosinophils Relative: 1.6 % (ref 0.0–5.0)
HCT: 42.5 % (ref 36.0–46.0)
Hemoglobin: 14.1 g/dL (ref 12.0–15.0)
Lymphocytes Relative: 36.2 % (ref 12.0–46.0)
Lymphs Abs: 2.7 10*3/uL (ref 0.7–4.0)
MCHC: 33.2 g/dL (ref 30.0–36.0)
MCV: 93.7 fl (ref 78.0–100.0)
Monocytes Absolute: 0.4 10*3/uL (ref 0.1–1.0)
Monocytes Relative: 5.1 % (ref 3.0–12.0)
Neutro Abs: 4.1 10*3/uL (ref 1.4–7.7)
Neutrophils Relative %: 56.2 % (ref 43.0–77.0)
Platelets: 251 10*3/uL (ref 150.0–400.0)
RBC: 4.54 Mil/uL (ref 3.87–5.11)
RDW: 12.7 % (ref 11.5–15.5)
WBC: 7.4 10*3/uL (ref 4.0–10.5)

## 2019-10-18 LAB — LIPID PANEL
Cholesterol: 268 mg/dL — ABNORMAL HIGH (ref 0–200)
HDL: 50.8 mg/dL (ref >39.00)
NonHDL: 216.94
Total CHOL/HDL Ratio: 5
Triglycerides: 216 mg/dL — ABNORMAL HIGH (ref 0.0–149.0)
VLDL: 43.2 mg/dL — ABNORMAL HIGH (ref 0.0–40.0)

## 2019-10-18 LAB — TSH: TSH: 1.14 u[IU]/mL (ref 0.35–4.50)

## 2019-10-18 LAB — SEDIMENTATION RATE: Sed Rate: 22 mm/hr (ref 0–30)

## 2019-10-18 LAB — LDL CHOLESTEROL, DIRECT: Direct LDL: 176 mg/dL

## 2019-10-21 LAB — ACETYLCHOLINE RECEPTOR, BLOCKING: ACHR Blocking Abs: <15 % Inhibition (ref <15)

## 2019-10-24 ENCOUNTER — Encounter: Payer: Self-pay | Admitting: Family Medicine

## 2019-10-24 ENCOUNTER — Other Ambulatory Visit (INDEPENDENT_AMBULATORY_CARE_PROVIDER_SITE_OTHER): Payer: BC Managed Care – PPO

## 2019-10-24 ENCOUNTER — Other Ambulatory Visit: Payer: Self-pay

## 2019-10-24 DIAGNOSIS — R7301 Impaired fasting glucose: Secondary | ICD-10-CM

## 2019-10-24 DIAGNOSIS — R7303 Prediabetes: Secondary | ICD-10-CM

## 2019-10-24 DIAGNOSIS — E782 Mixed hyperlipidemia: Secondary | ICD-10-CM

## 2019-10-24 LAB — HEMOGLOBIN A1C: Hgb A1c MFr Bld: 6.3 % (ref 4.6–6.5)

## 2019-11-15 DIAGNOSIS — H40011 Open angle with borderline findings, low risk, right eye: Secondary | ICD-10-CM | POA: Diagnosis not present

## 2019-11-15 DIAGNOSIS — H25812 Combined forms of age-related cataract, left eye: Secondary | ICD-10-CM | POA: Diagnosis not present

## 2019-11-15 DIAGNOSIS — H40012 Open angle with borderline findings, low risk, left eye: Secondary | ICD-10-CM | POA: Diagnosis not present

## 2019-11-15 DIAGNOSIS — H25811 Combined forms of age-related cataract, right eye: Secondary | ICD-10-CM | POA: Diagnosis not present

## 2019-12-06 ENCOUNTER — Ambulatory Visit
Admission: RE | Admit: 2019-12-06 | Discharge: 2019-12-06 | Disposition: A | Discharge status: Home / Self Care | Payer: BC Managed Care – PPO | Source: Ambulatory Visit | Attending: Family Medicine | Admitting: Family Medicine

## 2019-12-06 ENCOUNTER — Other Ambulatory Visit: Payer: Self-pay

## 2019-12-06 DIAGNOSIS — Z1231 Encounter for screening mammogram for malignant neoplasm of breast: Secondary | ICD-10-CM

## 2019-12-12 DIAGNOSIS — H57813 Brow ptosis, bilateral: Secondary | ICD-10-CM | POA: Diagnosis not present

## 2019-12-21 DIAGNOSIS — H538 Other visual disturbances: Secondary | ICD-10-CM | POA: Diagnosis not present

## 2019-12-21 DIAGNOSIS — H43811 Vitreous degeneration, right eye: Secondary | ICD-10-CM | POA: Diagnosis not present

## 2019-12-21 DIAGNOSIS — H40003 Preglaucoma, unspecified, bilateral: Secondary | ICD-10-CM | POA: Diagnosis not present

## 2019-12-21 DIAGNOSIS — H02823 Cysts of right eye, unspecified eyelid: Secondary | ICD-10-CM | POA: Diagnosis not present

## 2020-01-04 DIAGNOSIS — H25013 Cortical age-related cataract, bilateral: Secondary | ICD-10-CM | POA: Diagnosis not present

## 2020-01-04 DIAGNOSIS — H40013 Open angle with borderline findings, low risk, bilateral: Secondary | ICD-10-CM | POA: Diagnosis not present

## 2020-01-04 DIAGNOSIS — H18413 Arcus senilis, bilateral: Secondary | ICD-10-CM | POA: Diagnosis not present

## 2020-01-04 DIAGNOSIS — H2513 Age-related nuclear cataract, bilateral: Secondary | ICD-10-CM | POA: Diagnosis not present

## 2020-01-04 DIAGNOSIS — H2511 Age-related nuclear cataract, right eye: Secondary | ICD-10-CM | POA: Diagnosis not present

## 2020-01-12 DIAGNOSIS — H2513 Age-related nuclear cataract, bilateral: Secondary | ICD-10-CM | POA: Diagnosis not present

## 2020-01-12 DIAGNOSIS — H2511 Age-related nuclear cataract, right eye: Secondary | ICD-10-CM | POA: Diagnosis not present

## 2020-02-23 DIAGNOSIS — H2512 Age-related nuclear cataract, left eye: Secondary | ICD-10-CM | POA: Diagnosis not present

## 2020-02-23 DIAGNOSIS — H2513 Age-related nuclear cataract, bilateral: Secondary | ICD-10-CM | POA: Diagnosis not present

## 2020-03-25 DIAGNOSIS — W57XXXA Bitten or stung by nonvenomous insect and other nonvenomous arthropods, initial encounter: Secondary | ICD-10-CM | POA: Diagnosis not present

## 2020-03-25 DIAGNOSIS — T07XXXA Unspecified multiple injuries, initial encounter: Secondary | ICD-10-CM | POA: Diagnosis not present

## 2020-04-22 ENCOUNTER — Ambulatory Visit: Payer: BC Managed Care – PPO

## 2020-04-23 ENCOUNTER — Other Ambulatory Visit: Payer: Self-pay

## 2020-04-23 ENCOUNTER — Ambulatory Visit (INDEPENDENT_AMBULATORY_CARE_PROVIDER_SITE_OTHER): Payer: BC Managed Care – PPO | Admitting: Family Medicine

## 2020-04-23 DIAGNOSIS — R7303 Prediabetes: Secondary | ICD-10-CM

## 2020-04-23 DIAGNOSIS — E782 Mixed hyperlipidemia: Secondary | ICD-10-CM

## 2020-04-23 LAB — BASIC METABOLIC PANEL
BUN: 13 mg/dL (ref 6–23)
CO2: 27 mEq/L (ref 19–32)
Calcium: 9.8 mg/dL (ref 8.4–10.5)
Chloride: 103 mEq/L (ref 96–112)
Creatinine, Ser: 0.96 mg/dL (ref 0.40–1.20)
GFR: 59.24 mL/min — ABNORMAL LOW (ref >60.00)
Glucose, Bld: 120 mg/dL — ABNORMAL HIGH (ref 70–99)
Potassium: 4.7 mEq/L (ref 3.5–5.1)
Sodium: 136 mEq/L (ref 135–145)

## 2020-04-23 LAB — LIPID PANEL
Cholesterol: 261 mg/dL — ABNORMAL HIGH (ref 0–200)
HDL: 52.8 mg/dL (ref >39.00)
LDL Cholesterol: 173 mg/dL — ABNORMAL HIGH (ref 0–99)
NonHDL: 208.45
Total CHOL/HDL Ratio: 5
Triglycerides: 179 mg/dL — ABNORMAL HIGH (ref 0.0–149.0)
VLDL: 35.8 mg/dL (ref 0.0–40.0)

## 2020-04-23 LAB — HEMOGLOBIN A1C: Hgb A1c MFr Bld: 7 % — ABNORMAL HIGH (ref 4.6–6.5)

## 2020-04-24 ENCOUNTER — Encounter: Payer: Self-pay | Admitting: Family Medicine

## 2020-05-08 ENCOUNTER — Other Ambulatory Visit: Payer: Self-pay

## 2020-05-10 ENCOUNTER — Other Ambulatory Visit: Payer: Self-pay

## 2020-05-10 ENCOUNTER — Ambulatory Visit: Payer: BC Managed Care – PPO | Admitting: Family Medicine

## 2020-05-10 ENCOUNTER — Encounter: Payer: Self-pay | Admitting: Family Medicine

## 2020-05-10 VITALS — BP 115/80 | HR 71 | Temp 98.5°F | Resp 16 | Ht 65.0 in | Wt 177.8 lb

## 2020-05-10 DIAGNOSIS — E7800 Pure hypercholesterolemia, unspecified: Secondary | ICD-10-CM | POA: Diagnosis not present

## 2020-05-10 DIAGNOSIS — E119 Type 2 diabetes mellitus without complications: Secondary | ICD-10-CM

## 2020-05-10 NOTE — Progress Notes (Signed)
OFFICE VISIT  05/10/2020   CC:  Chief Complaint  Patient presents with  . Discuss results, plan   HPI:    Patient is a 59 y.o. Caucasian female who presents for "discuss labs, plan". Labs 04/23/20 showed A1c up to 7%---now DM 2. Also lipids up to the point of needing medication (LDL 173 + now w/dx of DM). I recommended she make appt with me to discuss results and future plans since I had not seen her since She does also have hx of NASH, CRI II/III.  She does not want to take any meds for DM or chol.  She admits she has a horrible diet. Wants to try an herbal med called Qivana. She says her whole family has DM and she does have a great grasp of how to eat a diabetic diet.  HLD: says remote hx of taking two different kinds of statins caused excessive muscle aches so she didn't take them for more than a month. She does NO exercise.  She is not sedentary.  Past Medical History:  Diagnosis Date  . BPPV (benign paroxysmal positional vertigo)    Vestib rehab ref-->07/15/18  . Chronic headaches   . Chronic renal insufficiency, stage 2 (mild)    borderlined II/III (GF@59 )  . Depression   . Diabetes mellitus ([place]) 10/2019 prediab; 04/2020 DM 2   Fasting gluc low 120s, Hba1c 6.3%. June 2021 A1c 7%  . History of adenomatous polyp of colon 03/09/2014   tubular adenoma 2011   . Hyperlipidemia 10/2019   10 yr [place] CV risk= 3.5%->TLC.  LDL 170s + new dx DM 04/2020->recommended statin  . Migraine   . NASH (nonalcoholic steatohepatitis) 03/21/2010   Qualifier: Diagnosis of  By: [name] MD, [name]   . OSA (obstructive sleep apnea) 10/14/2012   Sleep study completed on 09/22/2012-interpreting physician Clinton D. Young MD  Impression: 1. 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 2. Moderate scarring with oxygen saturations were -87% with mean saturation to the study night at 92% on room air. 3. Regular cardiac rhythm and the heart rate is 60 beats per minute.   Marland Kitchen RMSF [facility] spotted fever) 2017/18   Lyme neg  . Tobacco dependence    switched to vap 2017    Past Surgical History:  Procedure Laterality Date  . ABDOMINAL HYSTERECTOMY  2010   [name]  . CHOLECYSTECTOMY N/A 03/10/2014   Procedure: LAPAROSCOPIC CHOLECYSTECTOMY WITH INTRAOPERATIVE CHOLANGIOGRAM;  Surgeon: [name], MD;  Location: [facility];  Service: General;  Laterality: N/A;  . COLONOSCOPY W/ BIOPSIES  2011; 03/2014   Dr [name] 2011.  [name] 2015 (polyps-->recall 5 yrs).  . ESOPHAGOGASTRODUODENOSCOPY  2011   Dr [name]  . LAPAROSCOPIC APPENDECTOMY  2009   [name]  . TEMPOROMANDIBULAR JOINT ARTHROPLASTY    . TEMPOROMANDIBULAR JOINT SURGERY    . TONSILLECTOMY      Outpatient Medications Prior to Visit  Medication Sig Dispense Refill  . promethazine (PHENERGAN) 12.5 MG tablet 1-2 tabs po q6h prn dizziness and nausea (Patient not taking: Reported on 01/13/2019) 30 tablet 1  . BESIVANCE 0.6 % SUSP Place 1 drop into the right eye 3 (three) times daily. (Patient not taking: Reported on 05/10/2020)    . DUREZOL 0.05 % EMUL Place 1 drop into the right eye 3 (three) times daily. (Patient not taking: Reported on 05/10/2020)    . hydrocortisone (ANUSOL-HC) 2.5 % rectal cream Place 1 application rectally 2 (two) times  daily. (Patient not taking: Reported on 05/10/2020) 30 g 1  . nystatin cream (MYCOSTATIN) Apply 1 application topically 2 (two) times daily. (Patient not taking: Reported on 05/10/2020) 30 g 0  . PROLENSA 0.07 % SOLN SMARTSIG:1 Drop(s) Right Eye Every Evening (Patient not taking: Reported on 05/10/2020)     No facility-administered medications prior to visit.    Allergies  Allergen Reactions  . Amoxicillin-Pot Clavulanate Hives, Rash, Other (See Comments) and Hypertension    Extreme headache Has patient had a PCN reaction causing immediate rash, facial/tongue/throat swelling, SOB or lightheadedness with hypotension: Yes Has patient had a PCN reaction causing severe rash  involving mucus membranes or skin necrosis: Yes  Has patient had a PCN reaction that required hospitalization No Has patient had a PCN reaction occurring within the last 10 years: Yes - 2010 If all of the above answers are "NO", then may proceed with Cephalosporin use.   . Bee Venom Swelling    Swelling at the site   . Shellfish Allergy Swelling    joints    ROS As per HPI  PE: Blood pressure 115/80, pulse 71, temperature 98.5 F (36.9 C), temperature source Temporal, resp. rate 16, height 5' 5"  (1.651 m), weight 177 lb 12.8 oz (80.6 kg), SpO2 96 %. Body mass index is 29.59 kg/m.  Gen: Alert, well appearing.  Patient is oriented to person, place, time, and situation. AFFECT: pleasant, lucid thought and speech. No further exam today.  LABS:  Lab Results  Component Value Date   TSH 1.14 10/18/2019   Lab Results  Component Value Date   WBC 7.4 10/18/2019   HGB 14.1 10/18/2019   HCT 42.5 10/18/2019   MCV 93.7 10/18/2019   PLT 251.0 10/18/2019   Lab Results  Component Value Date   CREATININE 0.96 04/23/2020   BUN 13 04/23/2020   NA 136 04/23/2020   K 4.7 04/23/2020   CL 103 04/23/2020   CO2 27 04/23/2020   Lab Results  Component Value Date   ALT 29 10/18/2019   AST 21 10/18/2019   ALKPHOS 60 10/18/2019   BILITOT 0.5 10/18/2019   Lab Results  Component Value Date   CHOL 261 (H) 04/23/2020   Lab Results  Component Value Date   HDL 52.80 04/23/2020   Lab Results  Component Value Date   LDLCALC 173 (H) 04/23/2020   Lab Results  Component Value Date   TRIG 179.0 (H) 04/23/2020   Lab Results  Component Value Date   CHOLHDL 5 04/23/2020   Lab Results  Component Value Date   HGBA1C 7.0 (H) 04/23/2020   IMPRESSION AND PLAN:  1) DM, new dx. Pt resistant to the idea of trying diabetic med at this time. She wants to try TLC and an herbal called Qivana. We'll recheck A1c in 6 mo and if not improved she is willing to start a medication (metformin).  2)  HLD: she had myalgias on 2 different statins but she doesn't recall which ones. She told me today she does not want to try another cholesterol med-ever.  Spent 25 min with pt today, with >50% of this time spent in counseling and care coordination regarding the above problems.  An After Visit Summary was printed and given to the patient.  FOLLOW UP: Return in about 6 months (around 11/10/2020) for annual CPE (fasting).  Signed:  Crissie Sickles, MD           05/10/2020

## 2020-05-15 DIAGNOSIS — S90464A Insect bite (nonvenomous), right lesser toe(s), initial encounter: Secondary | ICD-10-CM | POA: Diagnosis not present

## 2020-07-09 ENCOUNTER — Telehealth: Payer: Self-pay

## 2020-07-09 NOTE — Telephone Encounter (Signed)
Patient went to the fire department yesterday due to feeling tired, left arm numb, heart pounding. Patient's bp was 156/96.

## 2020-07-09 NOTE — Telephone Encounter (Addendum)
Sent as FYI.  Patient originally refused to be transferred to be triage nurse.

## 2020-07-10 HISTORY — PX: OTHER SURGICAL HISTORY: SHX169

## 2020-07-10 NOTE — Telephone Encounter (Signed)
4 oclock this Friday, MUST be in-person

## 2020-07-10 NOTE — Telephone Encounter (Signed)
Spoke with patient and advised she could be seen but only option is in person visit. Appt scheduled for 4:15 Friday

## 2020-07-10 NOTE — Telephone Encounter (Signed)
Please advise if able to add on or work in for this week, thanks.

## 2020-07-10 NOTE — Telephone Encounter (Signed)
Pt calling back because she has not heard from our office to advise her further. Wants to know if she needs an appt or labs etc. I told her that we could not give her any addition info than the fire department did because we had not seen her and because was not agreeable to speak with the triage nurse. Is asking for an appointment this week if possible.

## 2020-07-10 NOTE — Telephone Encounter (Signed)
Noted  

## 2020-07-12 ENCOUNTER — Other Ambulatory Visit: Payer: Self-pay

## 2020-07-12 ENCOUNTER — Encounter: Payer: Self-pay | Admitting: Family Medicine

## 2020-07-12 ENCOUNTER — Ambulatory Visit (INDEPENDENT_AMBULATORY_CARE_PROVIDER_SITE_OTHER): Payer: BC Managed Care – PPO | Admitting: Family Medicine

## 2020-07-12 VITALS — BP 121/76 | HR 86 | Temp 98.0°F | Resp 16 | Wt 176.2 lb

## 2020-07-12 DIAGNOSIS — R200 Anesthesia of skin: Secondary | ICD-10-CM

## 2020-07-12 DIAGNOSIS — G459 Transient cerebral ischemic attack, unspecified: Secondary | ICD-10-CM

## 2020-07-12 DIAGNOSIS — R002 Palpitations: Secondary | ICD-10-CM

## 2020-07-12 DIAGNOSIS — R5383 Other fatigue: Secondary | ICD-10-CM | POA: Diagnosis not present

## 2020-07-12 NOTE — Progress Notes (Signed)
OFFICE VISIT  07/12/2020  CC:  Chief Complaint  Patient presents with  . Fatigue  . Numbness  . Tachycardia   HPI:    Patient is a 59 y.o. Caucasian female who presents for "feeling tired, left arm numb, heart pounding".  On 07/08/20 (5 days ago), while working at her computer at home she acutely started to feel signif generalized weakness and layed back on couch and her entire L arm felt numb, felt heart racing.  Arm sensation returned after about 3-5 min, low energy continued. Denies any focal weakness or dysarthria. No CP, no HA, no dizziness, no nausea.  No arm or jaw pain.  No fluttering sensation in chest was felt--just felt sensation of fast/forceful heartbeat.  No vision or hearing changes. She went to the fire dept and they checked her bp --it was 168/98, pulse ranged 51-64. Half hour later, bp checked again and she was told it was higher. This scared her and made her look pale so EMS was called.  EMS did EKG which she brought today and I reviewed independently. BP 135/110 when EMS checked. EKG showed sinus rhythm with rate about 70, neg T waves V1-V3, no ST segment elev or dep, no Q waves.  Normal intervals and duration.  No ectopy.  She declined to go to the ED.   Since that time her fatigue continued, no focal weakness and no return of any numbness.  Has had ongoing feeling of heart beating fast but not as fast as initially.  Sometimes has to take a deep breath but NOT SOB or DOE.  NO LE swelling. No excessive caffeine, no otc decong.  Has taken a supplement called Trimstix several times a day lately.  She had been taking this on/off for a few years but more lately.  2013 sleep study with mild OSA. No EKGs in EMR No neck imaging in EMR.  MRI brain w w/o contrast 09/13/19: IMPRESSION: 1. No evidence of acute intracranial abnormality. 2. Scattered signal changes within the cerebral white matter are somewhat advanced for age and nonspecific, but likely reflect chronic small vessel  ischemic disease. 3. Small left mastoid effusion.  ROS: as above in HPI, plus->ROS: no fevers,no wheezing, no cough, no rashes, no melena/hematochezia.  No polyuria or polydipsia.  No myalgias or arthralgias. No tremors.  No confusion.  No n/v/d or abd pain.  No loss of bowel/bladder control.    Past Medical History:  Diagnosis Date  . BPPV (benign paroxysmal positional vertigo)    Vestib rehab ref-->07/15/18  . Chronic headaches   . Chronic renal insufficiency, stage 2 (mild)    borderlined II/III (GF@59 )  . Depression   . Diabetes mellitus ([place]) 10/2019 prediab; 04/2020 DM 2   Fasting gluc low 120s, Hba1c 6.3%. June 2021 A1c 7%  . History of adenomatous polyp of colon 03/09/2014   tubular adenoma 2011   . Hyperlipidemia 10/2019   10 yr [place] CV risk= 3.5%->TLC.  LDL 170s + new dx DM 04/2020->recommended statin  . Migraine   . NASH (nonalcoholic steatohepatitis) 03/21/2010   Qualifier: Diagnosis of  By: [name] MD, [name]   . OSA (obstructive sleep apnea) 10/14/2012   Sleep study completed on 09/22/2012-interpreting physician Clinton D. Young MD  Impression: 1. 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 2. Moderate scarring with oxygen saturations were -87% with mean saturation to the study night at 92% on room air. 3. Regular cardiac rhythm and the heart rate is 60 beats per  minute.   Marland Kitchen RMSF [facility] spotted fever) 2017/18   Lyme neg  . Tobacco dependence    switched to vap 2017    Past Surgical History:  Procedure Laterality Date  . ABDOMINAL HYSTERECTOMY  2010   [name]  . CHOLECYSTECTOMY N/A 03/10/2014   Procedure: LAPAROSCOPIC CHOLECYSTECTOMY WITH INTRAOPERATIVE CHOLANGIOGRAM;  Surgeon: [name], MD;  Location: [facility];  Service: General;  Laterality: N/A;  . COLONOSCOPY W/ BIOPSIES  2011; 03/2014   Dr [name] 2011.  [name] 2015 (polyps-->recall 5 yrs).  . ESOPHAGOGASTRODUODENOSCOPY  2011   Dr [name]  . LAPAROSCOPIC APPENDECTOMY  2009   Dr  Dennis Bast  . TEMPOROMANDIBULAR JOINT ARTHROPLASTY    . TEMPOROMANDIBULAR JOINT SURGERY    . TONSILLECTOMY      Outpatient Medications Prior to Visit  Medication Sig Dispense Refill  . promethazine (PHENERGAN) 12.5 MG tablet 1-2 tabs po q6h prn dizziness and nausea 30 tablet 1   No facility-administered medications prior to visit.    Allergies  Allergen Reactions  . Amoxicillin-Pot Clavulanate Hives, Rash, Other (See Comments) and Hypertension    Extreme headache Has patient had a PCN reaction causing immediate rash, facial/tongue/throat swelling, SOB or lightheadedness with hypotension: Yes Has patient had a PCN reaction causing severe rash involving mucus membranes or skin necrosis: Yes  Has patient had a PCN reaction that required hospitalization No Has patient had a PCN reaction occurring within the last 10 years: Yes - 2010 If all of the above answers are "NO", then may proceed with Cephalosporin use.   . Bee Venom Swelling    Swelling at the site   . Shellfish Allergy Swelling    joints    ROS As per HPI  PE: Vitals with BMI 07/12/2020 05/10/2020 10/16/2019  Height - 5' 5"  5' 5"   Weight 176 lbs 3 oz 177 lbs 13 oz 171 lbs 10 oz  BMI - 10.62 69.48  Systolic 546 270 350  Diastolic 76 80 78  Pulse 86 71 67   Gen: Alert, well appearing.  Patient is oriented to person, place, time, and situation. AFFECT: pleasant, lucid thought and speech. KXF:GHWE: no injection, icteris, swelling, or exudate.  EOMI, PERRLA. Mouth: lips without lesion/swelling.  Oral mucosa pink and moist. Oropharynx without erythema, exudate, or swelling.  Neck - No masses or thyromegaly or limitation in range of motion CV: RRR, no m/r/g.   LUNGS: CTA bilat, nonlabored resps, good aeration in all lung fields. EXT: no clubbing or cyanosis.  no edema.  Neuro: CN 2-12 intact bilaterally, strength 5/5 in proximal and distal upper extremities and lower extremities bilaterally.  No sensory deficits.  No tremor.   No disdiadochokinesis.  No ataxia.  Upper extremity and lower extremity DTRs symmetric.  No pronator drift. NO nystagmus.  LABS:  Lab Results  Component Value Date   TSH 1.14 10/18/2019   Lab Results  Component Value Date   WBC 7.4 10/18/2019   HGB 14.1 10/18/2019   HCT 42.5 10/18/2019   MCV 93.7 10/18/2019   PLT 251.0 10/18/2019   Lab Results  Component Value Date   CREATININE 0.96 04/23/2020   BUN 13 04/23/2020   NA 136 04/23/2020   K 4.7 04/23/2020   CL 103 04/23/2020   CO2 27 04/23/2020   Lab Results  Component Value Date   ALT 29 10/18/2019   AST 21 10/18/2019   ALKPHOS 60 10/18/2019   BILITOT 0.5 10/18/2019   Lab Results  Component  Value Date   CHOL 261 (H) 04/23/2020   Lab Results  Component Value Date   HDL 52.80 04/23/2020   Lab Results  Component Value Date   LDLCALC 173 (H) 04/23/2020   Lab Results  Component Value Date   TRIG 179.0 (H) 04/23/2020   Lab Results  Component Value Date   CHOLHDL 5 04/23/2020   Lab Results  Component Value Date   HGBA1C 7.0 (H) 04/23/2020   12 lead EKG today: NSR, rate 65, TWI V1-V3, low voltage inferior and lateral leads, no ST segment elevation or depression, no ectopy.  Duration and intervals normal. No change compared to EKG done by EMS on 07/08/20.  IMPRESSION AND PLAN:  1) Acute fatigue, brief L arm numbness: possible TIA. BP elevation could have preceded this or could have been a physiologic response to the TIA. Will obtain 48H holter, echo, and carotid dopplers. Start 162 mg ASA qd. If all normal then will ask neurology to see her and see if possibly MRA brain is needed.  2) Palpitations/forceful and rapid feeling heartbeat: HR's and EKGs have been unremarkable. 48H holter ordered.  3) Elevated BP: these normalized after the initial day of symptoms.  Check CBC w/diff, CMET, TSH, mag, and PT/INR.  Spent 50 min with pt today reviewing HPI, reviewing relevant past history, doing exam, reviewing and  discussing lab and imaging data, and formulating plans.  An After Visit Summary was printed and given to the patient.  FOLLOW UP: Return in about 3 weeks (around 08/02/2020) for f/u TIA/palpitations.  Signed:  Crissie Sickles, MD           07/12/2020

## 2020-07-13 LAB — CBC WITH DIFFERENTIAL/PLATELET
Absolute Monocytes: 439 cells/uL (ref 200–950)
Basophils Absolute: 52 cells/uL (ref 0–200)
Basophils Relative: 0.6 %
Eosinophils Absolute: 129 cells/uL (ref 15–500)
Eosinophils Relative: 1.5 %
HCT: 40.2 % (ref 35.0–45.0)
Hemoglobin: 13.6 g/dL (ref 11.7–15.5)
Lymphs Abs: 3586 cells/uL (ref 850–3900)
MCH: 30.8 pg (ref 27.0–33.0)
MCHC: 33.8 g/dL (ref 32.0–36.0)
MCV: 91 fL (ref 80.0–100.0)
MPV: 12.2 fL (ref 7.5–12.5)
Monocytes Relative: 5.1 %
Neutro Abs: 4395 cells/uL (ref 1500–7800)
Neutrophils Relative %: 51.1 %
Platelets: 207 10*3/uL (ref 140–400)
RBC: 4.42 10*6/uL (ref 3.80–5.10)
RDW: 12.6 % (ref 11.0–15.0)
Total Lymphocyte: 41.7 %
WBC: 8.6 10*3/uL (ref 3.8–10.8)

## 2020-07-13 LAB — MAGNESIUM: Magnesium: 2.1 mg/dL (ref 1.5–2.5)

## 2020-07-13 LAB — COMPREHENSIVE METABOLIC PANEL
AG Ratio: 1.5 (calc) (ref 1.0–2.5)
ALT: 24 U/L (ref 6–29)
AST: 20 U/L (ref 10–35)
Albumin: 4.4 g/dL (ref 3.6–5.1)
Alkaline phosphatase (APISO): 57 U/L (ref 37–153)
BUN/Creatinine Ratio: 18 (calc) (ref 6–22)
BUN: 19 mg/dL (ref 7–25)
CO2: 21 mmol/L (ref 20–32)
Calcium: 9.8 mg/dL (ref 8.6–10.4)
Chloride: 106 mmol/L (ref 98–110)
Creat: 1.08 mg/dL — ABNORMAL HIGH (ref 0.50–0.99)
Globulin: 3 g/dL (calc) (ref 1.9–3.7)
Glucose, Bld: 97 mg/dL (ref 65–99)
Potassium: 4.2 mmol/L (ref 3.5–5.3)
Sodium: 138 mmol/L (ref 135–146)
Total Bilirubin: 0.3 mg/dL (ref 0.2–1.2)
Total Protein: 7.4 g/dL (ref 6.1–8.1)

## 2020-07-13 LAB — PROTIME-INR
INR: 0.9
Prothrombin Time: 9.8 s (ref 9.0–11.5)

## 2020-07-13 LAB — TSH: TSH: 1.02 mIU/L (ref 0.40–4.50)

## 2020-07-18 ENCOUNTER — Telehealth: Payer: Self-pay | Admitting: General Practice

## 2020-07-18 ENCOUNTER — Telehealth: Payer: Self-pay

## 2020-07-18 NOTE — Telephone Encounter (Signed)
We'll send a message with results of testing when we get them. Reassure her that she is healthy enough to go on her trip.  Tell her to go and enjoy herself and not worry about any of her recent symptoms!

## 2020-07-18 NOTE — Telephone Encounter (Signed)
Returned call to patient and answered all her questions.

## 2020-07-18 NOTE — Telephone Encounter (Signed)
Patient calling about heart monitor. She states she does not have mychart.

## 2020-07-18 NOTE — Telephone Encounter (Signed)
Patient is supposed to be leaving to go to [place], [place] on Friday 9/79     Patient wants to know if she is healthy enough to go on her trip.  Patient is scheduled on Wed 9/15 for Ultrasound for what has happened hr recently, and liked to scare her death, she said.  She is requesting if results are bad enough for her not to go on her trip will Dr. Anitra Lauth please call her as soon as he has results available to him.   Please call [phone].

## 2020-07-18 NOTE — Telephone Encounter (Signed)
FYI  Please see below

## 2020-07-19 NOTE — Telephone Encounter (Signed)
Patient advised and voiced understanding.  

## 2020-07-19 NOTE — Telephone Encounter (Signed)
LM for pt to returncall

## 2020-07-22 DIAGNOSIS — M9903 Segmental and somatic dysfunction of lumbar region: Secondary | ICD-10-CM | POA: Diagnosis not present

## 2020-07-22 DIAGNOSIS — M5432 Sciatica, left side: Secondary | ICD-10-CM | POA: Diagnosis not present

## 2020-07-23 DIAGNOSIS — M5432 Sciatica, left side: Secondary | ICD-10-CM | POA: Diagnosis not present

## 2020-07-23 DIAGNOSIS — M9903 Segmental and somatic dysfunction of lumbar region: Secondary | ICD-10-CM | POA: Diagnosis not present

## 2020-07-24 ENCOUNTER — Other Ambulatory Visit (INDEPENDENT_AMBULATORY_CARE_PROVIDER_SITE_OTHER): Payer: BC Managed Care – PPO

## 2020-07-24 ENCOUNTER — Ambulatory Visit
Admission: RE | Admit: 2020-07-24 | Discharge: 2020-07-24 | Disposition: A | Discharge status: Home / Self Care | Payer: BC Managed Care – PPO | Source: Ambulatory Visit | Attending: Family Medicine | Admitting: Family Medicine

## 2020-07-24 DIAGNOSIS — G459 Transient cerebral ischemic attack, unspecified: Secondary | ICD-10-CM

## 2020-07-24 DIAGNOSIS — R200 Anesthesia of skin: Secondary | ICD-10-CM | POA: Diagnosis not present

## 2020-07-24 DIAGNOSIS — Z20822 Contact with and (suspected) exposure to covid-19: Secondary | ICD-10-CM | POA: Diagnosis not present

## 2020-07-24 DIAGNOSIS — I6523 Occlusion and stenosis of bilateral carotid arteries: Secondary | ICD-10-CM | POA: Diagnosis not present

## 2020-07-24 DIAGNOSIS — M9903 Segmental and somatic dysfunction of lumbar region: Secondary | ICD-10-CM | POA: Diagnosis not present

## 2020-07-24 DIAGNOSIS — Z03818 Encounter for observation for suspected exposure to other biological agents ruled out: Secondary | ICD-10-CM | POA: Diagnosis not present

## 2020-07-24 DIAGNOSIS — R002 Palpitations: Secondary | ICD-10-CM

## 2020-07-24 DIAGNOSIS — M5432 Sciatica, left side: Secondary | ICD-10-CM | POA: Diagnosis not present

## 2020-07-24 HISTORY — PX: OTHER SURGICAL HISTORY: SHX169

## 2020-07-25 ENCOUNTER — Encounter: Payer: Self-pay | Admitting: Family Medicine

## 2020-07-25 DIAGNOSIS — M9903 Segmental and somatic dysfunction of lumbar region: Secondary | ICD-10-CM | POA: Diagnosis not present

## 2020-07-25 DIAGNOSIS — M5432 Sciatica, left side: Secondary | ICD-10-CM | POA: Diagnosis not present

## 2020-08-04 ENCOUNTER — Encounter: Payer: Self-pay | Admitting: Family Medicine

## 2020-08-05 DIAGNOSIS — M5432 Sciatica, left side: Secondary | ICD-10-CM | POA: Diagnosis not present

## 2020-08-05 DIAGNOSIS — M9903 Segmental and somatic dysfunction of lumbar region: Secondary | ICD-10-CM | POA: Diagnosis not present

## 2020-08-07 DIAGNOSIS — M9903 Segmental and somatic dysfunction of lumbar region: Secondary | ICD-10-CM | POA: Diagnosis not present

## 2020-08-07 DIAGNOSIS — M5432 Sciatica, left side: Secondary | ICD-10-CM | POA: Diagnosis not present

## 2020-08-09 ENCOUNTER — Other Ambulatory Visit: Payer: Self-pay

## 2020-08-09 ENCOUNTER — Ambulatory Visit (HOSPITAL_COMMUNITY): Payer: BC Managed Care – PPO | Attending: Cardiology

## 2020-08-09 DIAGNOSIS — R002 Palpitations: Secondary | ICD-10-CM | POA: Insufficient documentation

## 2020-08-09 DIAGNOSIS — R200 Anesthesia of skin: Secondary | ICD-10-CM

## 2020-08-09 DIAGNOSIS — G459 Transient cerebral ischemic attack, unspecified: Secondary | ICD-10-CM | POA: Insufficient documentation

## 2020-08-09 HISTORY — PX: TRANSTHORACIC ECHOCARDIOGRAM: SHX275

## 2020-08-10 LAB — ECHOCARDIOGRAM COMPLETE
Area-P 1/2: 5.54 cm2
S' Lateral: 2.6 cm

## 2020-08-12 ENCOUNTER — Encounter: Payer: Self-pay | Admitting: Family Medicine

## 2020-08-12 DIAGNOSIS — M9903 Segmental and somatic dysfunction of lumbar region: Secondary | ICD-10-CM | POA: Diagnosis not present

## 2020-08-12 DIAGNOSIS — M5432 Sciatica, left side: Secondary | ICD-10-CM | POA: Diagnosis not present

## 2020-08-13 DIAGNOSIS — L814 Other melanin hyperpigmentation: Secondary | ICD-10-CM | POA: Diagnosis not present

## 2020-08-13 DIAGNOSIS — D2261 Melanocytic nevi of right upper limb, including shoulder: Secondary | ICD-10-CM | POA: Diagnosis not present

## 2020-08-13 DIAGNOSIS — D485 Neoplasm of uncertain behavior of skin: Secondary | ICD-10-CM | POA: Diagnosis not present

## 2020-08-13 DIAGNOSIS — D0439 Carcinoma in situ of skin of other parts of face: Secondary | ICD-10-CM | POA: Diagnosis not present

## 2020-08-13 DIAGNOSIS — L821 Other seborrheic keratosis: Secondary | ICD-10-CM | POA: Diagnosis not present

## 2020-08-13 DIAGNOSIS — D225 Melanocytic nevi of trunk: Secondary | ICD-10-CM | POA: Diagnosis not present

## 2020-08-13 DIAGNOSIS — D2262 Melanocytic nevi of left upper limb, including shoulder: Secondary | ICD-10-CM | POA: Diagnosis not present

## 2020-08-15 ENCOUNTER — Ambulatory Visit (INDEPENDENT_AMBULATORY_CARE_PROVIDER_SITE_OTHER): Payer: BC Managed Care – PPO | Admitting: Family Medicine

## 2020-08-15 ENCOUNTER — Encounter: Payer: Self-pay | Admitting: Family Medicine

## 2020-08-15 ENCOUNTER — Other Ambulatory Visit: Payer: Self-pay

## 2020-08-15 VITALS — BP 89/59 | HR 67 | Temp 98.0°F | Resp 16 | Ht 65.0 in | Wt 178.0 lb

## 2020-08-15 DIAGNOSIS — M9903 Segmental and somatic dysfunction of lumbar region: Secondary | ICD-10-CM | POA: Diagnosis not present

## 2020-08-15 DIAGNOSIS — E119 Type 2 diabetes mellitus without complications: Secondary | ICD-10-CM

## 2020-08-15 DIAGNOSIS — M5432 Sciatica, left side: Secondary | ICD-10-CM | POA: Diagnosis not present

## 2020-08-15 DIAGNOSIS — Z789 Other specified health status: Secondary | ICD-10-CM

## 2020-08-15 DIAGNOSIS — R202 Paresthesia of skin: Secondary | ICD-10-CM | POA: Diagnosis not present

## 2020-08-15 DIAGNOSIS — E7800 Pure hypercholesterolemia, unspecified: Secondary | ICD-10-CM

## 2020-08-15 LAB — POCT GLYCOSYLATED HEMOGLOBIN (HGB A1C)
HbA1c POC (<> result, manual entry): 6 % (ref 4.0–5.6)
HbA1c, POC (controlled diabetic range): 6 % (ref 0.0–7.0)
HbA1c, POC (prediabetic range): 6 % (ref 5.7–6.4)
Hemoglobin A1C: 6 % — AB (ref 4.0–5.6)

## 2020-08-15 NOTE — Progress Notes (Addendum)
OFFICE VISIT  08/15/2020  CC:  Chief Complaint  Patient presents with  . Follow-up    RCI, pt is not fasting    HPI:    Patient is a 59 y.o. Caucasian female who presents for 3 mo f/u DM 2, HLD, CRI II/III, NAFLD. A/P as of last RCI visit: "1) DM, new dx. Pt resistant to the idea of trying diabetic med at this time. She wants to try TLC and an herbal called Qivana. We'll recheck A1c in 6 mo and if not improved she is willing to start a medication (metformin).  2) HLD: she had myalgias on 2 different statins but she doesn't recall which ones. She told me today she does not want to try another cholesterol med-ever."  INTERIM HX: No more episodes of paresthesias with elevated bp and racing heart since I saw her 07/12/20.  Rhythm monitoring, carotids, and echo all normal. Home bp monitoring 120/70 avg, HR ranging 42-82.  She has hx of chronic HAs, possibly a bit worse the last couple months but she's not sure, location is behind both eyes.  Stress in her life quite high. She does not want to get covid vaccine but she says her employer is going to mandate it so she'll likely lose her job.  She has made some effort to eat better diet.  She stopped soda.  Much less eating out (using "hello fresh"). Not much formal exercise, says her back prevents this.  Does some walking and stretching.  Past Medical History:  Diagnosis Date  . BPPV (benign paroxysmal positional vertigo)    [facility] rehab ref-->07/15/18  . Chronic headaches   . Chronic renal insufficiency, stage 2 (mild)    borderlined II/III (GF@59 )  . Depression   . Diabetes mellitus ([place]) 10/2019 prediab; 04/2020 DM 2   Fasting gluc low 120s, Hba1c 6.3%. June 2021 A1c 7%  . History of adenomatous polyp of colon 03/09/2014   tubular adenoma 2011   . Hyperlipidemia 10/2019   10 yr [place] CV risk= 3.5%->TLC.  LDL 170s + new dx DM 04/2020->pt declines any statin b/c of myalgis on 2 diff ones.  . Migraine   . NASH (nonalcoholic  steatohepatitis) 03/21/2010   Qualifier: Diagnosis of  By: [name] MD, [name]   . OSA (obstructive sleep apnea) 10/14/2012   Sleep study completed on 09/22/2012-interpreting physician Clinton D. Young MD  Impression: 1. 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 2. Moderate scarring with oxygen saturations were -87% with mean saturation to the study night at 92% on room air. 3. Regular cardiac rhythm and the heart rate is 60 beats per minute.   Marland Kitchen [facility] spotted fever) 2017/18   Lyme neg  . Tobacco dependence    switched to vap 2017    Past Surgical History:  Procedure Laterality Date  . ABDOMINAL HYSTERECTOMY  2010   [name]  . CAROTID DOPPLERS Bilateral 07/24/2020   NORMAL  . CHOLECYSTECTOMY N/A 03/10/2014   Procedure: LAPAROSCOPIC CHOLECYSTECTOMY WITH INTRAOPERATIVE CHOLANGIOGRAM;  Surgeon: [name], MD;  Location: [facility];  Service: General;  Laterality: N/A;  . COLONOSCOPY W/ BIOPSIES  2011; 03/2014   Dr [name] 2011.  [name] 2015 (polyps-->recall 5 yrs).  . ESOPHAGOGASTRODUODENOSCOPY  2011   Dr [name]  . LAPAROSCOPIC APPENDECTOMY  2009   [name]  . RHYTHM MONITORING  07/2020   normal  . TEMPOROMANDIBULAR JOINT ARTHROPLASTY    . TEMPOROMANDIBULAR JOINT SURGERY    .  TONSILLECTOMY    . TRANSTHORACIC ECHOCARDIOGRAM  08/2020   COMPLETELY NORMAL    Outpatient Medications Prior to Visit  Medication Sig Dispense Refill  . aspirin 81 MG chewable tablet Chew 81 mg by mouth daily.    . fluorouracil (EFUDEX) 5 % cream Apply topically. (Patient not taking: Reported on 08/15/2020)    . promethazine (PHENERGAN) 12.5 MG tablet 1-2 tabs po q6h prn dizziness and nausea (Patient not taking: Reported on 08/15/2020) 30 tablet 1   No facility-administered medications prior to visit.    Allergies  Allergen Reactions  . Amoxicillin-Pot Clavulanate Hives, Rash, Other (See Comments) and Hypertension    Extreme headache Has patient had a PCN reaction  causing immediate rash, facial/tongue/throat swelling, SOB or lightheadedness with hypotension: Yes Has patient had a PCN reaction causing severe rash involving mucus membranes or skin necrosis: Yes  Has patient had a PCN reaction that required hospitalization No Has patient had a PCN reaction occurring within the last 10 years: Yes - 2010 If all of the above answers are "NO", then may proceed with Cephalosporin use.   . Bee Venom Swelling    Swelling at the site   . Shellfish Allergy Swelling    joints    ROS As per HPI  PE: Vitals with BMI 08/15/2020 07/12/2020 05/10/2020  Height 5' 5"  - 5' 5"   Weight 178 lbs 176 lbs 3 oz 177 lbs 13 oz  BMI 46.56 - 81.27  Systolic 89 517 001  Diastolic 59 76 80  Pulse 67 86 71     Gen: Alert, well appearing.  Patient is oriented to person, place, time, and situation. AFFECT: pleasant, lucid thought and speech. No further exam today.  LABS:  Lab Results  Component Value Date   TSH 1.02 07/12/2020   Lab Results  Component Value Date   WBC 8.6 07/12/2020   HGB 13.6 07/12/2020   HCT 40.2 07/12/2020   MCV 91.0 07/12/2020   PLT 207 07/12/2020   Lab Results  Component Value Date   CREATININE 1.08 (H) 07/12/2020   BUN 19 07/12/2020   NA 138 07/12/2020   K 4.2 07/12/2020   CL 106 07/12/2020   CO2 21 07/12/2020   Lab Results  Component Value Date   ALT 24 07/12/2020   AST 20 07/12/2020   ALKPHOS 60 10/18/2019   BILITOT 0.3 07/12/2020   Lab Results  Component Value Date   CHOL 261 (H) 04/23/2020   Lab Results  Component Value Date   HDL 52.80 04/23/2020   Lab Results  Component Value Date   LDLCALC 173 (H) 04/23/2020   Lab Results  Component Value Date   TRIG 179.0 (H) 04/23/2020   Lab Results  Component Value Date   CHOLHDL 5 04/23/2020   Lab Results  Component Value Date   HGBA1C 6.0 (A) 08/15/2020   HGBA1C 6.0 08/15/2020   HGBA1C 6.0 08/15/2020   HGBA1C 6.0 08/15/2020   POC HbA1c today is 6.0%  IMPRESSION  AND PLAN:  1) DM 2: recent dx.  Dietary changes made but she is unable to exercise b/c she states she has chronic back pain. A1c improved to 6.0% over the last 4 months! Cont diet/activity mod. No further labs today b/c all good 07/2020.  2) HLD: she did not tolerate 2 statins and she declines any further trials of any cholesterol meds. TLC emphasized.  3) L arm paresthesias: TIA w/u neg/normal. No neuroimaging at this time. I am not convinced this was a  TIA. If recurrent then we'll get MRI/MRA brain to r/o aneurism or signif intracerebral arterial stenosis OR we'll refer to neurology (or both).  She requested a medical note/exemption to excuse her from getting covid vaccine but I told her I could not write one for her.  Spent 32 min with pt today reviewing HPI, reviewing relevant past history, doing exam, reviewing and discussing lab and imaging data, and formulating plans.  An After Visit Summary was printed and given to the patient.  FOLLOW UP: Return for f/u 4-6 mo RCI.  Signed:  Crissie Sickles, MD           08/15/2020

## 2020-08-15 NOTE — Patient Instructions (Addendum)
Evelina Dun, NP Kathreen Devoid, PA Sherron Ales, NP  [facility] [phone]

## 2020-08-21 DIAGNOSIS — M5432 Sciatica, left side: Secondary | ICD-10-CM | POA: Diagnosis not present

## 2020-08-21 DIAGNOSIS — M9903 Segmental and somatic dysfunction of lumbar region: Secondary | ICD-10-CM | POA: Diagnosis not present

## 2020-08-22 DIAGNOSIS — M9903 Segmental and somatic dysfunction of lumbar region: Secondary | ICD-10-CM | POA: Diagnosis not present

## 2020-08-22 DIAGNOSIS — M5432 Sciatica, left side: Secondary | ICD-10-CM | POA: Diagnosis not present

## 2020-09-05 DIAGNOSIS — M5432 Sciatica, left side: Secondary | ICD-10-CM | POA: Diagnosis not present

## 2020-09-05 DIAGNOSIS — M9903 Segmental and somatic dysfunction of lumbar region: Secondary | ICD-10-CM | POA: Diagnosis not present

## 2020-09-12 DIAGNOSIS — M9903 Segmental and somatic dysfunction of lumbar region: Secondary | ICD-10-CM | POA: Diagnosis not present

## 2020-09-12 DIAGNOSIS — M5432 Sciatica, left side: Secondary | ICD-10-CM | POA: Diagnosis not present

## 2020-10-14 DIAGNOSIS — M5432 Sciatica, left side: Secondary | ICD-10-CM | POA: Diagnosis not present

## 2020-10-14 DIAGNOSIS — M9903 Segmental and somatic dysfunction of lumbar region: Secondary | ICD-10-CM | POA: Diagnosis not present

## 2020-11-07 DIAGNOSIS — M9903 Segmental and somatic dysfunction of lumbar region: Secondary | ICD-10-CM | POA: Diagnosis not present

## 2020-11-07 DIAGNOSIS — M5432 Sciatica, left side: Secondary | ICD-10-CM | POA: Diagnosis not present

## 2020-12-04 DIAGNOSIS — M2550 Pain in unspecified joint: Secondary | ICD-10-CM | POA: Diagnosis not present

## 2020-12-04 DIAGNOSIS — E559 Vitamin D deficiency, unspecified: Secondary | ICD-10-CM | POA: Diagnosis not present

## 2020-12-04 DIAGNOSIS — E538 Deficiency of other specified B group vitamins: Secondary | ICD-10-CM | POA: Diagnosis not present

## 2020-12-04 DIAGNOSIS — R5383 Other fatigue: Secondary | ICD-10-CM | POA: Diagnosis not present

## 2020-12-04 DIAGNOSIS — R4189 Other symptoms and signs involving cognitive functions and awareness: Secondary | ICD-10-CM | POA: Diagnosis not present

## 2020-12-04 DIAGNOSIS — Z789 Other specified health status: Secondary | ICD-10-CM | POA: Diagnosis not present

## 2020-12-04 DIAGNOSIS — M791 Myalgia, unspecified site: Secondary | ICD-10-CM | POA: Diagnosis not present

## 2020-12-04 DIAGNOSIS — Z8619 Personal history of other infectious and parasitic diseases: Secondary | ICD-10-CM | POA: Diagnosis not present

## 2020-12-04 LAB — BASIC METABOLIC PANEL
BUN: 14 (ref 4–21)
CO2: 29 — AB (ref 13–22)
Chloride: 104 (ref 99–108)
Creatinine: 0.9 (ref <=1.1)
Glucose: 123
Potassium: 4.7 (ref 3.4–5.3)
Sodium: 137 (ref 137–147)

## 2020-12-04 LAB — IRON,TIBC AND FERRITIN PANEL: Ferritin: 116.3

## 2020-12-04 LAB — VITAMIN D 25 HYDROXY (VIT D DEFICIENCY, FRACTURES): Vit D, 25-Hydroxy: 23.2

## 2020-12-04 LAB — COMPREHENSIVE METABOLIC PANEL
Albumin: 4.6 (ref 3.5–5.0)
Calcium: 9.6 (ref 8.7–10.7)
GFR calc Af Amer: 74
GFR calc non Af Amer: 61

## 2020-12-04 LAB — TSH: TSH: 0.98 (ref 0.41–5.90)

## 2020-12-04 LAB — TESTOSTERONE: Testosterone: 16.49

## 2020-12-04 LAB — HEMOGLOBIN A1C: Hemoglobin A1C: 6.6

## 2020-12-04 LAB — VITAMIN B12: Vitamin B-12: 574

## 2020-12-04 LAB — CBC AND DIFFERENTIAL
HCT: 40 (ref 36–46)
Hemoglobin: 13.7 (ref 12.0–16.0)
Neutrophils Absolute: 5.4
Platelets: 271 (ref 150–399)
WBC: 8.3

## 2020-12-04 LAB — HEPATIC FUNCTION PANEL
ALT: 34 (ref 7–35)
AST: 22 (ref 13–35)
Alkaline Phosphatase: 61 (ref 25–125)
Bilirubin, Total: 0.4

## 2020-12-04 LAB — CBC: RBC: 4.42 (ref 3.87–5.11)

## 2021-01-14 ENCOUNTER — Other Ambulatory Visit: Payer: Self-pay

## 2021-01-14 ENCOUNTER — Telehealth: Payer: Self-pay

## 2021-01-14 ENCOUNTER — Emergency Department (HOSPITAL_BASED_OUTPATIENT_CLINIC_OR_DEPARTMENT_OTHER)
Admission: EM | Admit: 2021-01-14 | Discharge: 2021-01-14 | Disposition: A | Discharge status: Home / Self Care | Payer: BC Managed Care – PPO | Attending: Emergency Medicine | Admitting: Emergency Medicine

## 2021-01-14 DIAGNOSIS — E1169 Type 2 diabetes mellitus with other specified complication: Secondary | ICD-10-CM | POA: Insufficient documentation

## 2021-01-14 DIAGNOSIS — E785 Hyperlipidemia, unspecified: Secondary | ICD-10-CM | POA: Insufficient documentation

## 2021-01-14 DIAGNOSIS — Z87891 Personal history of nicotine dependence: Secondary | ICD-10-CM | POA: Diagnosis not present

## 2021-01-14 DIAGNOSIS — Z96698 Presence of other orthopedic joint implants: Secondary | ICD-10-CM | POA: Insufficient documentation

## 2021-01-14 DIAGNOSIS — R519 Headache, unspecified: Secondary | ICD-10-CM | POA: Diagnosis not present

## 2021-01-14 DIAGNOSIS — Z86010 Personal history of colonic polyps: Secondary | ICD-10-CM | POA: Insufficient documentation

## 2021-01-14 DIAGNOSIS — L739 Follicular disorder, unspecified: Secondary | ICD-10-CM | POA: Diagnosis not present

## 2021-01-14 DIAGNOSIS — R4781 Slurred speech: Secondary | ICD-10-CM | POA: Diagnosis not present

## 2021-01-14 DIAGNOSIS — Z7982 Long term (current) use of aspirin: Secondary | ICD-10-CM | POA: Insufficient documentation

## 2021-01-14 DIAGNOSIS — H532 Diplopia: Secondary | ICD-10-CM | POA: Diagnosis not present

## 2021-01-14 DIAGNOSIS — G8929 Other chronic pain: Secondary | ICD-10-CM | POA: Insufficient documentation

## 2021-01-14 DIAGNOSIS — H52531 Spasm of accommodation, right eye: Secondary | ICD-10-CM | POA: Insufficient documentation

## 2021-01-14 DIAGNOSIS — R9431 Abnormal electrocardiogram [ECG] [EKG]: Secondary | ICD-10-CM | POA: Diagnosis not present

## 2021-01-14 NOTE — ED Provider Notes (Signed)
[facility] EMERGENCY DEPARTMENT Provider Note  CSN: [id number] Arrival date & time: 01/14/21 1756    History Chief Complaint  Patient presents with  . Headache    HPI  [name] is a 59 y.o. female with history of multiple medical problems reports she has had a sensation of her 'brain on fire' since last summer that started when she sustained a small puncture wound to her scalp after bumping her head. She reports she had persistent drainage of pink/clear fluid since then. She had a TIA in September of 2021 that was described as a numbness/tingling on the left side. She saw her PCP who did 'the cardiac part' of the workup but did not do any neuro imaging. His note also mentions that he was not sure her symptoms were really a TIA. She was seen there again in Oct 2021 and his note mentioned Neurology referral and/or MRI/MRA if symptoms persistent but she states 'that didn't happen yet'. She was seen by an [facility] clinic in [place] recently and started on doxycycline for chronic lyme disease and since then the sore on her scalp has crusted over but now she feels like she has a new ridge on her scalp that she thinks might due to her brain swelling and trying to push out of her head. Today she also noted her R eyelid was twitching. She had 2 slurred words during a work meeting earlier this afternoon but no other acute symptoms. She called her PCP's office who recommended she go to ED or UC. She went to a local UC and was subsequently sent to the ED after reporting some double vision. She does not currently have any double vision.    Past Medical History:  Diagnosis Date  . BPPV (benign paroxysmal positional vertigo)    [facility]ib rehab ref-->07/15/18  . Chronic headaches   . Chronic renal insufficiency, stage 2 (mild)    borderlined II/III (GF@59 )  . Depression   . Diabetes mellitus ([place]) 10/2019 prediab; 04/2020 DM 2   Fasting gluc low 120s, Hba1c 6.3%. June 2021  A1c 7%  . History of adenomatous polyp of colon 03/09/2014   tubular adenoma 2011   . Hyperlipidemia 10/2019   10 yr [place] CV risk= 3.5%->TLC.  LDL 170s + new dx DM 04/2020->pt declines any statin b/c of myalgis on 2 diff ones.  . Migraine   . NASH (nonalcoholic steatohepatitis) 03/21/2010   Qualifier: Diagnosis of  By: [name] MD, [name]   . OSA (obstructive sleep apnea) 10/14/2012   Sleep study completed on 09/22/2012-interpreting physician Clinton D. Young MD  Impression: 1. 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 2. Moderate scarring with oxygen saturations were -87% with mean saturation to the study night at 92% on room air. 3. Regular cardiac rhythm and the heart rate is 60 beats per minute.   Marland Kitchen [facility] spotted fever) 2017/18   Lyme neg  . Tobacco dependence    switched to vap 2017    Past Surgical History:  Procedure Laterality Date  . ABDOMINAL HYSTERECTOMY  2010   [name]  . CAROTID DOPPLERS Bilateral 07/24/2020   NORMAL  . CHOLECYSTECTOMY N/A 03/10/2014   Procedure: LAPAROSCOPIC CHOLECYSTECTOMY WITH INTRAOPERATIVE CHOLANGIOGRAM;  Surgeon: [name], MD;  Location: [facility];  Service: General;  Laterality: N/A;  . COLONOSCOPY W/ BIOPSIES  2011; 03/2014   Dr [name] 2011.  [name] 2015 (polyps-->recall 5 yrs).  . ESOPHAGOGASTRODUODENOSCOPY  2011  Dr [name]  . LAPAROSCOPIC APPENDECTOMY  2009   [name]  . RHYTHM MONITORING  07/2020   normal  . TEMPOROMANDIBULAR JOINT ARTHROPLASTY    . TEMPOROMANDIBULAR JOINT SURGERY    . TONSILLECTOMY    . TRANSTHORACIC ECHOCARDIOGRAM  08/2020   COMPLETELY NORMAL    Family History  Problem Relation Age of Onset  . Heart disease Father   . Hypertension Father   . Hemochromatosis Father   . Diabetes Mother   . Diabetes Brother   . Hyperlipidemia Other   . Arthritis Other   . Sudden death Other   . Alcohol abuse Maternal Grandfather     Social History   Tobacco Use  . Smoking  status: Former Smoker    Packs/day: 1.50    Years: 32.00    Pack years: 48.00    Types: Cigarettes    Quit date: 08/01/2016    Years since quitting: 4.4  . Smokeless tobacco: Never Used  Vaping Use  . Vaping Use: Every day  . Start date: 04/09/2016  Substance Use Topics  . Alcohol use: Yes    Alcohol/week: 0.0 standard drinks    Comment: occassional  . Drug use: No     Home Medications Prior to Admission medications   Medication Sig Start Date End Date Taking? Authorizing Provider  aspirin 81 MG chewable tablet Chew 81 mg by mouth daily.    [provider]  fluorouracil (EFUDEX) 5 % cream Apply topically. Patient not taking: Reported on 08/15/2020 08/13/20   [provider]  promethazine (PHENERGAN) 12.5 MG tablet 1-2 tabs po q6h prn dizziness and nausea Patient not taking: Reported on 08/15/2020 07/15/18   Tammi Sou, MD     Allergies    Amoxicillin-pot clavulanate, Bee venom, and Shellfish allergy   Review of Systems   Review of Systems A comprehensive review of systems was completed and negative except as noted in HPI.    Physical Exam BP (!) 152/78 (BP Location: Right Arm)   Pulse (!) 50   Temp 98.6 F (37 C) (Oral)   Resp 18   Wt 85.6 kg   SpO2 100%   BMI 31.40 kg/m   Physical Exam Vitals and nursing note reviewed.  Constitutional:      Appearance: Normal appearance.  HENT:     Head: Normocephalic and atraumatic.     Nose: Nose normal.     Mouth/Throat:     Mouth: Mucous membranes are moist.  Eyes:     Extraocular Movements: Extraocular movements intact.     Conjunctiva/sclera: Conjunctivae normal.  Cardiovascular:     Rate and Rhythm: Normal rate.  Pulmonary:     Effort: Pulmonary effort is normal.     Breath sounds: Normal breath sounds.  Abdominal:     General: Abdomen is flat.     Palpations: Abdomen is soft.     Tenderness: There is no abdominal tenderness.  Musculoskeletal:        General: No swelling. Normal range of  motion.     Cervical back: Neck supple.  Skin:    General: Skin is warm and dry.     Comments: Small area of healing folliculitis on crown of head, no other skin abnormalities, the area she points to as a new "ridge" on her scalp appears to be a normal cranial contour although she states it is new  Neurological:     General: No focal deficit present.     Mental Status: She is  alert and oriented to person, place, and time.     Cranial Nerves: No cranial nerve deficit.     Sensory: No sensory deficit.     Motor: No weakness.  Psychiatric:        Mood and Affect: Mood normal.      ED Results / Procedures / Treatments   Labs (all labs ordered are listed, but only abnormal results are displayed) Labs Reviewed - No data to display  EKG EKG Interpretation  Date/Time:  Tuesday January 14 2021 18:07:45 EST Ventricular Rate:  62 PR Interval:    QRS Duration: 99 QT Interval:  403 QTC Calculation: 410 R Axis:   42 Text Interpretation: Sinus rhythm Low voltage, precordial leads No significant change since last tracing Confirmed by Calvert Cantor [phone]) on 01/14/2021 6:12:28 PM    Radiology No results found.  Procedures Procedures  Medications Ordered in the ED Medications - No data to display   MDM Rules/Calculators/A&P MDM Patient here with chronic symptom of 'brain on fire' but no other focal neuro symptoms in the ED. I doubt that her symptoms earlier today are due to a true TIA but I offered ED evaluation including CT imaging and labs. She would like to defer those tests at this time but would like a referral to Neurology. She is also going to contact her PCP about arranging an outpatient MRI to be done. She was encouraged to return to the ED if her symptoms change in any way.    ED Course  I have reviewed the triage vital signs and the nursing notes.  Pertinent labs & imaging results that were available during my care of the patient were reviewed by me and considered in my  medical decision making (see chart for details).     Final Clinical Impression(s) / ED Diagnoses Final diagnoses:  Chronic nonintractable headache, unspecified headache type    Rx / DC Orders ED Discharge Orders    None       Truddie Hidden, MD 01/14/21 [phone]

## 2021-01-14 NOTE — ED Notes (Signed)
At 1500 was in a meeting when started having terrible "brain fog" and right eyelid started twitching. Finished meeting at [employer] and joined another meeting when she slurred a word. Called PCP at 1630, who informed to go to ER or UC. Went to UC around 1700 who told to come here. When tracking doctors finger, she states she had an episode of double vision.   During assessment, pt noted to have slight right sided weakness. Grip strength slightly weaker on right and right foot weaker than left. Pt denies feeling weakness. No drift noted. Not slurring speech.

## 2021-01-14 NOTE — Telephone Encounter (Signed)
Attempted to reach patient, unable to leave message. Forwarded to VM. Spoke with husband to advise of situation and it would be best to go to ED for evaluation

## 2021-01-14 NOTE — Telephone Encounter (Signed)
[facility] - Client TELEPHONE ADVICE RECORD [facility] Patient Name: [name] Gender: Female DOB: January 03, 1961 Age: 60 Y 11 M 10 D Return Phone Number: [phone] (Primary) Address: City/State/Zip: [address] [address] Client [facility] - Client Client Site [facility] - Day Physician Crissie Sickles - MD Contact Type Call Who Is Calling Patient / Member / Family / Caregiver Call Type Triage / Clinical Relationship To Patient Self Return Phone Number [phone] (Primary) Chief Complaint Headache Reason for Call Symptomatic / Request for [place] states she needs to make an appointment. She feels like her brain is on fire. She sleeps with an ice pack on the back of her neck because her head feels so hot. Caller does not feel this is an emergency. She had a TIA in August. Her right eye is starting to twitch. She thinks she needs a neurological scan. THis is not new she has dealt with it for 9 months or so and doctor wont be in until Thursday. The patients call was transferred from the office. Translation No Nurse Assessment Nurse: Joya Gaskins, RN, Vonna Kotyk Date/Time Eilene Ghazi Time): 01/14/2021 4:26:22 PM Confirm and document reason for call. If symptomatic, describe symptoms. ---Caller states that her brain is "on fire". She has dealt with it for months. Hx of TIA in August. She had an open wound on her head for approx 6 months after hitting head with her glasses on her head. Her right eye is also twitching. Does the patient have any new or worsening symptoms? ---Yes Will a triage be completed? ---Yes Related visit to physician within the last 2 weeks? ---No Does the PT have any chronic conditions? (i.e. diabetes, asthma, this includes High risk factors for pregnancy, etc.) ---Yes List chronic conditions. ---TIA, Is this a behavioral health or substance abuse call?  ---No Guidelines Guideline Title Affirmed Question Affirmed Notes Nurse Date/Time (Eastern Time) Headache Patient sounds very sick or weak to the triager Joya Gaskins, RN, Vonna Kotyk 01/14/2021 4:29:41 PM Disp. Time Eilene Ghazi Time) Disposition Final User PLEASE NOTE: All timestamps contained within this report are represented as [place] Standard Time. CONFIDENTIALTY NOTICE: This fax transmission is intended only for the addressee. It contains information that is legally privileged, confidential or otherwise protected from use or disclosure. If you are not the intended recipient, you are strictly prohibited from reviewing, disclosing, copying using or disseminating any of this information or taking any action in reliance on or regarding this information. If you have received this fax in error, please notify us immediately by telephone so that we can arrange for its return to [place]. Phone: [phone], Toll-Free: [phone], Fax: [phone] Page: 2 of 2 Call Id: [id number] 01/14/2021 4:33:09 PM Go to ED Now (or PCP triage) Yes Joya Gaskins, RN, Aviva Kluver Disagree/Comply Comply Caller Understands Yes PreDisposition Call Doctor Care Advice Given Per Guideline GO TO ED NOW (OR PCP TRIAGE): * IF NO PCP (PRIMARY CARE PROVIDER) SECOND-LEVEL TRIAGE: You need to be seen within the next hour. Go to the [facility] at _____________ [place] as soon as you can. CARE ADVICE given per Headache (Adult) guideline. Referrals GO TO FACILITY UNDECIDED

## 2021-01-14 NOTE — ED Triage Notes (Addendum)
Pt arrives complaining of intermittent "burning in my brain" headache for the past 9 months. Today states her right eye started "twitching" and had a moment of double vision around 1500. Also states slurred 2 words earlier. Hx of TIA September 2021.

## 2021-01-15 ENCOUNTER — Other Ambulatory Visit: Payer: Self-pay

## 2021-01-15 NOTE — Telephone Encounter (Signed)
Pt discharged from ED yesterday

## 2021-01-17 ENCOUNTER — Ambulatory Visit: Payer: BC Managed Care – PPO | Admitting: Family Medicine

## 2021-01-17 ENCOUNTER — Encounter: Payer: Self-pay | Admitting: Family Medicine

## 2021-01-17 ENCOUNTER — Other Ambulatory Visit: Payer: Self-pay

## 2021-01-17 VITALS — BP 99/66 | HR 74 | Temp 98.8°F | Resp 16 | Ht 65.0 in | Wt 182.4 lb

## 2021-01-17 DIAGNOSIS — Z87898 Personal history of other specified conditions: Secondary | ICD-10-CM | POA: Diagnosis not present

## 2021-01-17 DIAGNOSIS — G8929 Other chronic pain: Secondary | ICD-10-CM

## 2021-01-17 DIAGNOSIS — R519 Headache, unspecified: Secondary | ICD-10-CM | POA: Diagnosis not present

## 2021-01-17 DIAGNOSIS — R4189 Other symptoms and signs involving cognitive functions and awareness: Secondary | ICD-10-CM | POA: Diagnosis not present

## 2021-01-17 NOTE — Progress Notes (Signed)
OFFICE VISIT  01/17/2021  CC:  Chief Complaint  Patient presents with  . ED follow up    HPI:    Patient is a 59 y.o. Caucasian female with "borderline" DM 2, HLD, and hx of tobacco abuse who presents for ED f/u from 01/14/21. She presented to [facility] ED 01/14/21 for complaint of "brain on fire".  I reviewed the encounter note today. She apparently had right eyelid twitching and some slurred words during a work meeting that day and she was subsequently triaged by our nurseline to the ED.  She had no further focal neuro symptoms there, no exam abnormality.  There was discussion of the arm paresthesias she had last fall as well (?TIA->no neuroimaging done but carotids neg, echo nl, rhythm monitoring nl), and some ongoing worries she has been having about headaches and a focal area of her scalp that seems to have been draining (noted as small area of folliculitis on ED provider's exam).  It was also noted that in the recent past (11/2020) she has started seeing an Integrative Medicine provider and was started on doxy for lyme dz. No imaging or labs were done in the ED, no meds rx'd.  She was given a referral to a neurologist.  UPDATE: She continues to swear by the sensation she is having as "brain is on fire".  No HAs.  Specifically states it is like "brain freeze but the OPPOSITE sensation of freezing".   Says onset 8-9 mo ago, intermittent but getting more persistent. Hx of "brain fog" as well but says it is getting worse.  Sometimes finds it hard to find words.   Apparently some slurring of words that coworkers tell her she does from time to time pt pt doesn't notice.  No periods of unresponsiveness.  No paresthesias or focal weakness.  No double vision. No probs with b/b control.  Had some R eyelid twitching the day she was told to go to the ED recently--but not persistent droop and it sounds like only a few twitches occurred.  ROS as above, plus--> no fevers, no CP, no SOB, no wheezing, no  cough, no dizziness, no rashes, no melena/hematochezia.  No polyuria or polydipsia.  No myalgias.  She describes intermittent LB and L hip pain--chronic.  No focal weakness, paresthesias, or tremors.  No acute vision or hearing abnormalities.  No dysuria or unusual/new urinary urgency or frequency.  No recent changes in lower legs. No n/v/d or abd pain.  No palpitations.     Past Medical History:  Diagnosis Date  . BPPV (benign paroxysmal positional vertigo)    Vestib rehab ref-->07/15/18  . Chronic headaches   . Chronic renal insufficiency, stage 2 (mild)    borderlined II/III (GF@59 )  . Depression   . Diabetes mellitus ([place]) 10/2019 prediab; 04/2020 DM 2   Fasting gluc low 120s, Hba1c 6.3%. June 2021 A1c 7%  . History of adenomatous polyp of colon 03/09/2014   tubular adenoma 2011   . Hyperlipidemia 10/2019   10 yr [place] CV risk= 3.5%->[facility].  LDL 170s + new dx DM 04/2020->pt declines any statin b/c of myalgis on 2 diff ones.  . Migraine   . NASH (nonalcoholic steatohepatitis) 03/21/2010   Qualifier: Diagnosis of  By: [name] MD, [name]   . OSA (obstructive sleep apnea) 10/14/2012   Sleep study completed on 09/22/2012-interpreting physician Clinton D. Young MD  Impression: 1. 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 2. Moderate scarring with oxygen saturations  were -87% with mean saturation to the study night at 92% on room air. 3. Regular cardiac rhythm and the heart rate is 60 beats per minute.   Marland Kitchen [facility] spotted fever) 2017/18   Lyme neg  . Tobacco dependence    switched to vap 2017    Past Surgical History:  Procedure Laterality Date  . ABDOMINAL HYSTERECTOMY  2010   [name]  . CAROTID DOPPLERS Bilateral 07/24/2020   NORMAL  . CHOLECYSTECTOMY N/A 03/10/2014   Procedure: LAPAROSCOPIC CHOLECYSTECTOMY WITH INTRAOPERATIVE CHOLANGIOGRAM;  Surgeon: [name], MD;  Location: [facility];  Service: General;  Laterality: N/A;  . COLONOSCOPY W/ BIOPSIES   2011; 03/2014   Dr [name] 2011.  [name] 2015 (polyps-->recall 5 yrs).  . ESOPHAGOGASTRODUODENOSCOPY  2011   Dr [name]  . LAPAROSCOPIC APPENDECTOMY  2009   [name]  . RHYTHM MONITORING  07/2020   normal  . TEMPOROMANDIBULAR JOINT ARTHROPLASTY    . TEMPOROMANDIBULAR JOINT SURGERY    . TONSILLECTOMY    . TRANSTHORACIC ECHOCARDIOGRAM  08/2020   COMPLETELY NORMAL   Social History   Socioeconomic History  . Marital status: Married    Spouse name: Not on file  . Number of children: 0  . Years of education: College  . Highest education level: Not on file  Occupational History  . Occupation: Cabin crew and benefits    Comment: [employer]  Tobacco Use  . Smoking status: Former Smoker    Packs/day: 1.50    Years: 32.00    Pack years: 48.00    Types: Cigarettes    Quit date: 08/01/2016    Years since quitting: 4.4  . Smokeless tobacco: Never Used  Vaping Use  . Vaping Use: Every day  . Start date: 04/09/2016  Substance and Sexual Activity  . Alcohol use: Yes    Alcohol/week: 0.0 standard drinks    Comment: occassional  . Drug use: No  . Sexual activity: Yes    Birth control/protection: Surgical  Other Topics Concern  . Not on file  Social History Narrative   She is originally from [place].  Relocated to [place] from [place].   She works for [employer].   Married.   No children.   No alcohol.   Tobacco: 60 pack-yr hx: switched to e cig 2017.   Social Determinants of Health   Financial Resource Strain: Not on file  Food Insecurity: Not on file  Transportation Needs: Not on file  Physical Activity: Not on file  Stress: Not on file  Social Connections: Not on file    Outpatient Medications Prior to Visit  Medication Sig Dispense Refill  . aspirin 81 MG chewable tablet Chew 81 mg by mouth daily.    Marland Kitchen doxycycline (VIBRA-TABS) 100 MG tablet Take 100 mg by mouth 2 (two) times daily.    Marland Kitchen estradiol (ESTRACE) 0.1 MG/GM vaginal cream PLEASE  SEE ATTACHED FOR DETAILED DIRECTIONS (Patient not taking: Reported on 01/17/2021)    . fluorouracil (EFUDEX) 5 % cream Apply topically. (Patient not taking: No sig reported)    . promethazine (PHENERGAN) 12.5 MG tablet 1-2 tabs po q6h prn dizziness and nausea (Patient not taking: No sig reported) 30 tablet 1   No facility-administered medications prior to visit.    Allergies  Allergen Reactions  . Amoxicillin-Pot Clavulanate Hives, Rash, Other (See Comments) and Hypertension    Extreme headache Has patient had a PCN reaction causing immediate rash, facial/tongue/throat swelling, SOB  or lightheadedness with hypotension: Yes Has patient had a PCN reaction causing severe rash involving mucus membranes or skin necrosis: Yes  Has patient had a PCN reaction that required hospitalization No Has patient had a PCN reaction occurring within the last 10 years: Yes - 2010 If all of the above answers are "NO", then may proceed with Cephalosporin use.   . Bee Venom Swelling    Swelling at the site   . Shellfish Allergy Swelling    joints    ROS As per HPI  PE: Vitals with BMI 01/17/2021 01/14/2021 08/15/2020  Height 5' 5"  - 5' 5"   Weight 182 lbs 6 oz 188 lbs 11 oz 178 lbs  BMI 64.40 - 34.74  Systolic 99 259 89  Diastolic 66 78 59  Pulse 74 50 67     Gen: Alert, well appearing.  Patient is oriented to person, place, time, and situation. AFFECT: pleasant, lucid thought and speech. Scalp: normal except some diffuse flakiness. DGL:OVFI: no injection, icteris, swelling, or exudate.  EOMI, PERRLA. Mouth: lips without lesion/swelling.  Oral mucosa pink and moist. Oropharynx without erythema, exudate, or swelling.  Neck - No masses or thyromegaly or limitation in range of motion, no bruit. CV: RRR, no m/r/g.   LUNGS: CTA bilat, nonlabored resps, good aeration in all lung fields. ABD: soft, NT/ND EXT: no clubbing or cyanosis.  no edema.  Neuro: CN 2-12 intact bilaterally, strength 5/5 in proximal  and distal upper extremities and lower extremities bilaterally.  No sensory deficits.  No tremor.  No disdiadochokinesis.  No ataxia.  Upper extremity and lower extremity DTRs symmetric.  No pronator drift.  LABS:  Lab Results  Component Value Date   TSH 1.02 07/12/2020   Lab Results  Component Value Date   WBC 8.6 07/12/2020   HGB 13.6 07/12/2020   HCT 40.2 07/12/2020   MCV 91.0 07/12/2020   PLT 207 07/12/2020   Lab Results  Component Value Date   CREATININE 1.08 (H) 07/12/2020   BUN 19 07/12/2020   NA 138 07/12/2020   K 4.2 07/12/2020   CL 106 07/12/2020   CO2 21 07/12/2020   Lab Results  Component Value Date   ALT 24 07/12/2020   AST 20 07/12/2020   ALKPHOS 60 10/18/2019   BILITOT 0.3 07/12/2020   Lab Results  Component Value Date   CHOL 261 (H) 04/23/2020   Lab Results  Component Value Date   HDL 52.80 04/23/2020   Lab Results  Component Value Date   LDLCALC 173 (H) 04/23/2020   Lab Results  Component Value Date   TRIG 179.0 (H) 04/23/2020   Lab Results  Component Value Date   CHOLHDL 5 04/23/2020   Lab Results  Component Value Date   HGBA1C 6.0 (A) 08/15/2020   HGBA1C 6.0 08/15/2020   HGBA1C 6.0 08/15/2020   HGBA1C 6.0 08/15/2020   IMPRESSION AND PLAN:  Atypical headache syndrome. Normal neuro exam. Hx of unexplained R arm paresthesias 07/2020--see w/u details in [place] section. MRI brain w/out and with 09/2019 with nonspecific scattered changes w/in cerebral white matter advanced for age--likely reflect chronic small vessel ischemic dz.   I told her I really can't explain her symptoms and I have referred her to a neurologist for further evaluation. No imaging or lab work ordered today.  I'll request records from [facility] for her, particularly regarding pt report of recent dx of Lyme dz.  An After Visit Summary was printed and given to the patient.  FOLLOW UP: Return for as needed.  Signed:  Crissie Sickles, MD            01/17/2021

## 2021-01-29 ENCOUNTER — Encounter: Payer: Self-pay | Admitting: Family Medicine

## 2021-02-10 DIAGNOSIS — E538 Deficiency of other specified B group vitamins: Secondary | ICD-10-CM | POA: Diagnosis not present

## 2021-02-10 DIAGNOSIS — E559 Vitamin D deficiency, unspecified: Secondary | ICD-10-CM | POA: Diagnosis not present

## 2021-02-10 DIAGNOSIS — L729 Follicular cyst of the skin and subcutaneous tissue, unspecified: Secondary | ICD-10-CM | POA: Diagnosis not present

## 2021-02-10 DIAGNOSIS — R5383 Other fatigue: Secondary | ICD-10-CM | POA: Diagnosis not present

## 2021-02-13 DIAGNOSIS — L821 Other seborrheic keratosis: Secondary | ICD-10-CM | POA: Diagnosis not present

## 2021-02-13 DIAGNOSIS — R21 Rash and other nonspecific skin eruption: Secondary | ICD-10-CM | POA: Diagnosis not present

## 2021-03-05 ENCOUNTER — Ambulatory Visit: Payer: BC Managed Care – PPO | Admitting: Neurology

## 2021-03-05 ENCOUNTER — Other Ambulatory Visit: Payer: Self-pay

## 2021-03-05 ENCOUNTER — Encounter: Payer: Self-pay | Admitting: Neurology

## 2021-03-05 VITALS — BP 113/65 | HR 85 | Ht 65.0 in | Wt 187.2 lb

## 2021-03-05 DIAGNOSIS — G43019 Migraine without aura, intractable, without status migrainosus: Secondary | ICD-10-CM

## 2021-03-05 MED ORDER — GABAPENTIN 300 MG PO CAPS: 300.0000 mg | ORAL_CAPSULE | Freq: Every day | ORAL | 3 refills | Status: DC

## 2021-03-05 MED ORDER — ALPRAZOLAM 0.5 MG PO TABS: ORAL_TABLET | ORAL | 0 refills | Status: DC

## 2021-03-05 NOTE — Progress Notes (Signed)
Reason for visit: Burning sensations in the head, possible TIA  Referring physician: Dr. Dorann Lodge [name] is a 60 y.o. female  History of present illness:  [name] is a 60 year old right-handed white female who was seen through this office in 2017 with what was felt to be migraine headaches.  The patient has returned with some new issues at this time.  She indicates that in September 2021, she had a 15 or 20-minute episode of left-sided numbness.  She underwent a carotid Doppler study around that time that was unremarkable.  She has not had recurrence of this event.  She was in a meeting in March 2022, she noted a brief episode of twitching of the right upper eyelid and then may have slurred a couple of words during a conversation.  She went to the emergency room on 14 January 2021 but no blood work or other evaluation was done.  The patient goes on to say that she has been treated through a holistic center for Lyme disease for a number of years.  Since the summer 2021 she has had episodes of feeling burning sensations in head pain in the back of the head that mainly occurs at night.  The episodes occur once or twice a week.  Occasionally, she may have episodes during the day.  She does have some neck stiffness.  She reports that she feels weak all over and she does have some chronic low back pain.  She denies any significant balance issues or difficulty controlling the bowels or the bladder.  She reports a chronic issue with "brain fog".  Given the above symptoms, she is sent to this office for an evaluation.  Past Medical History:  Diagnosis Date  . BPPV (benign paroxysmal positional vertigo)    Vestib rehab ref-->07/15/18  . Chronic headaches   . Chronic renal insufficiency, stage 2 (mild)    borderlined II/III (GF@59 )  . Depression   . Diabetes mellitus ([place]) 10/2019 prediab; 04/2020 DM 2   Fasting gluc low 120s, Hba1c 6.3%. June 2021 A1c 7%  . History of adenomatous polyp of colon  03/09/2014   tubular adenoma 2011   . Hyperlipidemia 10/2019   10 yr [place] CV risk= 3.5%->[facility].  LDL 170s + new dx DM 04/2020->pt declines any statin b/c of myalgis on 2 diff ones.  . Migraine   . NASH (nonalcoholic steatohepatitis) 03/21/2010   Qualifier: Diagnosis of  By: [name] MD, [name]   . OSA (obstructive sleep apnea) 10/14/2012   Sleep study completed on 09/22/2012-interpreting physician Clinton D. Young MD  Impression: 1. 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 2. Moderate scarring with oxygen saturations were -87% with mean saturation to the study night at 92% on room air. 3. Regular cardiac rhythm and the heart rate is 60 beats per minute.   Marland Kitchen [facility] spotted fever) 2017/18   Lyme neg  . Tobacco dependence    switched to vap 2017    Past Surgical History:  Procedure Laterality Date  . ABDOMINAL HYSTERECTOMY  2010   [name]  . CAROTID DOPPLERS Bilateral 07/24/2020   NORMAL  . cataract surg     2021  . CHOLECYSTECTOMY N/A 03/10/2014   Procedure: LAPAROSCOPIC CHOLECYSTECTOMY WITH INTRAOPERATIVE CHOLANGIOGRAM;  Surgeon: [name], MD;  Location: [facility];  Service: General;  Laterality: N/A;  . COLONOSCOPY W/ BIOPSIES  2011; 03/2014   Dr [name] 2011.  [name] 2015 (polyps-->recall 5  yrs).  . ESOPHAGOGASTRODUODENOSCOPY  2011   Dr [name]  . LAPAROSCOPIC APPENDECTOMY  2009   [name]  . RHYTHM MONITORING  07/2020   normal  . TEMPOROMANDIBULAR JOINT ARTHROPLASTY    . TEMPOROMANDIBULAR JOINT SURGERY    . TONSILLECTOMY    . TRANSTHORACIC ECHOCARDIOGRAM  08/2020   COMPLETELY NORMAL    Family History  Problem Relation Age of Onset  . Heart disease Father   . Hypertension Father   . Hemochromatosis Father   . Diabetes Mother   . Diabetes Brother   . Hyperlipidemia Other   . Arthritis Other   . Sudden death Other   . Alcohol abuse Maternal Grandfather     Social history:  reports that she quit smoking about 4 years ago. Her  smoking use included cigarettes. She has a 48.00 pack-year smoking history. She has never used smokeless tobacco. She reports current alcohol use. She reports that she does not use drugs.  Medications:  Prior to Admission medications   Medication Sig Start Date End Date Taking? Authorizing Provider  aspirin 81 MG chewable tablet Chew 81 mg by mouth daily.   Yes [provider]  estradiol (ESTRACE) 0.1 MG/GM vaginal cream  12/04/20  Yes [provider]  fluorouracil (EFUDEX) 5 % cream Apply topically. 08/13/20  Yes [provider]  doxycycline (VIBRA-TABS) 100 MG tablet Take 100 mg by mouth 2 (two) times daily. 01/13/21   [provider]  promethazine (PHENERGAN) 12.5 MG tablet 1-2 tabs po q6h prn dizziness and nausea 07/15/18   McGowen, Adrian Blackwater, MD      Allergies  Allergen Reactions  . Amoxicillin-Pot Clavulanate Hives, Rash, Other (See Comments) and Hypertension    Extreme headache Has patient had a PCN reaction causing immediate rash, facial/tongue/throat swelling, SOB or lightheadedness with hypotension: Yes Has patient had a PCN reaction causing severe rash involving mucus membranes or skin necrosis: Yes  Has patient had a PCN reaction that required hospitalization No Has patient had a PCN reaction occurring within the last 10 years: Yes - 2010 If all of the above answers are "NO", then may proceed with Cephalosporin use.   . Bee Venom Swelling    Swelling at the site   . Shellfish Allergy Swelling    joints    ROS:  Out of a complete 14 system review of symptoms, the patient complains only of the following symptoms, and all other reviewed systems are negative.  Memory problems Headache Transient numbness Low back pain  Blood pressure 113/65, pulse 85, height 5' 5"  (1.651 m), weight 187 lb 3.2 oz (84.9 kg).  Physical Exam  General: The patient is alert and cooperative at the time of the examination.  Eyes: Pupils are equal, round, and  reactive to light. Discs are flat bilaterally.  Neck: The neck is supple, no carotid bruits are noted.  Respiratory: The respiratory examination is clear.  Cardiovascular: The cardiovascular examination reveals a regular rate and rhythm, no obvious murmurs or rubs are noted.  Skin: Extremities are without significant edema.  Neurologic Exam  Mental status: The patient is alert and oriented x 3 at the time of the examination. The patient has apparent normal recent and remote memory, with an apparently normal attention span and concentration ability.  Cranial nerves: Facial symmetry is present. There is good sensation of the face to pinprick and soft touch bilaterally, with exception of some decreased pinprick sensation on the right forehead. The strength of the facial muscles and  the muscles to head turning and shoulder shrug are normal bilaterally. Speech is well enunciated, no aphasia or dysarthria is noted. Extraocular movements are full. Visual fields are full. The tongue is midline, and the patient has symmetric elevation of the soft palate. No obvious hearing deficits are noted.  Motor: The motor testing reveals 5 over 5 strength of all 4 extremities. Good symmetric motor tone is noted throughout.  Sensory: Sensory testing is intact to pinprick, soft touch, vibration sensation, and position sense on all 4 extremities. No evidence of extinction is noted.  Coordination: Cerebellar testing reveals good finger-nose-finger and heel-to-shin bilaterally.  Gait and station: Gait is normal. Tandem gait is normal. Romberg is negative. No drift is seen.  Reflexes: Deep tendon reflexes are symmetric and normal bilaterally. Toes are downgoing bilaterally.  Carotid doppler 07/24/20:  IMPRESSION: Color duplex indicates minimal heterogeneous plaque, with no hemodynamically significant stenosis by duplex criteria in the extracranial cerebrovascular circulation.   Assessment/Plan:  1.  Episodic  burning sensations in the head  2.  Transient left-sided numbness  The patient has a very normal objective clinical examination.  We will set her up for MRI of the brain at this time.  We will initiate treatment with gabapentin for the burning sensations and headache.  She will take 300 mg in the evening.  She will call for any dose adjustments.  We will see her back in about 4 months.  The patient is on aspirin 81 mg daily.  Jill Alexanders MD 03/05/2021 2:38 PM  [facility] [address] [place], [place] [address]  Phone [phone] Fax [phone]

## 2021-03-05 NOTE — Patient Instructions (Signed)
We will start gabapentin 300 mg at night for the headache.  Neurontin (gabapentin) may result in drowsiness, ankle swelling, gait instability, or possibly dizziness. Please contact our office if significant side effects occur with this medication.

## 2021-03-06 ENCOUNTER — Telehealth: Payer: Self-pay | Admitting: Neurology

## 2021-03-06 NOTE — Telephone Encounter (Signed)
no to the covid questions MR Brain wo contrast Dr. Stephanie Acre Josem Kaufmann: [id number] (exp. 03/06/21 to 04/04/21). Patient is scheduled at [facility] for 03/12/21.

## 2021-03-12 ENCOUNTER — Other Ambulatory Visit: Payer: BC Managed Care – PPO

## 2021-03-12 ENCOUNTER — Ambulatory Visit: Payer: BC Managed Care – PPO

## 2021-03-12 DIAGNOSIS — G43019 Migraine without aura, intractable, without status migrainosus: Secondary | ICD-10-CM | POA: Diagnosis not present

## 2021-03-16 ENCOUNTER — Telehealth: Payer: Self-pay | Admitting: Neurology

## 2021-03-16 NOTE — Telephone Encounter (Signed)
I called the patient.  MRI of the brain shows very minimal white matter changes, nonspecific in nature, no change from November 2020.  I suspect that the episodes described by the patient are of a benign nature.    MRI brain 03/12/21:  IMPRESSION: Unremarkable MRI scan of the brain without contrast showing tiny nonspecific periventricular subcortical white matter hyperintensities with a differential discussed above.  Overall no significant change compared with previous MRI from 09/13/2019.

## 2021-04-16 DIAGNOSIS — E538 Deficiency of other specified B group vitamins: Secondary | ICD-10-CM | POA: Diagnosis not present

## 2021-04-16 DIAGNOSIS — Z7409 Other reduced mobility: Secondary | ICD-10-CM | POA: Diagnosis not present

## 2021-04-16 DIAGNOSIS — R5383 Other fatigue: Secondary | ICD-10-CM | POA: Diagnosis not present

## 2021-04-16 DIAGNOSIS — Z720 Tobacco use: Secondary | ICD-10-CM | POA: Diagnosis not present

## 2021-04-16 DIAGNOSIS — Z1329 Encounter for screening for other suspected endocrine disorder: Secondary | ICD-10-CM | POA: Diagnosis not present

## 2021-04-16 DIAGNOSIS — Z8619 Personal history of other infectious and parasitic diseases: Secondary | ICD-10-CM | POA: Diagnosis not present

## 2021-04-16 DIAGNOSIS — K909 Intestinal malabsorption, unspecified: Secondary | ICD-10-CM | POA: Diagnosis not present

## 2021-04-16 DIAGNOSIS — Z820 Family history of epilepsy and other diseases of the nervous system: Secondary | ICD-10-CM | POA: Diagnosis not present

## 2021-04-16 DIAGNOSIS — E559 Vitamin D deficiency, unspecified: Secondary | ICD-10-CM | POA: Diagnosis not present

## 2021-04-21 ENCOUNTER — Telehealth: Payer: Self-pay | Admitting: Neurology

## 2021-04-21 NOTE — Telephone Encounter (Signed)
Pt called and LVM stating and I quote "My brain is on fire"  Pt states she had an MRI done and the doctor did not find anything and her seizure medication is not working for her migraines. Pt would like to discuss.

## 2021-04-22 ENCOUNTER — Other Ambulatory Visit: Payer: Self-pay | Admitting: Orthopedic Surgery

## 2021-04-22 DIAGNOSIS — M5416 Radiculopathy, lumbar region: Secondary | ICD-10-CM

## 2021-04-22 DIAGNOSIS — M533 Sacrococcygeal disorders, not elsewhere classified: Secondary | ICD-10-CM | POA: Diagnosis not present

## 2021-04-22 MED ORDER — GABAPENTIN 300 MG PO CAPS: ORAL_CAPSULE | ORAL | 3 refills | Status: DC

## 2021-04-22 NOTE — Telephone Encounter (Signed)
I called the patient.  She is still having some headaches on gabapentin 300 mg daily, will go to 600 mg daily for 1 week, then she will take 300 mg in the morning and 600 mg in the evening, she will call for any further dose adjustments.

## 2021-04-29 ENCOUNTER — Emergency Department (HOSPITAL_BASED_OUTPATIENT_CLINIC_OR_DEPARTMENT_OTHER)
Admission: EM | Admit: 2021-04-29 | Discharge: 2021-04-29 | Disposition: A | Discharge status: Home / Self Care | Payer: BC Managed Care – PPO | Attending: Emergency Medicine | Admitting: Emergency Medicine

## 2021-04-29 ENCOUNTER — Emergency Department (HOSPITAL_BASED_OUTPATIENT_CLINIC_OR_DEPARTMENT_OTHER): Payer: BC Managed Care – PPO

## 2021-04-29 ENCOUNTER — Encounter: Payer: Self-pay | Admitting: Family Medicine

## 2021-04-29 ENCOUNTER — Other Ambulatory Visit: Payer: Self-pay

## 2021-04-29 DIAGNOSIS — Z7982 Long term (current) use of aspirin: Secondary | ICD-10-CM | POA: Diagnosis not present

## 2021-04-29 DIAGNOSIS — N182 Chronic kidney disease, stage 2 (mild): Secondary | ICD-10-CM | POA: Diagnosis not present

## 2021-04-29 DIAGNOSIS — F419 Anxiety disorder, unspecified: Secondary | ICD-10-CM | POA: Diagnosis not present

## 2021-04-29 DIAGNOSIS — E119 Type 2 diabetes mellitus without complications: Secondary | ICD-10-CM | POA: Insufficient documentation

## 2021-04-29 DIAGNOSIS — Z87891 Personal history of nicotine dependence: Secondary | ICD-10-CM | POA: Diagnosis not present

## 2021-04-29 DIAGNOSIS — R531 Weakness: Secondary | ICD-10-CM | POA: Diagnosis not present

## 2021-04-29 DIAGNOSIS — R0602 Shortness of breath: Secondary | ICD-10-CM | POA: Diagnosis not present

## 2021-04-29 HISTORY — DX: Sacrococcygeal disorders, not elsewhere classified: M53.3

## 2021-04-29 LAB — COMPREHENSIVE METABOLIC PANEL
ALT: 52 U/L — ABNORMAL HIGH (ref 0–44)
AST: 36 U/L (ref 15–41)
Albumin: 4.3 g/dL (ref 3.5–5.0)
Alkaline Phosphatase: 61 U/L (ref 38–126)
Anion gap: 8 (ref 5–15)
BUN: 15 mg/dL (ref 8–23)
CO2: 23 mmol/L (ref 22–32)
Calcium: 9.5 mg/dL (ref 8.9–10.3)
Chloride: 103 mmol/L (ref 98–111)
Creatinine, Ser: 0.86 mg/dL (ref 0.44–1.00)
GFR, Estimated: >60 mL/min (ref >60)
Glucose, Bld: 103 mg/dL — ABNORMAL HIGH (ref 70–99)
Potassium: 4.1 mmol/L (ref 3.5–5.1)
Sodium: 134 mmol/L — ABNORMAL LOW (ref 135–145)
Total Bilirubin: 0.4 mg/dL (ref 0.3–1.2)
Total Protein: 7.6 g/dL (ref 6.5–8.1)

## 2021-04-29 LAB — CBC WITH DIFFERENTIAL/PLATELET
Abs Immature Granulocytes: 0.02 10*3/uL (ref 0.00–0.07)
Basophils Absolute: 0 10*3/uL (ref 0.0–0.1)
Basophils Relative: 1 %
Eosinophils Absolute: 0.1 10*3/uL (ref 0.0–0.5)
Eosinophils Relative: 2 %
HCT: 39.9 % (ref 36.0–46.0)
Hemoglobin: 13.7 g/dL (ref 12.0–15.0)
Immature Granulocytes: 0 %
Lymphocytes Relative: 40 %
Lymphs Abs: 3.3 10*3/uL (ref 0.7–4.0)
MCH: 31.4 pg (ref 26.0–34.0)
MCHC: 34.3 g/dL (ref 30.0–36.0)
MCV: 91.5 fL (ref 80.0–100.0)
Monocytes Absolute: 0.4 10*3/uL (ref 0.1–1.0)
Monocytes Relative: 5 %
Neutro Abs: 4.4 10*3/uL (ref 1.7–7.7)
Neutrophils Relative %: 52 %
Platelets: 241 10*3/uL (ref 150–400)
RBC: 4.36 MIL/uL (ref 3.87–5.11)
RDW: 12.8 % (ref 11.5–15.5)
WBC: 8.4 10*3/uL (ref 4.0–10.5)
nRBC: 0 % (ref 0.0–0.2)

## 2021-04-29 LAB — URINALYSIS, ROUTINE W REFLEX MICROSCOPIC
Bilirubin Urine: NEGATIVE
Glucose, UA: NEGATIVE mg/dL
Ketones, ur: NEGATIVE mg/dL
Nitrite: NEGATIVE
Protein, ur: NEGATIVE mg/dL
Specific Gravity, Urine: 1.025 (ref 1.005–1.030)
pH: 5.5 (ref 5.0–8.0)

## 2021-04-29 LAB — TROPONIN I (HIGH SENSITIVITY): Troponin I (High Sensitivity): 3 ng/L (ref <18)

## 2021-04-29 LAB — URINALYSIS, MICROSCOPIC (REFLEX)

## 2021-04-29 LAB — D-DIMER, QUANTITATIVE: D-Dimer, Quant: 0.29 ug/mL-FEU (ref 0.00–0.50)

## 2021-04-29 MED ORDER — LORAZEPAM 2 MG/ML IJ SOLN
1.0000 mg | Freq: Once | INTRAMUSCULAR | Status: AC
  Administered 2021-04-29: 1 mg via INTRAVENOUS
  Filled 2021-04-29: qty 1

## 2021-04-29 MED ORDER — HYDROXYZINE HCL 25 MG PO TABS: 25.0000 mg | ORAL_TABLET | Freq: Four times a day (QID) | ORAL | 0 refills | Status: DC

## 2021-04-29 MED ORDER — NITROFURANTOIN MONOHYD MACRO 100 MG PO CAPS: 100.0000 mg | ORAL_CAPSULE | Freq: Two times a day (BID) | ORAL | 0 refills | Status: DC

## 2021-04-29 NOTE — ED Triage Notes (Signed)
Pt c/o SOB, weakness since dx with lyme disease dx in January-states her SOB, weakness is worse x 2 days-pt NAD-pt holds breath then takes a loud deep breath-to triage in w/c

## 2021-04-29 NOTE — ED Provider Notes (Signed)
[facility] EMERGENCY DEPARTMENT Provider Note   CSN: [id number] Arrival date & time: 04/29/21  1506     History Chief Complaint  Patient presents with   Shortness of Breath    [name] is a 60 y.o. female withpertinent medical history including chronic headaches, BPPV, [place] spotted fever, Lyme disease, migraines, OSA that presents to the emergency department today for shortness of breath and weakness.  Patient states that she has been feeling weak for the past 9 months, however over the last day was worse.  States that when she woke up this morning she couldnt even walk and had sudden shortness of breath, describes it as" constant hyperventilating" which lasted 2 hours.  Patient states that she does not have any specific trigger, has never had anything happen her like this before.  Denies any previous anxiety attack, however for her husband has works in the [place] states that he thinks that she was having a panic attack.  Denies any chest pain, dizziness, vision changes, paresthesias.  Patient states that she has been struggling with weakness after she was diagnosed with Lyme disease in January, states that she finished 2 months worth of doxycycline and has now also been struggling with a " head on fire" burning sensation.  Patient recently saw neurology for this and had a negative MRI brain.  They started her on gabapentin, patient states that she is been compliant with this.  Patient denies any chest pain.  States that shortness of breath has gotten much better whilst laying in the bed, however states that she struggling to get a deep breath currently.  Denies any nausea, vomiting, abdominal pain, has been eating and drinking normally.  Normal bowel movements.  Denies any previous coagulation history.  Denies any cough or URI symptoms.  No sick contacts.  Denies any history of anxiety.  HPI     Past Medical History:  Diagnosis Date   BPPV (benign paroxysmal  positional vertigo)    [facility] rehab ref-->07/15/18   Chronic headaches    Chronic renal insufficiency, stage 2 (mild)    borderlined II/III (GF@59 )   Depression    Diabetes mellitus ([place]) 10/2019 prediab; 04/2020 DM 2   Fasting gluc low 120s, Hba1c 6.3%. June 2021 A1c 7%   History of adenomatous polyp of colon 03/09/2014   tubular adenoma 2011    Hyperlipidemia 10/2019   10 yr [place] CV risk= 3.5%->TLC.  LDL 170s + new dx DM 04/2020->pt declines any statin b/c of myalgis on 2 diff ones.   Migraine    NASH (nonalcoholic steatohepatitis) 03/21/2010   Qualifier: Diagnosis of  By: [name] MD, [name]    OSA (obstructive sleep apnea) 10/14/2012   Sleep study completed on 09/22/2012-interpreting physician Clinton D. Young MD  Impression: 1. 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 2. Moderate scarring with oxygen saturations were -87% with mean saturation to the study night at 92% on room air. 3. Regular cardiac rhythm and the heart rate is 60 beats per minute.    [facility] spotted fever) 2017/18   Lyme neg   Sacroiliac pain    chronic, left (Dr. Othelia Pulling eval 04/2021->possible SI fusion surgery   Tobacco dependence    switched to vap 2017    Patient Active Problem List   Diagnosis Date Noted   Sun-damaged skin 08/04/2017   Peripheral tear of medial meniscus of left knee as current injury 01/04/2017   Common migraine with intractable migraine  05/11/2016   Non-intractable cyclical vomiting without nausea 08/28/2015   Carbuncle, thigh 12/19/2014   Personal history of colonic polyps 03/09/2014   Nausea and vomiting 03/09/2014   Abdominal distension - diffuse 03/09/2014   Obesity (BMI 30-39.9) 03/09/2014   Tobacco abuse 03/09/2014   Skin lesion of cheek 09/08/2013   Low back pain 08/21/2013   OSA (obstructive sleep apnea) 10/14/2012   Hyperlipidemia 09/07/2012   Chronic fatigue 08/16/2012   Nonspecific elevation of levels of transaminase or lactic acid  dehydrogenase (LDH) 08/16/2012   Tobacco use 08/16/2012   NASH (nonalcoholic steatohepatitis) 03/21/2010   IBS (irritable bowel syndrome) 11/26/2009    Past Surgical History:  Procedure Laterality Date   ABDOMINAL HYSTERECTOMY  2010   [name]   CAROTID DOPPLERS Bilateral 07/24/2020   NORMAL   cataract surg     2021   CHOLECYSTECTOMY N/A 03/10/2014   Procedure: LAPAROSCOPIC CHOLECYSTECTOMY WITH INTRAOPERATIVE CHOLANGIOGRAM;  Surgeon: [name], MD;  Location: [facility] OR[facility];  Service: General;  Laterality: N/A;   COLONOSCOPY W/ BIOPSIES  2011; 03/2014   Dr [name] 2011.  [name] 2015 (polyps-->recall 5 yrs).   ESOPHAGOGASTRODUODENOSCOPY  2011   Dr [name]   LAPAROSCOPIC APPENDECTOMY  2009   [name]   RHYTHM MONITORING  07/2020   normal   TEMPOROMANDIBULAR JOINT ARTHROPLASTY     TEMPOROMANDIBULAR JOINT SURGERY     TONSILLECTOMY     TRANSTHORACIC ECHOCARDIOGRAM  08/2020   COMPLETELY NORMAL     OB History   No obstetric history on file.     Family History  Problem Relation Age of Onset   Heart disease Father    Hypertension Father    Hemochromatosis Father    Diabetes Mother    Diabetes Brother    Hyperlipidemia Other    Arthritis Other    Sudden death Other    Alcohol abuse Maternal Grandfather     Social History   Tobacco Use   Smoking status: Former    Packs/day: 1.50    Years: 32.00    Pack years: 48.00    Types: Cigarettes    Quit date: 08/01/2016    Years since quitting: 4.7   Smokeless tobacco: Never  Vaping Use   Vaping Use: Every day   Start date: 04/09/2016  Substance Use Topics   Alcohol use: Yes    Alcohol/week: 0.0 standard drinks    Comment: occassional   Drug use: No    Home Medications Prior to Admission medications   Medication Sig Start Date End Date Taking? Authorizing Provider  ALPRAZolam Duanne Moron) 0.5 MG tablet Take 2 tablets approximately 45 minutes prior to the MRI study, take a third tablet if needed. 03/05/21   Kathrynn Ducking, MD  aspirin 81 MG chewable tablet Chew 81 mg by mouth daily.    [provider]  estradiol (ESTRACE) 0.1 MG/GM vaginal cream  12/04/20   [provider]  fluorouracil (EFUDEX) 5 % cream Apply topically. 08/13/20   [provider]  gabapentin (NEURONTIN) 300 MG capsule 1 capsule twice daily for 1 week, then take 1 in the morning and 2 in the evening 04/22/21   Kathrynn Ducking, MD    Allergies    Amoxicillin-pot clavulanate, Bee venom, and Shellfish allergy  Review of Systems   Review of Systems  Constitutional:  Negative for chills, diaphoresis, fatigue and fever.  HENT:  Negative for congestion, sore throat and trouble swallowing.   Eyes:  Negative for pain  and visual disturbance.  Respiratory:  Positive for shortness of breath. Negative for cough and wheezing.   Cardiovascular:  Negative for chest pain, palpitations and leg swelling.  Gastrointestinal:  Negative for abdominal distention, abdominal pain, diarrhea, nausea and vomiting.  Genitourinary:  Negative for difficulty urinating.  Musculoskeletal:  Negative for back pain, neck pain and neck stiffness.  Skin:  Negative for pallor.  Neurological:  Positive for weakness. Negative for dizziness, speech difficulty and headaches.  Psychiatric/Behavioral:  Negative for confusion.    Physical Exam Updated Vital Signs BP (!) 119/55   Pulse (!) 59   Temp 97.7 F (36.5 C) (Oral)   Resp 15   SpO2 100%   Physical Exam Constitutional:      General: She is not in acute distress.    Appearance: Normal appearance. She is not ill-appearing, toxic-appearing or diaphoretic.  HENT:     Mouth/Throat:     Mouth: Mucous membranes are moist.     Pharynx: Oropharynx is clear.  Eyes:     General: No scleral icterus.    Extraocular Movements: Extraocular movements intact.     Pupils: Pupils are equal, round, and reactive to light.  Cardiovascular:     Rate and Rhythm: Normal rate and regular rhythm.     Pulses:  Normal pulses.     Heart sounds: Normal heart sounds.  Pulmonary:     Effort: Pulmonary effort is normal. No respiratory distress.     Breath sounds: Normal breath sounds. No stridor. No wheezing, rhonchi or rales.  Chest:     Chest wall: No tenderness.  Abdominal:     General: Abdomen is flat. There is no distension.     Palpations: Abdomen is soft.     Tenderness: There is no abdominal tenderness. There is no guarding or rebound.  Musculoskeletal:        General: No swelling or tenderness. Normal range of motion.     Cervical back: Normal range of motion and neck supple. No rigidity.     Right lower leg: No edema.     Left lower leg: No edema.  Skin:    General: Skin is warm and dry.     Capillary Refill: Capillary refill takes less than 2 seconds.     Coloration: Skin is not pale.  Neurological:     General: No focal deficit present.     Mental Status: She is alert and oriented to person, place, and time.  Psychiatric:        Mood and Affect: Mood is anxious.        Behavior: Behavior normal.    ED Results / Procedures / Treatments   Labs (all labs ordered are listed, but only abnormal results are displayed) Labs Reviewed  COMPREHENSIVE METABOLIC PANEL - Abnormal; Notable for the following components:      Result Value   Sodium 134 (*)    Glucose, Bld 103 (*)    ALT 52 (*)    All other components within normal limits  CBC WITH DIFFERENTIAL/PLATELET  URINALYSIS, ROUTINE W REFLEX MICROSCOPIC  D-DIMER, QUANTITATIVE    EKG None  Radiology DG Chest Portable 1 View  Result Date: 04/29/2021 CLINICAL DATA:  Shortness of breath EXAM: PORTABLE CHEST 1 VIEW COMPARISON:  06/16/2012 FINDINGS: The heart size and mediastinal contours are within normal limits. Both lungs are clear. The visualized skeletal structures are unremarkable. IMPRESSION: No active disease. Electronically Signed   By: Donavan Foil M.D.   On: 04/29/2021 16:02  Procedures Procedures   Medications  Ordered in ED Medications - No data to display  ED Course  I have reviewed the triage vital signs and the nursing notes.  Pertinent labs & imaging results that were available during my care of the patient were reviewed by me and considered in my medical decision making (see chart for details).    MDM Rules/Calculators/A&P                          Patient presents to the emerged department today for shortness of breath that started this morning.  Patient is nontoxic, no respite distress, satting at 100% on room air.  Differential to include anxiety attack.  PE and ACS low on the differential, however will obtain troponin and D-dimer.  Patient appears extremely anxious on exam, vital signs are stable.  Work-up today reassuring with CBC and CMP unremarkable.  D-dimer and troponin negative.  Chest x-ray interpreted me without any acute cardiopulmonary disease.  EKG interpreted me without any signs of ischemia.  Urinalysis does show some leukocytes and hemoglobin, after discussing this with patient she states that she is having some urinary symptoms primarily dysuria.  States that she is been having this for a while, will obtain urine culture, patient states that she does want a be treated for this at this time.  Upon reevaluation, patient states that she feels much better with Ativan, shortness of breath and hyperventilation have resolved.  Did discuss with patient this most likely anxiety, patient will follow-up with PCP.  Will give patient Atarax and patient education provided.  Doubt need for further emergent work up at this time. I explained the diagnosis and have given explicit precautions to return to the ER including for any other new or worsening symptoms. The patient understands and accepts the medical plan as it's been dictated and I have answered their questions. Discharge instructions concerning home care and prescriptions have been given. The patient is STABLE and is discharged to home in  good condition.  Final Clinical Impression(s) / ED Diagnoses Final diagnoses:  None    Rx / DC Orders ED Discharge Orders     None        Alfredia Client, PA-C 04/29/21 2122    Lucrezia Starch, MD 05/01/21 1505

## 2021-04-29 NOTE — Discharge Instructions (Addendum)
  You were evaluated in the Emergency Department and after careful evaluation, we did not find any emergent condition requiring admission or further testing in the hospital.   Your exam/testing today was overall reassuring.  Symptoms seem to be due to anxiety.  I want you to follow-up with your primary care doctor to talk about anxiety medications and anxiety techniques, I did provide you with some Atarax which you can take as needed for your anxiety.  I also gave you some instructions and I want you to read about managing your anxiety.  Your work-up today was reassuring, your urine did possibly have UTI, did prescribe you antibiotics for this as well.  He can follow-up with your urine culture with your primary care doctor.  Please return to the Emergency Department if you experience any worsening of your condition.  Thank you for allowing [place] to be a part of your care. Please speak to your pharmacist about any new medications prescribed today in regards to side effects or interactions with other medications.

## 2021-04-29 NOTE — ED Notes (Signed)
Pt sts waking up this morning weaker than normal increasing until lunch time. Presented to UC and was directed here for evaluation. Pt describes " a burning headache that is the opposite of a brain freeze". Sts she feels better. Ambulatory part of the way down hallway to provide urine specimen.

## 2021-05-01 ENCOUNTER — Other Ambulatory Visit: Payer: Self-pay

## 2021-05-01 ENCOUNTER — Ambulatory Visit (INDEPENDENT_AMBULATORY_CARE_PROVIDER_SITE_OTHER): Payer: BC Managed Care – PPO | Admitting: Family Medicine

## 2021-05-01 ENCOUNTER — Encounter: Payer: Self-pay | Admitting: Family Medicine

## 2021-05-01 VITALS — BP 108/74 | HR 67 | Temp 97.3°F | Resp 16 | Ht 65.0 in | Wt 179.4 lb

## 2021-05-01 DIAGNOSIS — S29011A Strain of muscle and tendon of front wall of thorax, initial encounter: Secondary | ICD-10-CM | POA: Diagnosis not present

## 2021-05-01 DIAGNOSIS — M25511 Pain in right shoulder: Secondary | ICD-10-CM

## 2021-05-01 LAB — URINE CULTURE: Culture: 50000 — AB

## 2021-05-01 NOTE — Patient Instructions (Signed)
Take 600 mg ibuprofen with food twice a day for the next 10 days. Apply ice for 20 min to the area of pain on your collar bone.

## 2021-05-01 NOTE — Progress Notes (Signed)
OFFICE VISIT  05/01/2021  CC:  Chief Complaint  Patient presents with   R shoulder pain    Over 3 months; gets worse every now and then. Has tried applying ice with no relief   Referral for infectious disease    Diagnosed with lyme disease in January; has not had tick bit in a year    HPI:    Patient is a 60 y.o. Caucasian Cannon who presents for "referral to infectious disease and shoulder pain". Went to ED 2 d/a for what sounds like a classic panic attack.  Blood and imaging eval reassuring.  Pt felt better after getting ativan in ED. She saw neuro 03/05/21 and was rx'd gabapentin for her "brain on fire" sensation.  MRI was done and it showed no signif abnormality.  Currently: Onset 3-4 mo ago, R clavicle pain, pops when abduct shoulder.  ON/off pain, worse at night.   NO injury prior.  No repetitive overhead reaching, no heavy lifting.  Sleeps with R arm up and that bothers her the most.  She did start getting emotional at the beginning of the visit, stating she is still dealing with the same "burning brain" sx's. I told her that we needed to move on and address her shoulder b/c I unfortunately have not been able to find out an explanation for these sx's, nor has a neurologist.  I have no further things to offer regarding these sx's, unfortunately.  She says she was told by provider at [facility] that she still has lyme dz and they want to do some sort of "ozone treatment of my blood".  She is apprehensive about this and would like referral to infectious dz MD.    Past Medical History:  Diagnosis Date   BPPV (benign paroxysmal positional vertigo)    [facility] rehab ref-->07/15/18   Chronic headaches    Chronic renal insufficiency, stage 2 (mild)    borderlined II/III (GF@59 )   Depression    Diabetes mellitus ([place]) 10/2019 prediab; 04/2020 DM 2   Fasting gluc low 120s, Hba1c 6.3%. June 2021 A1c 7%   History of adenomatous polyp of colon 03/09/2014   tubular adenoma  2011    Hyperlipidemia 10/2019   10 yr [place] CV risk= 3.5%->[facility].  LDL 170s + new dx DM 04/2020->pt declines any statin b/c of myalgis on 2 diff ones.   Migraine    NASH (nonalcoholic steatohepatitis) 03/21/2010   Qualifier: Diagnosis of  By: [name] MD, [name]    OSA (obstructive sleep apnea) 10/14/2012   Sleep study completed on 09/22/2012-interpreting physician Clinton D. Young MD  Impression: 1. 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 2. Moderate scarring with oxygen saturations were -87% with mean saturation to the study night at 92% on room air. 3. Regular cardiac rhythm and the heart rate is 60 beats per minute.    [facility] spotted fever) 2017/18   Lyme neg   Sacroiliac pain    chronic, left (Dr. Othelia Pulling eval 04/2021->possible SI fusion surgery   Tobacco dependence    switched to vap 2017    Past Surgical History:  Procedure Laterality Date   ABDOMINAL HYSTERECTOMY  2010   [name]   CAROTID DOPPLERS Bilateral 07/24/2020   NORMAL   cataract surg     2021   CHOLECYSTECTOMY N/A 03/10/2014   Procedure: LAPAROSCOPIC CHOLECYSTECTOMY WITH INTRAOPERATIVE CHOLANGIOGRAM;  Surgeon: [name], MD;  Location: [facility];  Service: General;  Laterality: N/A;  COLONOSCOPY W/ BIOPSIES  2011; 03/2014   Dr [name] 2011.  [name] 2015 (polyps-->recall 5 yrs).   ESOPHAGOGASTRODUODENOSCOPY  2011   Dr [name]   LAPAROSCOPIC APPENDECTOMY  2009   [name]   RHYTHM MONITORING  07/2020   normal   TEMPOROMANDIBULAR JOINT ARTHROPLASTY     TEMPOROMANDIBULAR JOINT SURGERY     TONSILLECTOMY     TRANSTHORACIC ECHOCARDIOGRAM  08/2020   COMPLETELY NORMAL    Outpatient Medications Prior to Visit  Medication Sig Dispense Refill   Acetaminophen-Codeine 300-30 MG tablet Take 1-2 tablets by mouth every 6 (six) hours as needed.     aspirin 81 MG chewable tablet Chew 81 mg by mouth daily.     cyclobenzaprine (FLEXERIL) 5 MG tablet Take 5-10 mg by mouth at bedtime as  needed.     gabapentin (NEURONTIN) 300 MG capsule 1 capsule twice daily for 1 week, then take 1 in the morning and 2 in the evening 90 capsule 3   mupirocin ointment (BACTROBAN) 2 % Apply 1 application topically 2 (two) times daily.     nitrofurantoin, macrocrystal-monohydrate, (MACROBID) 100 MG capsule Take 1 capsule (100 mg total) by mouth 2 (two) times daily. 10 capsule 0   ALPRAZolam (XANAX) 0.5 MG tablet Take 2 tablets approximately 45 minutes prior to the MRI study, take a third tablet if needed. (Patient not taking: Reported on 05/01/2021) 3 tablet 0   diazepam (VALIUM) 5 MG tablet Take by mouth. (Patient not taking: Reported on 05/01/2021)     estradiol (ESTRACE) 0.1 MG/GM vaginal cream  (Patient not taking: Reported on 05/01/2021)     hydrOXYzine (ATARAX/VISTARIL) 25 MG tablet Take 1 tablet (25 mg total) by mouth every 6 (six) hours. (Patient not taking: Reported on 05/01/2021) 12 tablet 0   methocarbamol (ROBAXIN) 500 MG tablet Take 1 tablet by mouth every 6 (six) hours as needed. (Patient not taking: Reported on 05/01/2021)     fluorouracil (EFUDEX) 5 % cream Apply topically. (Patient not taking: Reported on 05/01/2021)     No facility-administered medications prior to visit.    Allergies  Allergen Reactions   Amoxicillin-Pot Clavulanate Hives, Rash, Other (See Comments) and Hypertension    Extreme headache Has patient had a PCN reaction causing immediate rash, facial/tongue/throat swelling, SOB or lightheadedness with hypotension: Yes Has patient had a PCN reaction causing severe rash involving mucus membranes or skin necrosis: Yes  Has patient had a PCN reaction that required hospitalization No Has patient had a PCN reaction occurring within the last 10 years: Yes - 2010 If all of the above answers are "NO", then may proceed with Cephalosporin use.    Bee Venom Swelling    Swelling at the site    Shellfish Allergy Swelling    joints    ROS As per HPI  PE: Vitals with BMI  05/01/2021 04/29/2021 04/29/2021  Height 5' 5"  - -  Weight 179 lbs 6 oz - -  BMI 23.53 - -  Systolic 614 431 -  Diastolic 74 72 -  Pulse 67 68 63     Gen: Alert, well appearing.  Patient is oriented to person, place, time, and situation. AFFECT: pleasant, lucid thought and speech. R shoulder w/out deformity or asymmetry compared to L. She has full ROM, with abduction and IR/ER causing pain in R [place] joint. She has focal tenderness over R [place] jt, no palpable swelling, popping, or subluxation.  LABS:  Lab Results  Component Value Date   WBC 8.4  04/29/2021   HGB 13.7 04/29/2021   HCT 39.9 04/29/2021   MCV 91.5 04/29/2021   PLT 241 04/29/2021     Chemistry      Component Value Date/Time   NA 134 (L) 04/29/2021 1555   NA 137 12/04/2020 0000   K 4.1 04/29/2021 1555   CL 103 04/29/2021 1555   CO2 23 04/29/2021 1555   BUN 15 04/29/2021 1555   BUN 14 12/04/2020 0000   CREATININE 0.86 04/29/2021 1555   CREATININE 1.08 (H) 07/12/2020 1654   GLU 123 12/04/2020 0000      Component Value Date/Time   CALCIUM 9.5 04/29/2021 1555   ALKPHOS 61 04/29/2021 1555   AST 36 04/29/2021 1555   ALT 52 (H) 04/29/2021 1555   BILITOT 0.4 04/29/2021 1555     Lab Results  Component Value Date   TSH 0.98 12/04/2020   Lab Results  Component Value Date   HGBA1C 6.6 12/04/2020    IMPRESSION AND PLAN:  1) R shoulder pain, suspect sternoclavicular jt strain. Recommended ibup 671m with food bid x 10d. Ice to the area of tenderness for 229m at least daily. ROM exercises. If not signif improving in 10-14d then I will do x-ray of the area and refer to PT.  Regarding her concerns for lyme dz, she was treated with an appropriate course [place] of "chronic lyme dz" is one that medical dogma does not endorse.  I said I did not think a referral to an infectious dz MD would be helpful in this scenario. I recommended  she communicate her questions/concerns about this with her naturopathic provider.  Chronic "brain burning" sensation: she is being followed by neurology.  An After Visit Summary was printed and given to the patient.  FOLLOW UP: Return in about 4 weeks (around 05/29/2021) for f/u diabetes.  Signed:  PhCrissie SicklesMD           05/01/2021

## 2021-05-09 ENCOUNTER — Encounter (HOSPITAL_BASED_OUTPATIENT_CLINIC_OR_DEPARTMENT_OTHER): Payer: Self-pay

## 2021-05-09 ENCOUNTER — Other Ambulatory Visit: Payer: Self-pay

## 2021-05-09 ENCOUNTER — Emergency Department (HOSPITAL_BASED_OUTPATIENT_CLINIC_OR_DEPARTMENT_OTHER)
Admission: EM | Admit: 2021-05-09 | Discharge: 2021-05-09 | Disposition: A | Discharge status: Home / Self Care | Payer: BC Managed Care – PPO | Attending: Emergency Medicine | Admitting: Emergency Medicine

## 2021-05-09 DIAGNOSIS — Z87891 Personal history of nicotine dependence: Secondary | ICD-10-CM | POA: Insufficient documentation

## 2021-05-09 DIAGNOSIS — R509 Fever, unspecified: Secondary | ICD-10-CM

## 2021-05-09 DIAGNOSIS — Z20822 Contact with and (suspected) exposure to covid-19: Secondary | ICD-10-CM | POA: Diagnosis not present

## 2021-05-09 DIAGNOSIS — Z96698 Presence of other orthopedic joint implants: Secondary | ICD-10-CM | POA: Insufficient documentation

## 2021-05-09 DIAGNOSIS — R5383 Other fatigue: Secondary | ICD-10-CM

## 2021-05-09 DIAGNOSIS — M533 Sacrococcygeal disorders, not elsewhere classified: Secondary | ICD-10-CM | POA: Diagnosis not present

## 2021-05-09 DIAGNOSIS — R000 Tachycardia, unspecified: Secondary | ICD-10-CM | POA: Insufficient documentation

## 2021-05-09 DIAGNOSIS — Z7982 Long term (current) use of aspirin: Secondary | ICD-10-CM | POA: Insufficient documentation

## 2021-05-09 DIAGNOSIS — E119 Type 2 diabetes mellitus without complications: Secondary | ICD-10-CM | POA: Diagnosis not present

## 2021-05-09 LAB — RESP PANEL BY RT-PCR (FLU A&B, COVID) ARPGX2
Influenza A by PCR: NEGATIVE
Influenza B by PCR: NEGATIVE
SARS Coronavirus 2 by RT PCR: NEGATIVE

## 2021-05-09 MED ORDER — ACETAMINOPHEN 325 MG PO TABS
650.0000 mg | ORAL_TABLET | Freq: Once | ORAL | Status: AC
  Administered 2021-05-09: 650 mg via ORAL
  Filled 2021-05-09: qty 2

## 2021-05-09 NOTE — ED Notes (Signed)
T 103.2-pt states she was unaware of fever-tylenol given

## 2021-05-09 NOTE — ED Provider Notes (Signed)
[facility] EMERGENCY DEPARTMENT Provider Note   CSN: [id number] Arrival date & time: 05/09/21  1925     History Chief Complaint  Patient presents with   Fever    [name] is a 60 y.o. female.  61 yo F with a chief complaints of sleepiness.  Patient states that she has been sleeping all day today and has been mildly fatigued.  Her husband eventually was able to wake her up and then she was a bit disoriented and so he brought her here for evaluation.  Patient was seen recently in the emergency department since that her brain was on fire and then she was having some other symptoms.  She has been suffering from tickborne illness in the past and is thought to have some chronic sequelae of that.  She was on 2 different rounds of doxycycline this month for the same.  She did note that she had a fever at home but its was found to have a fever here.  She denies cough or congestion denies abdominal pain nausea vomiting or diarrhea denies repeat tick exposure.  The history is provided by the patient.  Fever Associated symptoms: no chest pain, no chills, no congestion, no dysuria, no headaches, no myalgias, no nausea, no rhinorrhea and no vomiting   Illness Severity:  Moderate Onset quality:  Sudden Duration:  2 days Timing:  Intermittent Progression:  Waxing and waning Chronicity:  New Associated symptoms: fever   Associated symptoms: no chest pain, no congestion, no headaches, no myalgias, no nausea, no rhinorrhea, no shortness of breath, no vomiting and no wheezing       Past Medical History:  Diagnosis Date   BPPV (benign paroxysmal positional vertigo)    [facility] rehab ref-->07/15/18   Chronic headaches    Chronic renal insufficiency, stage 2 (mild)    borderlined II/III (GF@59 )   Depression    Diabetes mellitus ([place]) 10/2019 prediab; 04/2020 DM 2   Fasting gluc low 120s, Hba1c 6.3%. June 2021 A1c 7%   History of adenomatous polyp of colon 03/09/2014   tubular adenoma  2011    Hyperlipidemia 10/2019   10 yr [place] CV risk= 3.5%->TLC.  LDL 170s + new dx DM 04/2020->pt declines any statin b/c of myalgis on 2 diff ones.   Migraine    NASH (nonalcoholic steatohepatitis) 03/21/2010   Qualifier: Diagnosis of  By: [name] MD, [name]    OSA (obstructive sleep apnea) 10/14/2012   Sleep study completed on 09/22/2012-interpreting physician Clinton D. Young MD  Impression: 1. 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 2. Moderate scarring with oxygen saturations were -87% with mean saturation to the study night at 92% on room air. 3. Regular cardiac rhythm and the heart rate is 60 beats per minute.    [facility] spotted fever) 2017/18   Lyme neg   Sacroiliac pain    chronic, left (Dr. Othelia Pulling eval 04/2021->possible SI fusion surgery   Tobacco dependence    switched to vap 2017    Patient Active Problem List   Diagnosis Date Noted   Sun-damaged skin 08/04/2017   Peripheral tear of medial meniscus of left knee as current injury 01/04/2017   Common migraine with intractable migraine 05/11/2016   Non-intractable cyclical vomiting without nausea 08/28/2015   Carbuncle, thigh 12/19/2014   Personal history of colonic polyps 03/09/2014   Nausea and vomiting 03/09/2014   Abdominal distension - diffuse 03/09/2014   Obesity (BMI 30-39.9) 03/09/2014   Tobacco  abuse 03/09/2014   Skin lesion of cheek 09/08/2013   Low back pain 08/21/2013   OSA (obstructive sleep apnea) 10/14/2012   Hyperlipidemia 09/07/2012   Chronic fatigue 08/16/2012   Nonspecific elevation of levels of transaminase or lactic acid dehydrogenase (LDH) 08/16/2012   Tobacco use 08/16/2012   NASH (nonalcoholic steatohepatitis) 03/21/2010   IBS (irritable bowel syndrome) 11/26/2009    Past Surgical History:  Procedure Laterality Date   ABDOMINAL HYSTERECTOMY  2010   [name]   CAROTID DOPPLERS Bilateral 07/24/2020   NORMAL   cataract surg     2021   CHOLECYSTECTOMY  N/A 03/10/2014   Procedure: LAPAROSCOPIC CHOLECYSTECTOMY WITH INTRAOPERATIVE CHOLANGIOGRAM;  Surgeon: [name], MD;  Location: [facility];  Service: General;  Laterality: N/A;   COLONOSCOPY W/ BIOPSIES  2011; 03/2014   Dr [name] 2011.  [name] 2015 (polyps-->recall 5 yrs).   ESOPHAGOGASTRODUODENOSCOPY  2011   Dr [name]   LAPAROSCOPIC APPENDECTOMY  2009   [name]   RHYTHM MONITORING  07/2020   normal   TEMPOROMANDIBULAR JOINT ARTHROPLASTY     TEMPOROMANDIBULAR JOINT SURGERY     TONSILLECTOMY     TRANSTHORACIC ECHOCARDIOGRAM  08/2020   COMPLETELY NORMAL     OB History   No obstetric history on file.     Family History  Problem Relation Age of Onset   Heart disease Father    Hypertension Father    Hemochromatosis Father    Diabetes Mother    Diabetes Brother    Hyperlipidemia Other    Arthritis Other    Sudden death Other    Alcohol abuse Maternal Grandfather     Social History   Tobacco Use   Smoking status: Former    Packs/day: 1.50    Years: 32.00    Pack years: 48.00    Types: Cigarettes    Quit date: 08/01/2016    Years since quitting: 4.7   Smokeless tobacco: Never  Vaping Use   Vaping Use: Every day   Start date: 04/09/2016  Substance Use Topics   Alcohol use: Yes    Alcohol/week: 0.0 standard drinks    Comment: occassional   Drug use: No    Home Medications Prior to Admission medications   Medication Sig Start Date End Date Taking? Authorizing Provider  Acetaminophen-Codeine 300-30 MG tablet Take 1-2 tablets by mouth every 6 (six) hours as needed. 04/22/21   [provider]  ALPRAZolam Duanne Moron) 0.5 MG tablet Take 2 tablets approximately 45 minutes prior to the MRI study, take a third tablet if needed. Patient not taking: Reported on 05/01/2021 03/05/21   Kathrynn Ducking, MD  aspirin 81 MG chewable tablet Chew 81 mg by mouth daily.    [provider]  cyclobenzaprine (FLEXERIL) 5 MG tablet Take 5-10 mg by mouth at bedtime as needed.  04/22/21   [provider]  diazepam (VALIUM) 5 MG tablet Take by mouth. Patient not taking: Reported on 05/01/2021 04/22/21   [provider]  estradiol (ESTRACE) 0.1 MG/GM vaginal cream  12/04/20   [provider]  gabapentin (NEURONTIN) 300 MG capsule 1 capsule twice daily for 1 week, then take 1 in the morning and 2 in the evening 04/22/21   Kathrynn Ducking, MD  hydrOXYzine (ATARAX/VISTARIL) 25 MG tablet Take 1 tablet (25 mg total) by mouth every 6 (six) hours. Patient not taking: Reported on 05/01/2021 04/29/21   Alfredia Client, PA-C  methocarbamol (ROBAXIN) 500 MG tablet Take 1 tablet  by mouth every 6 (six) hours as needed. Patient not taking: Reported on 05/01/2021 04/22/21   [provider]  mupirocin ointment (BACTROBAN) 2 % Apply 1 application topically 2 (two) times daily. 02/13/21   [provider]  nitrofurantoin, macrocrystal-monohydrate, (MACROBID) 100 MG capsule Take 1 capsule (100 mg total) by mouth 2 (two) times daily. 04/29/21   Alfredia Client, PA-C    Allergies    Amoxicillin-pot clavulanate, Bee venom, and Shellfish allergy  Review of Systems   Review of Systems  Constitutional:  Positive for fever. Negative for chills.  HENT:  Negative for congestion and rhinorrhea.   Eyes:  Negative for redness and visual disturbance.  Respiratory:  Negative for shortness of breath and wheezing.   Cardiovascular:  Negative for chest pain and palpitations.  Gastrointestinal:  Negative for nausea and vomiting.  Genitourinary:  Negative for dysuria and urgency.  Musculoskeletal:  Negative for arthralgias and myalgias.  Skin:  Negative for pallor and wound.  Neurological:  Negative for dizziness and headaches.   Physical Exam Updated Vital Signs BP 108/74 (BP Location: Right Arm)   Pulse (!) 106   Temp 100.1 F (37.8 C) (Oral)   Resp 20   SpO2 94%   Physical Exam Vitals and nursing note reviewed.  Constitutional:      General: She is not in  acute distress.    Appearance: She is well-developed. She is not diaphoretic.  HENT:     Head: Normocephalic and atraumatic.  Eyes:     Pupils: Pupils are equal, round, and reactive to light.  Cardiovascular:     Rate and Rhythm: Regular rhythm. Tachycardia present.     Heart sounds: No murmur heard.   No friction rub. No gallop.  Pulmonary:     Effort: Pulmonary effort is normal.     Breath sounds: No wheezing or rales.  Abdominal:     General: There is no distension.     Palpations: Abdomen is soft.     Tenderness: There is no abdominal tenderness.  Musculoskeletal:        General: No tenderness.     Cervical back: Normal range of motion and neck supple.  Skin:    General: Skin is warm and dry.  Neurological:     Mental Status: She is alert and oriented to person, place, and time.  Psychiatric:        Behavior: Behavior normal.    ED Results / Procedures / Treatments   Labs (all labs ordered are listed, but only abnormal results are displayed) Labs Reviewed  RESP PANEL BY RT-PCR (FLU A&B, COVID) [id number]    EKG None  Radiology No results found.  Procedures Procedures   Medications Ordered in ED Medications  acetaminophen (TYLENOL) tablet 650 mg (650 mg Oral Given 05/09/21 1949)    ED Course  I have reviewed the triage vital signs and the nursing notes.  Pertinent labs & imaging results that were available during my care of the patient were reviewed by me and considered in my medical decision making (see chart for details).    MDM Rules/Calculators/A&P                          60 yo F with a chief complaints of increased sleepiness and incidentally here found to have a temperature of 103.  Patient is well-appearing and nontoxic.  Clear lung sounds.  No abdominal tenderness.  No rashes.  I am unsure of the  exact cause of her fever.  Could be viral.  I did offer to retreat her with doxycycline which she is declining.  COVID swab sent and negative.  PCP  follow-up.  11:48 PM:  I have discussed the diagnosis/risks/treatment options with the patient and believe the pt to be eligible for discharge home to follow-up with PCP. We also discussed returning to the ED immediately if new or worsening sx occur. We discussed the sx which are most concerning (e.g., sudden worsening pain, fever, inability to tolerate by mouth) that necessitate immediate return. Medications administered to the patient during their visit and any new prescriptions provided to the patient are listed below.  Medications given during this visit Medications  acetaminophen (TYLENOL) tablet 650 mg (650 mg Oral Given 05/09/21 1949)     The patient appears reasonably screen and/or stabilized for discharge and I doubt any other medical condition or other [facility] requiring further screening, evaluation, or treatment in the ED at this time prior to discharge.   Final Clinical Impression(s) / ED Diagnoses Final diagnoses:  Fever in adult  Fatigue, unspecified type    Rx / DC Orders ED Discharge Orders     None        Deno Etienne, DO 05/09/21 2348

## 2021-05-09 NOTE — Discharge Instructions (Addendum)
Take tylenol 2 pills 4 times a day and motrin 4 pills 3 times a day.  Drink plenty of fluids.  Return for worsening shortness of breath, headache, confusion. Follow up with your family doctor.   

## 2021-05-09 NOTE — ED Triage Notes (Signed)
Pt states "I'm only here because my husband made me come in"-c/o "sleeping all day"-reports she was difficult to wake-c/o dizziness since waking ~5pm-NAD-to triage in w/c-NAD-pt screaming when BP attempt to left arm

## 2021-05-14 ENCOUNTER — Other Ambulatory Visit: Payer: Self-pay

## 2021-05-14 ENCOUNTER — Ambulatory Visit
Admission: RE | Admit: 2021-05-14 | Discharge: 2021-05-14 | Disposition: A | Discharge status: Home / Self Care | Payer: BC Managed Care – PPO | Source: Ambulatory Visit | Attending: Orthopedic Surgery | Admitting: Orthopedic Surgery

## 2021-05-14 DIAGNOSIS — M5416 Radiculopathy, lumbar region: Secondary | ICD-10-CM

## 2021-05-14 DIAGNOSIS — M545 Low back pain, unspecified: Secondary | ICD-10-CM | POA: Diagnosis not present

## 2021-05-21 DIAGNOSIS — E119 Type 2 diabetes mellitus without complications: Secondary | ICD-10-CM | POA: Diagnosis not present

## 2021-05-21 DIAGNOSIS — G4733 Obstructive sleep apnea (adult) (pediatric): Secondary | ICD-10-CM | POA: Diagnosis not present

## 2021-05-21 DIAGNOSIS — R519 Headache, unspecified: Secondary | ICD-10-CM | POA: Diagnosis not present

## 2021-05-21 DIAGNOSIS — K760 Fatty (change of) liver, not elsewhere classified: Secondary | ICD-10-CM | POA: Diagnosis not present

## 2021-05-22 DIAGNOSIS — Z8619 Personal history of other infectious and parasitic diseases: Secondary | ICD-10-CM | POA: Diagnosis not present

## 2021-05-22 DIAGNOSIS — K909 Intestinal malabsorption, unspecified: Secondary | ICD-10-CM | POA: Diagnosis not present

## 2021-05-22 DIAGNOSIS — R5383 Other fatigue: Secondary | ICD-10-CM | POA: Diagnosis not present

## 2021-05-22 DIAGNOSIS — Z720 Tobacco use: Secondary | ICD-10-CM | POA: Diagnosis not present

## 2021-05-30 ENCOUNTER — Other Ambulatory Visit: Payer: Self-pay | Admitting: Orthopedic Surgery

## 2021-05-30 DIAGNOSIS — M533 Sacrococcygeal disorders, not elsewhere classified: Secondary | ICD-10-CM | POA: Diagnosis not present

## 2021-05-30 DIAGNOSIS — M25511 Pain in right shoulder: Secondary | ICD-10-CM | POA: Diagnosis not present

## 2021-05-30 DIAGNOSIS — M5416 Radiculopathy, lumbar region: Secondary | ICD-10-CM

## 2021-06-04 ENCOUNTER — Encounter: Payer: Self-pay | Admitting: Family Medicine

## 2021-06-08 ENCOUNTER — Ambulatory Visit
Admission: RE | Admit: 2021-06-08 | Discharge: 2021-06-08 | Disposition: A | Discharge status: Home / Self Care | Payer: BC Managed Care – PPO | Source: Ambulatory Visit | Attending: Orthopedic Surgery | Admitting: Orthopedic Surgery

## 2021-06-08 DIAGNOSIS — M4726 Other spondylosis with radiculopathy, lumbar region: Secondary | ICD-10-CM | POA: Diagnosis not present

## 2021-06-08 DIAGNOSIS — M5416 Radiculopathy, lumbar region: Secondary | ICD-10-CM

## 2021-06-08 DIAGNOSIS — M4727 Other spondylosis with radiculopathy, lumbosacral region: Secondary | ICD-10-CM | POA: Diagnosis not present

## 2021-06-08 DIAGNOSIS — M25511 Pain in right shoulder: Secondary | ICD-10-CM

## 2021-06-08 DIAGNOSIS — M4316 Spondylolisthesis, lumbar region: Secondary | ICD-10-CM | POA: Diagnosis not present

## 2021-06-08 DIAGNOSIS — M5116 Intervertebral disc disorders with radiculopathy, lumbar region: Secondary | ICD-10-CM | POA: Diagnosis not present

## 2021-06-17 DIAGNOSIS — M545 Low back pain, unspecified: Secondary | ICD-10-CM | POA: Diagnosis not present

## 2021-06-17 DIAGNOSIS — M533 Sacrococcygeal disorders, not elsewhere classified: Secondary | ICD-10-CM | POA: Diagnosis not present

## 2021-06-18 DIAGNOSIS — M75111 Incomplete rotator cuff tear or rupture of right shoulder, not specified as traumatic: Secondary | ICD-10-CM | POA: Diagnosis not present

## 2021-06-30 ENCOUNTER — Other Ambulatory Visit: Payer: Self-pay

## 2021-06-30 ENCOUNTER — Ambulatory Visit: Payer: BC Managed Care – PPO | Admitting: Pulmonary Disease

## 2021-06-30 ENCOUNTER — Encounter: Payer: Self-pay | Admitting: Pulmonary Disease

## 2021-06-30 VITALS — BP 118/64 | HR 69 | Temp 98.1°F | Ht 65.0 in | Wt 182.0 lb

## 2021-06-30 DIAGNOSIS — R0602 Shortness of breath: Secondary | ICD-10-CM | POA: Diagnosis not present

## 2021-06-30 NOTE — Progress Notes (Signed)
Synopsis: Referred in August 2022 for Shortness of breath by Irene Pap, DO  Subjective:   PATIENT ID: [name] GENDER: female DOB: 06-Mar-1961, MRN: [id number]  HPI  Chief Complaint  Patient presents with   Consult    Referred by Dr. Raquel Sarna, feel like she has to gasp for air, mostly when sitting.   Merlean Pizzini is a 60 year old woman, former smoker and current vaping with history of diabetes mellitus, anxiety/depression, and TIA who is referred to pulmonary for shortness of breath.   She reports increased shortness of breath over recent months.  She has the shortness of breath at rest and is taking large gasping breaths for air.  She was seen by her primary care doctor labs off on 05/21/2021 where she was provided an albuterol inhaler but has not tried this medication.  She denies any cough, chest tightness or wheezing with the shortness of breath.  She denies any history of COVID-19 infection.  She is not waking up in her sleep with shortness of breath.  She was recently diagnosed with hypothyroidism and is being treated at the [facility] with [place] Thyroid 30 MCG daily.  She does report increased anxiety over recent months which could be contributing to her breathing.  She is a former smoker with a 48-pack year history and she quit in 2017.  She has been vaping since 2018 and continues to vape daily.  She does report some weight gain over the past year as well which could be contributing to her shortness of breath.  Past Medical History:  Diagnosis Date   BPPV (benign paroxysmal positional vertigo)    [facility] rehab ref-->07/15/18   Chronic headaches    Chronic low back pain    multilevel facet arthropathy   Chronic renal insufficiency, stage 2 (mild)    borderlined II/III (GF@59 )   Depression    Diabetes mellitus ([place]) 10/2019 prediab; 04/2020 DM 2   Fasting gluc low 120s, Hba1c 6.3%. June 2021 A1c 7%   History of adenomatous polyp of colon  03/09/2014   tubular adenoma 2011    Hyperlipidemia 10/2019   10 yr [place] CV risk= 3.5%->TLC.  LDL 170s + new dx DM 04/2020->pt declines any statin b/c of myalgis on 2 diff ones.   Migraine    NASH (nonalcoholic steatohepatitis) 03/21/2010   Qualifier: Diagnosis of  By: [name] MD, [name]    OSA (obstructive sleep apnea) 10/14/2012   Sleep study completed on 09/22/2012-interpreting physician Clinton D. Young MD  Impression: 1. 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 2. Moderate scarring with oxygen saturations were -87% with mean saturation to the study night at 92% on room air. 3. Regular cardiac rhythm and the heart rate is 60 beats per minute.    RMS[facility] spotted fever) 2017/18   Lyme neg   Sacroiliac pain    chronic, left (Dr. Othelia Pulling eval 04/2021->SI injection helpful.   Tobacco dependence    switched to vap 2017     Family History  Problem Relation Age of Onset   Heart disease Father    Hypertension Father    Hemochromatosis Father    Diabetes Mother    Diabetes Brother    Hyperlipidemia Other    Arthritis Other    Sudden death Other    Alcohol abuse Maternal Grandfather      Social History   Socioeconomic History   Marital status: Married    Spouse name: Not on  file   Number of children: 0   Years of education: College   Highest education level: Not on file  Occupational History   Occupation: Full Time Mudlogger of compensation and benefits    Comment: [employer]  Tobacco Use   Smoking status: Former    Packs/day: 1.50    Years: 32.00    Pack years: 48.00    Types: Cigarettes    Quit date: 08/01/2016    Years since quitting: 4.9   Smokeless tobacco: Never  Vaping Use   Vaping Use: Every day   Start date: 04/09/2016  Substance and Sexual Activity   Alcohol use: Yes    Alcohol/week: 0.0 standard drinks    Comment: occassional   Drug use: No   Sexual activity: Yes    Birth control/protection: Surgical  Other  Topics Concern   Not on file  Social History Narrative   Lives with husband   Right Handed   Drinks average 10 cups caffeine daily   Social Determinants of Health   Financial Resource Strain: Not on file  Food Insecurity: Not on file  Transportation Needs: Not on file  Physical Activity: Not on file  Stress: Not on file  Social Connections: Not on file  Intimate Partner Violence: Not on file     Allergies  Allergen Reactions   Amoxicillin-Pot Clavulanate Hives, Rash, Other (See Comments) and Hypertension    Extreme headache Has patient had a PCN reaction causing immediate rash, facial/tongue/throat swelling, SOB or lightheadedness with hypotension: Yes Has patient had a PCN reaction causing severe rash involving mucus membranes or skin necrosis: Yes  Has patient had a PCN reaction that required hospitalization No Has patient had a PCN reaction occurring within the last 10 years: Yes - 2010 If all of the above answers are "NO", then may proceed with Cephalosporin use.    Bee Venom Swelling    Swelling at the site    Shellfish Allergy Swelling    joints     Outpatient Medications Prior to Visit  Medication Sig Dispense Refill   Acetaminophen-Codeine 300-30 MG tablet Take 1-2 tablets by mouth every 6 (six) hours as needed.     cyclobenzaprine (FLEXERIL) 5 MG tablet Take 5-10 mg by mouth at bedtime as needed.     diazepam (VALIUM) 5 MG tablet Take by mouth.     aspirin 81 MG chewable tablet Chew 81 mg by mouth daily. (Patient not taking: No sig reported)     estradiol (ESTRACE) 0.1 MG/GM vaginal cream  (Patient not taking: No sig reported)     gabapentin (NEURONTIN) 300 MG capsule 1 capsule twice daily for 1 week, then take 1 in the morning and 2 in the evening (Patient not taking: Reported on 06/30/2021) 90 capsule 3   hydrOXYzine (ATARAX/VISTARIL) 25 MG tablet Take 1 tablet (25 mg total) by mouth every 6 (six) hours. (Patient not taking: No sig reported) 12 tablet 0    methocarbamol (ROBAXIN) 500 MG tablet Take 1 tablet by mouth every 6 (six) hours as needed. (Patient not taking: No sig reported)     mupirocin ointment (BACTROBAN) 2 % Apply 1 application topically 2 (two) times daily. (Patient not taking: Reported on 06/30/2021)     nitrofurantoin, macrocrystal-monohydrate, (MACROBID) 100 MG capsule Take 1 capsule (100 mg total) by mouth 2 (two) times daily. (Patient not taking: Reported on 06/30/2021) 10 capsule 0   ALPRAZolam (XANAX) 0.5 MG tablet Take 2 tablets approximately 45 minutes prior to the MRI  study, take a third tablet if needed. (Patient not taking: No sig reported) 3 tablet 0   No facility-administered medications prior to visit.    Review of Systems  Constitutional:  Negative for chills, fever, malaise/fatigue and weight loss.  HENT:  Negative for congestion, sinus pain and sore throat.   Eyes: Negative.   Respiratory:  Positive for shortness of breath. Negative for cough, hemoptysis, sputum production and wheezing.   Cardiovascular:  Negative for chest pain, palpitations, orthopnea, claudication and leg swelling.  Gastrointestinal:  Negative for abdominal pain, heartburn, nausea and vomiting.  Genitourinary: Negative.   Musculoskeletal:  Negative for joint pain and myalgias.  Skin:  Negative for rash.  Neurological:  Positive for headaches. Negative for weakness.  Endo/Heme/Allergies: Negative.   Psychiatric/Behavioral:  Positive for depression. The patient is nervous/anxious.    Objective:   Vitals:   06/30/21 1451  BP: 118/64  Pulse: 69  Temp: 98.1 F (36.7 C)  TempSrc: Oral  SpO2: 98%  Weight: 182 lb (82.6 kg)  Height: 5' 5"  (1.651 m)     Physical Exam Constitutional:      General: She is not in acute distress.    Appearance: She is obese. She is not ill-appearing.  HENT:     Head: Normocephalic and atraumatic.  Eyes:     General: No scleral icterus.    Conjunctiva/sclera: Conjunctivae normal.     Pupils: Pupils are  equal, round, and reactive to light.  Cardiovascular:     Rate and Rhythm: Normal rate and regular rhythm.     Pulses: Normal pulses.     Heart sounds: Normal heart sounds. No murmur heard. Pulmonary:     Effort: Pulmonary effort is normal.     Breath sounds: Normal breath sounds. No wheezing, rhonchi or rales.  Abdominal:     General: Bowel sounds are normal.     Palpations: Abdomen is soft.  Musculoskeletal:     Right lower leg: No edema.     Left lower leg: No edema.  Lymphadenopathy:     Cervical: No cervical adenopathy.  Skin:    General: Skin is warm and dry.  Neurological:     General: No focal deficit present.     Mental Status: She is alert.  Psychiatric:        Mood and Affect: Mood normal.        Behavior: Behavior normal.        Thought Content: Thought content normal.        Judgment: Judgment normal.    CBC    Component Value Date/Time   WBC 8.4 04/29/2021 1555   RBC 4.36 04/29/2021 1555   HGB 13.7 04/29/2021 1555   HCT 39.9 04/29/2021 1555   PLT 241 04/29/2021 1555   MCV 91.5 04/29/2021 1555   MCH 31.4 04/29/2021 1555   MCHC 34.3 04/29/2021 1555   RDW 12.8 04/29/2021 1555   LYMPHSABS 3.3 04/29/2021 1555   MONOABS 0.4 04/29/2021 1555   EOSABS 0.1 04/29/2021 1555   BASOSABS 0.0 04/29/2021 1555   Chest imaging: CXR 04/29/21 The heart size and mediastinal contours are within normal limits. Both lungs are clear. The visualized skeletal structures are unremarkable.  PFT: No flowsheet data found.  Echo 08/09/20: LVEF 60-65%. RV systolic function and size are normal. No valvular issues.   Heart Catheterization:  Sleep Study 2013: AHI 7.1/hr, mild OSA  Assessment & Plan:   Shortness of breath - Plan: Pulmonary Function Test, CT Chest High Resolution, Blood  Gas, Arterial  Discussion: Taralyn Ferraiolo is a 60 year old woman, former smoker and current vaping with history of diabetes mellitus, anxiety/depression, and TIA who is referred to pulmonary for  shortness of breath.   She does have significant smoking history as well as current vaping use which places her at risk for obstructive lung disease and vaping related lung injury conditions.  We will check pulmonary function tests and a high-resolution CT chest scan to evaluate her shortness of breath further.  We will also check an ABG to evaluate for any hyperventilation due to underlying anxiety.  I have instructed her to try the albuterol inhaler 1 to 2 puffs every 4-6 hours as needed that was provided to her by her primary care physician.  Based on her pulmonary function test and CT scan we will decide on further changes to her inhaler therapy.  Follow-up in 6 weeks.  Freda Jackson, MD [facility] Office: [phone]   Current Outpatient Medications:    Acetaminophen-Codeine 300-30 MG tablet, Take 1-2 tablets by mouth every 6 (six) hours as needed., Disp: , Rfl:    cyclobenzaprine (FLEXERIL) 5 MG tablet, Take 5-10 mg by mouth at bedtime as needed., Disp: , Rfl:    diazepam (VALIUM) 5 MG tablet, Take by mouth., Disp: , Rfl:    aspirin 81 MG chewable tablet, Chew 81 mg by mouth daily. (Patient not taking: No sig reported), Disp: , Rfl:    estradiol (ESTRACE) 0.1 MG/GM vaginal cream, , Disp: , Rfl:    gabapentin (NEURONTIN) 300 MG capsule, 1 capsule twice daily for 1 week, then take 1 in the morning and 2 in the evening (Patient not taking: Reported on 06/30/2021), Disp: 90 capsule, Rfl: 3   hydrOXYzine (ATARAX/VISTARIL) 25 MG tablet, Take 1 tablet (25 mg total) by mouth every 6 (six) hours. (Patient not taking: No sig reported), Disp: 12 tablet, Rfl: 0   methocarbamol (ROBAXIN) 500 MG tablet, Take 1 tablet by mouth every 6 (six) hours as needed. (Patient not taking: No sig reported), Disp: , Rfl:    mupirocin ointment (BACTROBAN) 2 %, Apply 1 application topically 2 (two) times daily. (Patient not taking: Reported on 06/30/2021), Disp: , Rfl:    nitrofurantoin,  macrocrystal-monohydrate, (MACROBID) 100 MG capsule, Take 1 capsule (100 mg total) by mouth 2 (two) times daily. (Patient not taking: Reported on 06/30/2021), Disp: 10 capsule, Rfl: 0

## 2021-06-30 NOTE — Patient Instructions (Addendum)
We will check pulmonary function tests and a high resolution CT chest scan for your shortness of breath.   We will also check an arterial blood gas for your shortness of breath.  Try albuterol 1-2 puffs every 4-6 hours as needed for the shortness of breath.

## 2021-07-01 DIAGNOSIS — M47816 Spondylosis without myelopathy or radiculopathy, lumbar region: Secondary | ICD-10-CM | POA: Diagnosis not present

## 2021-07-05 ENCOUNTER — Encounter: Payer: Self-pay | Admitting: Pulmonary Disease

## 2021-07-16 ENCOUNTER — Ambulatory Visit: Payer: BC Managed Care – PPO | Admitting: Adult Health

## 2021-07-18 ENCOUNTER — Other Ambulatory Visit: Payer: BC Managed Care – PPO

## 2021-07-21 DIAGNOSIS — Z8619 Personal history of other infectious and parasitic diseases: Secondary | ICD-10-CM | POA: Diagnosis not present

## 2021-07-21 DIAGNOSIS — E538 Deficiency of other specified B group vitamins: Secondary | ICD-10-CM | POA: Diagnosis not present

## 2021-07-21 DIAGNOSIS — R5383 Other fatigue: Secondary | ICD-10-CM | POA: Diagnosis not present

## 2021-07-21 DIAGNOSIS — Z7409 Other reduced mobility: Secondary | ICD-10-CM | POA: Diagnosis not present

## 2021-07-21 DIAGNOSIS — K909 Intestinal malabsorption, unspecified: Secondary | ICD-10-CM | POA: Diagnosis not present

## 2021-07-21 DIAGNOSIS — E039 Hypothyroidism, unspecified: Secondary | ICD-10-CM | POA: Diagnosis not present

## 2021-07-21 DIAGNOSIS — E559 Vitamin D deficiency, unspecified: Secondary | ICD-10-CM | POA: Diagnosis not present

## 2021-07-21 DIAGNOSIS — Z720 Tobacco use: Secondary | ICD-10-CM | POA: Diagnosis not present

## 2021-07-22 DIAGNOSIS — M47816 Spondylosis without myelopathy or radiculopathy, lumbar region: Secondary | ICD-10-CM | POA: Diagnosis not present

## 2021-08-01 ENCOUNTER — Ambulatory Visit
Admission: RE | Admit: 2021-08-01 | Discharge: 2021-08-01 | Disposition: A | Discharge status: Home / Self Care | Payer: BC Managed Care – PPO | Source: Ambulatory Visit | Attending: Pulmonary Disease | Admitting: Pulmonary Disease

## 2021-08-01 DIAGNOSIS — R0602 Shortness of breath: Secondary | ICD-10-CM

## 2021-08-01 DIAGNOSIS — I700 Atherosclerosis of aorta: Secondary | ICD-10-CM | POA: Diagnosis not present

## 2021-08-04 DIAGNOSIS — Z720 Tobacco use: Secondary | ICD-10-CM | POA: Diagnosis not present

## 2021-08-04 DIAGNOSIS — K909 Intestinal malabsorption, unspecified: Secondary | ICD-10-CM | POA: Diagnosis not present

## 2021-08-04 DIAGNOSIS — R5383 Other fatigue: Secondary | ICD-10-CM | POA: Diagnosis not present

## 2021-08-04 DIAGNOSIS — E039 Hypothyroidism, unspecified: Secondary | ICD-10-CM | POA: Diagnosis not present

## 2021-08-15 DIAGNOSIS — M47816 Spondylosis without myelopathy or radiculopathy, lumbar region: Secondary | ICD-10-CM | POA: Diagnosis not present

## 2021-08-15 DIAGNOSIS — M533 Sacrococcygeal disorders, not elsewhere classified: Secondary | ICD-10-CM | POA: Diagnosis not present

## 2021-08-19 ENCOUNTER — Ambulatory Visit: Payer: BC Managed Care – PPO | Admitting: Pulmonary Disease

## 2021-08-20 DIAGNOSIS — D485 Neoplasm of uncertain behavior of skin: Secondary | ICD-10-CM | POA: Diagnosis not present

## 2021-08-20 DIAGNOSIS — L814 Other melanin hyperpigmentation: Secondary | ICD-10-CM | POA: Diagnosis not present

## 2021-08-20 DIAGNOSIS — L570 Actinic keratosis: Secondary | ICD-10-CM | POA: Diagnosis not present

## 2021-08-20 DIAGNOSIS — L821 Other seborrheic keratosis: Secondary | ICD-10-CM | POA: Diagnosis not present

## 2021-08-20 DIAGNOSIS — D225 Melanocytic nevi of trunk: Secondary | ICD-10-CM | POA: Diagnosis not present

## 2021-08-20 DIAGNOSIS — Z85828 Personal history of other malignant neoplasm of skin: Secondary | ICD-10-CM | POA: Diagnosis not present

## 2021-09-02 DIAGNOSIS — D225 Melanocytic nevi of trunk: Secondary | ICD-10-CM | POA: Diagnosis not present

## 2021-09-02 DIAGNOSIS — D485 Neoplasm of uncertain behavior of skin: Secondary | ICD-10-CM | POA: Diagnosis not present

## 2021-09-09 DIAGNOSIS — M533 Sacrococcygeal disorders, not elsewhere classified: Secondary | ICD-10-CM | POA: Diagnosis not present

## 2021-09-13 DIAGNOSIS — M533 Sacrococcygeal disorders, not elsewhere classified: Secondary | ICD-10-CM | POA: Diagnosis not present

## 2021-09-16 ENCOUNTER — Other Ambulatory Visit: Payer: Self-pay

## 2021-09-16 ENCOUNTER — Encounter: Payer: Self-pay | Admitting: Pulmonary Disease

## 2021-09-16 ENCOUNTER — Other Ambulatory Visit: Payer: Self-pay | Admitting: Orthopedic Surgery

## 2021-09-16 ENCOUNTER — Ambulatory Visit: Payer: BC Managed Care – PPO | Admitting: Pulmonary Disease

## 2021-09-16 ENCOUNTER — Ambulatory Visit (INDEPENDENT_AMBULATORY_CARE_PROVIDER_SITE_OTHER): Payer: BC Managed Care – PPO | Admitting: Pulmonary Disease

## 2021-09-16 VITALS — BP 122/70 | HR 82 | Ht 65.0 in | Wt 184.0 lb

## 2021-09-16 DIAGNOSIS — M5459 Other low back pain: Secondary | ICD-10-CM

## 2021-09-16 DIAGNOSIS — J984 Other disorders of lung: Secondary | ICD-10-CM

## 2021-09-16 DIAGNOSIS — R0602 Shortness of breath: Secondary | ICD-10-CM

## 2021-09-16 LAB — PULMONARY FUNCTION TEST
DL/VA % pred: 84 %
DL/VA: 3.53 ml/min/mmHg/L
DLCO cor % pred: 80 %
DLCO cor: 16.77 ml/min/mmHg
DLCO unc % pred: 80 %
DLCO unc: 16.77 ml/min/mmHg
FEF 25-75 Post: 3.15 L/sec
FEF 25-75 Pre: 3.11 L/sec
FEF2575-%Change-Post: 1 %
FEF2575-%Pred-Post: 133 %
FEF2575-%Pred-Pre: 131 %
FEV1-%Change-Post: 1 %
FEV1-%Pred-Post: 106 %
FEV1-%Pred-Pre: 105 %
FEV1-Post: 2.79 L
FEV1-Pre: 2.76 L
FEV1FVC-%Change-Post: 1 %
FEV1FVC-%Pred-Pre: 106 %
FEV6-%Change-Post: 0 %
FEV6-%Pred-Post: 100 %
FEV6-%Pred-Pre: 101 %
FEV6-Post: 3.3 L
FEV6-Pre: 3.32 L
FEV6FVC-%Pred-Post: 103 %
FEV6FVC-%Pred-Pre: 103 %
FVC-%Change-Post: 0 %
FVC-%Pred-Post: 96 %
FVC-%Pred-Pre: 97 %
FVC-Post: 3.3 L
FVC-Pre: 3.32 L
Post FEV1/FVC ratio: 85 %
Post FEV6/FVC ratio: 100 %
Pre FEV1/FVC ratio: 83 %
Pre FEV6/FVC Ratio: 100 %
RV % pred: 113 %
RV: 2.34 L
TLC % pred: 111 %
TLC: 5.81 L

## 2021-09-16 MED ORDER — FLUTICASONE FUROATE-VILANTEROL 100-25 MCG/ACT IN AEPB: 1.0000 | INHALATION_SPRAY | Freq: Every day | RESPIRATORY_TRACT | 6 refills | Status: DC

## 2021-09-16 MED ORDER — FLUTICASONE FUROATE-VILANTEROL 100-25 MCG/ACT IN AEPB: 1.0000 | INHALATION_SPRAY | Freq: Every day | RESPIRATORY_TRACT | 0 refills | Status: DC

## 2021-09-16 NOTE — Progress Notes (Signed)
Full PFT performed today. °

## 2021-09-16 NOTE — Progress Notes (Signed)
Synopsis: Referred in August 2022 for Shortness of breath by Irene Pap, DO  Subjective:   PATIENT ID: [name] GENDER: female DOB: [birth year], MRN: [id number]  HPI  Chief Complaint  Patient presents with   Follow-up    6wk f/u after PFT. States she is still having episodes of SOB. Denies any new symptoms.    [name] is a 60 year old woman, former smoker and current vaping with history of diabetes mellitus, anxiety/depression, and TIA who returns to pulmonary clinic for shortness of breath.   Patient had high-resolution CT chest scan which did not show any abnormalities of the airways or lung parenchyma.  There was concern for small airways disease based on mild air trapping on expiratory imaging.  Pulmonary function test today are within normal limits.  Patient continues to experience intermittent shortness of breath.  She denies any cough or wheezing.  She continues to vape on a regular basis.  OV 06/30/21 She reports increased shortness of breath over recent months.  She has the shortness of breath at rest and is taking large gasping breaths for air.  She was seen by her primary care doctor labs off on 05/21/2021 where she was provided an albuterol inhaler but has not tried this medication.  She denies any cough, chest tightness or wheezing with the shortness of breath.  She denies any history of COVID-19 infection.  She is not waking up in her sleep with shortness of breath.  She was recently diagnosed with hypothyroidism and is being treated at the [facility] with Armour Thyroid 30 MCG daily.  She does report increased anxiety over recent months which could be contributing to her breathing.  She is a former smoker with a 48-pack year history and she quit in 2017.  She has been vaping since 2018 and continues to vape daily.  She does report some weight gain over the past year as well which could be contributing to her shortness of  breath.  Past Medical History:  Diagnosis Date   BPPV (benign paroxysmal positional vertigo)    [facility] rehab ref-->07/15/18   Chronic headaches    Chronic low back pain    multilevel facet arthropathy   Chronic renal insufficiency, stage 2 (mild)    borderlined II/III (GF@59 )   Depression    Diabetes mellitus ([place]) 10/2019 prediab; 04/2020 DM 2   Fasting gluc low 120s, Hba1c 6.3%. June 2021 A1c 7%   History of adenomatous polyp of colon 03/09/2014   tubular adenoma 2011    Hyperlipidemia 10/2019   10 yr [place] CV risk= 3.5%->TLC.  LDL 170s + new dx DM 04/2020->pt declines any statin b/c of myalgis on 2 diff ones.   Migraine    NASH (nonalcoholic steatohepatitis) 03/21/2010   Qualifier: Diagnosis of  By: [name] MD, [name]    OSA (obstructive sleep apnea) 10/14/2012   Sleep study completed on 09/22/2012-interpreting physician Clinton D. Young MD  Impression: 1. 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 2. Moderate scarring with oxygen saturations were -87% with mean saturation to the study night at 92% on room air. 3. Regular cardiac rhythm and the heart rate is 60 beats per minute.    [facility] spotted fever) 2017/18   Lyme neg   Sacroiliac pain    chronic, left (Dr. Othelia Pulling eval 04/2021->SI injection helpful.   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    Comment: [employer]  Tobacco Use    Smoking status: Former    Packs/day: 1.50    Years: 32.00    Pack years: 48.00    Types: Cigarettes    Quit date: 08/01/2016    Years since quitting: 5.1   Smokeless tobacco: Never  Vaping Use   Vaping Use: Every day   Start date: 04/09/2016  Substance and Sexual Activity   Alcohol use: Yes    Alcohol/week: 0.0 standard drinks    Comment: occassional   Drug use: No   Sexual activity: Yes    Birth control/protection: Surgical  Other Topics Concern   Not on file  Social History Narrative   Lives with husband   Right Handed   Drinks average 10 cups caffeine daily   Social Determinants of Health   Financial Resource Strain: Not on file  Food Insecurity: Not on file  Transportation Needs: Not on file  Physical Activity: Not on file  Stress: Not on file  Social Connections: Not on file  Intimate Partner Violence: Not on file     Allergies  Allergen Reactions   Amoxicillin-Pot Clavulanate Hives, Rash, Other (See Comments) and Hypertension    Extreme headache Has patient had a PCN reaction causing immediate rash, facial/tongue/throat swelling, SOB or lightheadedness with hypotension: Yes Has patient had a PCN reaction causing severe rash involving mucus membranes or skin necrosis: Yes  Has patient had a PCN reaction that required hospitalization No Has patient had a PCN reaction occurring within the last 10 years: Yes - 2010 If all of the above answers are "NO", then may proceed with Cephalosporin use.    Bee Venom Swelling    Swelling at the site    Shellfish Allergy Swelling    joints     Outpatient Medications Prior to Visit  Medication Sig Dispense Refill   Acetaminophen-Codeine 300-30 MG tablet Take 1-2 tablets by mouth every 6 (six) hours as needed.     ARMOUR THYROID 30 MG tablet Take 30 mg by mouth every morning.     cyclobenzaprine (FLEXERIL) 5 MG tablet Take 5-10 mg by mouth at bedtime as needed.     diazepam (VALIUM) 5 MG tablet Take by mouth.     aspirin 81  MG chewable tablet Chew 81 mg by mouth daily. (Patient not taking: No sig reported)     estradiol (ESTRACE) 0.1 MG/GM vaginal cream  (Patient not taking: No sig reported)     gabapentin (NEURONTIN) 300 MG capsule 1 capsule twice daily for 1 week, then take 1 in the morning and 2 in the evening (Patient not taking: Reported on 06/30/2021) 90 capsule 3   hydrOXYzine (ATARAX/VISTARIL) 25 MG tablet Take 1 tablet (25 mg total) by mouth every 6 (six) hours. (Patient not taking: No sig reported) 12 tablet 0   methocarbamol (ROBAXIN) 500 MG  tablet Take 1 tablet by mouth every 6 (six) hours as needed. (Patient not taking: No sig reported)     mupirocin ointment (BACTROBAN) 2 % Apply 1 application topically 2 (two) times daily. (Patient not taking: Reported on 06/30/2021)     nitrofurantoin, macrocrystal-monohydrate, (MACROBID) 100 MG capsule Take 1 capsule (100 mg total) by mouth 2 (two) times daily. (Patient not taking: Reported on 06/30/2021) 10 capsule 0   No facility-administered medications prior to visit.    Review of Systems  Constitutional:  Negative for chills, fever, malaise/fatigue and weight loss.  HENT:  Negative for congestion, sinus pain and sore throat.   Eyes: Negative.   Respiratory:  Positive for shortness of breath. Negative for cough, hemoptysis, sputum production and wheezing.   Cardiovascular:  Negative for chest pain, palpitations, orthopnea, claudication and leg swelling.  Gastrointestinal:  Negative for abdominal pain, heartburn, nausea and vomiting.  Genitourinary: Negative.   Musculoskeletal:  Negative for joint pain and myalgias.  Skin:  Negative for rash.  Neurological:  Negative for weakness.  Endo/Heme/Allergies: Negative.   Psychiatric/Behavioral:  Positive for depression. The patient is nervous/anxious.    Objective:   Vitals:   09/16/21 1331  BP: 122/70  Pulse: 82  SpO2: 98%  Weight: 184 lb (83.5 kg)  Height: 5' 5"  (1.651 m)     Physical  Exam Constitutional:      General: She is not in acute distress.    Appearance: She is not ill-appearing.  HENT:     Head: Normocephalic and atraumatic.  Eyes:     General: No scleral icterus.    Conjunctiva/sclera: Conjunctivae normal.     Pupils: Pupils are equal, round, and reactive to light.  Cardiovascular:     Rate and Rhythm: Normal rate and regular rhythm.     Pulses: Normal pulses.     Heart sounds: Normal heart sounds. No murmur heard. Pulmonary:     Effort: Pulmonary effort is normal.     Breath sounds: Decreased breath sounds present. No wheezing, rhonchi or rales.  Musculoskeletal:     Right lower leg: No edema.     Left lower leg: No edema.  Skin:    General: Skin is warm and dry.  Neurological:     General: No focal deficit present.     Mental Status: She is alert.    CBC    Component Value Date/Time   WBC 8.4 04/29/2021 1555   RBC 4.36 04/29/2021 1555   HGB 13.7 04/29/2021 1555   HCT 39.9 04/29/2021 1555   PLT 241 04/29/2021 1555   MCV 91.5 04/29/2021 1555   MCH 31.4 04/29/2021 1555   MCHC 34.3 04/29/2021 1555   RDW 12.8 04/29/2021 1555   LYMPHSABS 3.3 04/29/2021 1555   MONOABS 0.4 04/29/2021 1555   EOSABS 0.1 04/29/2021 1555   BASOSABS 0.0 04/29/2021 1555   Chest imaging: HRCT Chest 08/01/21 1. No findings to suggest interstitial lung disease. 2. Very mild air trapping indicative of mild small airways disease. 3. Aortic atherosclerosis, in addition to left main and 2 vessel coronary artery disease. Please note that although the presence of coronary artery calcium documents the presence of coronary artery disease, the severity of this disease and any potential stenosis cannot be assessed on this non-gated CT examination. Assessment for potential risk factor modification, dietary therapy or pharmacologic therapy may be warranted, if clinically indicated. 4. Hepatic steatosis.  CXR 04/29/21 The heart size and mediastinal contours are within normal  limits. Both lungs are  clear. The visualized skeletal structures are unremarkable.  PFT: PFT Results Latest Ref Rng & Units 09/16/2021  FVC-Pre L 3.32  FVC-Predicted Pre % 97  FVC-Post L 3.30  FVC-Predicted Post % 96  Pre FEV1/FVC % % 83  Post FEV1/FCV % % 85  FEV1-Pre L 2.76  FEV1-Predicted Pre % 105  FEV1-Post L 2.79  DLCO uncorrected ml/min/mmHg 16.77  DLCO UNC% % 80  DLCO corrected ml/min/mmHg 16.77  DLCO COR %Predicted % 80  DLVA Predicted % 84  TLC L 5.81  TLC % Predicted % 111  RV % Predicted % 113    Echo 08/09/20: LVEF 60-65%. RV systolic function and size are normal. No valvular issues.   Heart Catheterization:  Sleep Study 2013: AHI 7.1/hr, mild OSA  Assessment & Plan:   Small airways disease - Plan: fluticasone furoate-vilanterol (BREO ELLIPTA) 100-25 MCG/ACT AEPB  Shortness of breath  Discussion: [name] is a 60 year old woman, former smoker and current vaping with history of diabetes mellitus, anxiety/depression, and TIA who returns to pulmonary clinic for shortness of breath.   She does not have fixed obstructive defect on pulmonary function testing today and her test is overall within normal limits.  She does have mild air trapping on expiratory imaging on high-resolution CT chest scanning.  This is likely secondary to her ongoing vaping history.  I have urged her to quit vaping.  We will start her on Breo Ellipta 100-25 MCG 1 puff daily and monitor for any improvement in her shortness of breath.  Follow-up in 3 months.  Freda Jackson, MD [facility] Office: [phone]   Current Outpatient Medications:    Acetaminophen-Codeine 300-30 MG tablet, Take 1-2 tablets by mouth every 6 (six) hours as needed., Disp: , Rfl:    ARMOUR THYROID 30 MG tablet, Take 30 mg by mouth every morning., Disp: , Rfl:    cyclobenzaprine (FLEXERIL) 5 MG tablet, Take 5-10 mg by mouth at bedtime as needed., Disp: , Rfl:    diazepam (VALIUM)  5 MG tablet, Take by mouth., Disp: , Rfl:    fluticasone furoate-vilanterol (BREO ELLIPTA) 100-25 MCG/ACT AEPB, Inhale 1 puff into the lungs daily., Disp: 28 each, Rfl: 6

## 2021-09-16 NOTE — Patient Instructions (Signed)
Full PFT performed today. °

## 2021-09-16 NOTE — Patient Instructions (Signed)
There is concern for small airways disease based on your CT chest scan.  This is likely secondary to vaping.  Your pulmonary function tests are normal today.  Recommend that you work on quitting vaping.  We will start you on Breo Ellipta 1 puff daily for the small airways disease and your shortness of breath.  We will try this inhaler over the next 3 months until follow-up and monitor how your symptoms are doing.

## 2021-09-16 NOTE — Progress Notes (Signed)
Patient seen in the office today and instructed on use of Breo 100.  Patient expressed understanding and demonstrated technique.  [name] [facility] 09/16/2021

## 2021-10-24 ENCOUNTER — Other Ambulatory Visit: Payer: Self-pay | Admitting: Anesthesiology

## 2021-10-27 ENCOUNTER — Other Ambulatory Visit (HOSPITAL_COMMUNITY): Payer: Self-pay

## 2021-10-27 ENCOUNTER — Telehealth: Payer: Self-pay

## 2021-10-27 NOTE — Telephone Encounter (Signed)
Patient Advocate Encounter   Received notification from [facility] that prior authorization for [employer] Patter is required by his/her insurance [employer].   PA submitted on 10/27/21  Key#: [id number]  Status is pending    [facility] will continue to follow:  Patient Advocate Fax:  [phone]

## 2021-10-28 NOTE — Telephone Encounter (Signed)
Patient Advocate Encounter  Received notification from [employer] that the request for prior authorization for [name] has been denied because the patient does not have Asthma, Chronic Bronchitis, COPD, Emphysema or any postinfectious cough.   This encounter will continue to be updated until final determination.     [facility] Pharm[facility] Patient Advocate Fax:  [phone]

## 2021-10-29 ENCOUNTER — Ambulatory Visit
Admission: RE | Admit: 2021-10-29 | Discharge: 2021-10-29 | Disposition: A | Discharge status: Home / Self Care | Payer: BC Managed Care – PPO | Source: Ambulatory Visit | Attending: Orthopedic Surgery | Admitting: Orthopedic Surgery

## 2021-10-29 DIAGNOSIS — M5459 Other low back pain: Secondary | ICD-10-CM

## 2021-10-29 DIAGNOSIS — M533 Sacrococcygeal disorders, not elsewhere classified: Secondary | ICD-10-CM | POA: Diagnosis not present

## 2021-10-29 MED ORDER — DIAZEPAM 5 MG PO TABS
5.0000 mg | ORAL_TABLET | Freq: Once | ORAL | Status: AC
  Administered 2021-10-29: 12:00:00 5 mg via ORAL

## 2021-10-31 DIAGNOSIS — M533 Sacrococcygeal disorders, not elsewhere classified: Secondary | ICD-10-CM | POA: Diagnosis not present

## 2021-12-23 HISTORY — PX: OTHER SURGICAL HISTORY: SHX169

## 2022-05-04 ENCOUNTER — Other Ambulatory Visit: Payer: Self-pay | Admitting: Orthopedic Surgery

## 2022-05-04 DIAGNOSIS — M25551 Pain in right hip: Secondary | ICD-10-CM

## 2022-06-02 ENCOUNTER — Ambulatory Visit
Admission: RE | Admit: 2022-06-02 | Discharge: 2022-06-02 | Disposition: A | Discharge status: Home / Self Care | Payer: BC Managed Care – PPO | Source: Ambulatory Visit | Attending: Orthopedic Surgery | Admitting: Orthopedic Surgery

## 2022-06-02 DIAGNOSIS — M25551 Pain in right hip: Secondary | ICD-10-CM

## 2022-07-08 NOTE — Therapy (Signed)
OUTPATIENT PHYSICAL THERAPY THORACOLUMBAR EVALUATION   Patient Name: [name] MRN: [id number] DOB:1961-01-03, 61 y.o., female Today's Date: 07/09/2022   PT End of Session - 07/09/22 1500     Visit Number 1    Number of Visits 21    Date for PT Re-Evaluation 10/07/22    Authorization Type BC[employer]    PT Start Time 1447    PT Stop Time 1547    PT Time Calculation (min) 60 min    Activity Tolerance Patient tolerated treatment well;Patient limited by pain    Behavior During Therapy WFL for tasks assessed/performed             Past Medical History:  Diagnosis Date   BPPV (benign paroxysmal positional vertigo)    Vestib rehab ref-->07/15/18   Chronic headaches    Chronic low back pain    multilevel facet arthropathy   Chronic renal insufficiency, stage 2 (mild)    borderlined II/III (GF@59 )   Depression    Diabetes mellitus ([place]) 10/2019 prediab; 04/2020 DM 2   Fasting gluc low 120s, Hba1c 6.3%. June 2021 A1c 7%   History of adenomatous polyp of colon 03/09/2014   tubular adenoma 2011    Hyperlipidemia 10/2019   10 yr [place] CV risk= 3.5%->TLC.  LDL 170s + new dx DM 04/2020->pt declines any statin b/c of myalgis on 2 diff ones.   Migraine    NASH (nonalcoholic steatohepatitis) 03/21/2010   Qualifier: Diagnosis of  By: [name] MD, [name]    OSA (obstructive sleep apnea) 10/14/2012   Sleep study completed on 09/22/2012-interpreting physician Clinton D. Young MD  Impression: 1. 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 2. Moderate scarring with oxygen saturations were -87% with mean saturation to the study night at 92% on room air. 3. Regular cardiac rhythm and the heart rate is 60 beats per minute.    R[facility] spotted fever) 2017/18   Lyme neg   Sacroiliac pain    chronic, left (Dr. Othelia Pulling eval 04/2021->SI injection helpful.   Tobacco dependence    switched to vap 2017   Past Surgical History:  Procedure Laterality Date   ABDOMINAL  HYSTERECTOMY  2010   [name]   CAROTID DOPPLERS Bilateral 07/24/2020   NORMAL   cataract surg     2021   CHOLECYSTECTOMY N/A 03/10/2014   Procedure: LAPAROSCOPIC CHOLECYSTECTOMY WITH INTRAOPERATIVE CHOLANGIOGRAM;  Surgeon: [name], MD;  Location: [facility];  Service: General;  Laterality: N/A;   COLONOSCOPY W/ BIOPSIES  2011; 03/2014   Dr [name] 2011.  [name] 2015 (polyps-->recall 5 yrs).   ESOPHAGOGASTRODUODENOSCOPY  2011   Dr [name]   KNEE ARTHROSCOPY  2015   LAPAROSCOPIC APPENDECTOMY  2009   [name]   RHYTHM MONITORING  07/2020   normal   sij fusion Right 12/23/2021   TEMPOROMANDIBULAR JOINT ARTHROPLASTY     TEMPOROMANDIBULAR JOINT SURGERY     TONSILLECTOMY     TRANSTHORACIC ECHOCARDIOGRAM  08/2020   COMPLETELY NORMAL   Patient Active Problem List   Diagnosis Date Noted   Sun-damaged skin 08/04/2017   Peripheral tear of medial meniscus of left knee as current injury 01/04/2017   Common migraine with intractable migraine 05/11/2016   Non-intractable cyclical vomiting without nausea 08/28/2015   Carbuncle, thigh 12/19/2014   Personal history of colonic polyps 03/09/2014   Nausea and vomiting 03/09/2014   Abdominal distension - diffuse 03/09/2014   Obesity (BMI 30-39.9) 03/09/2014   Tobacco  abuse 03/09/2014   Skin lesion of cheek 09/08/2013   Low back pain 08/21/2013   OSA (obstructive sleep apnea) 10/14/2012   Hyperlipidemia 09/07/2012   Chronic fatigue 08/16/2012   Nonspecific elevation of levels of transaminase or lactic acid dehydrogenase (LDH) 08/16/2012   Tobacco use 08/16/2012   NASH (nonalcoholic steatohepatitis) 03/21/2010   IBS (irritable bowel syndrome) 11/26/2009    PCP: Percell Belt, DO  REFERRING PROVIDER: Phylliss Bob, MD  REFERRING DIAG: Low back pain  Rationale for Evaluation and Treatment Rehabilitation  THERAPY DIAG:  Other low back pain  Muscle weakness (generalized)  Difficulty walking  ONSET DATE:  2018  SUBJECTIVE:                                                                                                                                                                                           SUBJECTIVE STATEMENT:  Pt states the pain all started after an SIJ fusion in Feb 2023. She states she has had the hip and back pain for 6 years now and Dr. Lynann Bologna performed her R SIJ fusion. Pt has tried TPDN and chiro visits. Pt states that she is 70% better than she was after surgery but still is not very good. She has not been able to tolerate land therapy very well. The later in the day, the worse the pain has been. Pt states she is only able to work from bed. She is unable to do any ADL at home due to the pain and weakness. Pt does not have NT down the legs but only into the groin.   She has been doing PT at [facility] since surgery but she has not gotten much better due to her poor gait quality and muscle weakness that does not respond well to . She has sciatica type symptoms as well. Pt will sometimes use an AD for anything over half an hour long.   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 Pt is here primarily for aquatic therapy.   Pt was doing PT on land: straight leg bridge, SLR, LTR while at home. Pt tried some TPDN and deep tissue massaging but no stretching at all.    PERTINENT HISTORY:  SIJ  fusion Dec 23 2021, TIA   PAIN:  Are you having pain? Yes: NPRS scale: 4/10 (today is a good day) Pain location: anterior hip crease, R SIJ, deep inside R hip Pain description: sharp, pins and needles, numbness, tearing feeling Aggravating factors: stairs, sitting too long, standing too long, walking, chores, cooking, showering Relieving factors: heat, meds, ice, leaning on a cart  PRECAUTIONS: None  WEIGHT BEARING RESTRICTIONS No  FALLS:  Has patient fallen in last 6 months? No, but pain was so bad caused her knees to buckle  LIVING  ENVIRONMENT: Lives with: lives with their family and lives with their spouse Lives in: House/apartment Stairs: yes Has following equipment at home: Single point cane and Environmental consultant - 4 wheeled  OCCUPATION: [employer]  PLOF: Independent with basic ADLs  PATIENT GOALS : be able to exercise again; pt is acutely aware of recent weight gain and loss of function   OBJECTIVE:   DIAGNOSTIC FINDINGS:  R hip MRI  IMPRESSION: 1. Postsurgical changes related to interval arthrodesis of the right sacroiliac joint. 2. No acute findings or explanation for the patient's symptoms. No significant hip arthropathy or evidence of labral tear.    Disc levels:   T12-L1: No significant disc protrusion, foraminal stenosis, or canal stenosis.   L1-L2: No significant disc protrusion, foraminal stenosis, or canal stenosis.   L2-L3: Slight retrolisthesis of L2 on L3. Small left foraminal disc protrusion without significant canal or foraminal stenosis. No significant change.   L3-L4: Mild disc height loss and desiccation. Mild disc bulging with mild left facet hypertrophy and ligamentum flavum thickening. No significant canal or foraminal stenosis. No significant change.   L4-L5: Mild disc bulging and mild bilateral facet hypertrophy and ligamentum flavum thickening. No significant canal or foraminal stenosis. No significant change.   L5-S1: Bilateral facet arthropathy without significant canal or foraminal stenosis.   IMPRESSION: Similar multilevel degenerative change (detailed above) without significant canal or foraminal stenosis.  PATIENT SURVEYS:  FOTO 37 46 @ DC   SCREENING FOR RED FLAGS: Bowel or bladder incontinence: No Spinal tumors: No Cauda equina syndrome: No Compression fracture: No   COGNITION:  Overall cognitive status: Within functional limits for tasks assessed     SENSATION: Light touch: Impaired   MUSCLE LENGTH: Decreased R hip flexor length, decreased hip extension  and toe off on R  POSTURE: decreased lumbar lordosis, right pelvic obliquity, and weight shift left  PALPATION: TTP of R posterior and anterior hip  LUMBAR ROM: unable to fully complete following increase in pain with functional testing and gait observation  Active  A/PROM  eval  Flexion 50%  Extension 50%  Right lateral flexion   Left lateral flexion   Right rotation   Left rotation    (Blank rows = not tested)  LOWER EXTREMITY ROM:     Active  Right eval Left eval  Hip flexion 100 110  Hip extension 0 10  Hip abduction WFL with cross and uncross in supine [facility]  Hip adduction    Hip internal rotation    Hip external rotation 35 p! 40   (Blank rows = not tested)  LOWER EXTREMITY MMT:    MMT Right eval Left eval  Hip flexion 3+/5 p! 4/5  Hip extension 3+/5 p! 4/5  Hip abduction 4/5 p! 4/5  Hip adduction 4/5 p! 4/5   (Blank rows = not tested)  LUMBAR SPECIAL TESTS:  Slump test: Negative, Single leg stance test: Positive, SI Compression/distraction test: unable to test secondary to pain FABER test: Positive, and Trendelenburg sign: Positive  FUNCTIONAL TESTS:  5 times sit to stand: 24.1s  UE assist needed  GAIT: Distance walked: 271f Assistive device utilized: Single point cane(provided by clinician in order to walk to pool deck for edu Level of assistance: Modified independence Comments: Without AD: decreased stance time on R, lack of toe off and hip ext,  antalgic gait  With AD: L trunk lean onto AD, decreased stance time on R, mild improvement in hip ext    TODAY'S TREATMENT  Review of previous HEP with other PT and this is in addition to LTR and SLR; bridge with band to replace straight leg bridging  Exercises - Supine Posterior Pelvic Tilt  - 2 x daily - 7 x weekly - 2 sets - 10 reps - 2 hold - Neutral Lumbar Spine Curl Up  - 2 x daily - 7 x weekly - 2 sets - 10 reps - 2 hold - Standing Hip Flexor Stretch  - 2 x daily - 7 x weekly - 1 sets - 2 reps -  30 hold - Bridge with Hip Abduction and Resistance  - 2 x daily - 7 x weekly - 2 sets - 10 reps   PATIENT EDUCATION:  Education details: MOI, diagnosis, prognosis, anatomy, exercise progression, DOMS expectations, muscle firing,  envelope of function, HEP, POC  Person educated: Patient Education method: Explanation, Demonstration, Tactile cues, Verbal cues, and Handouts Education comprehension: verbalized understanding, returned demonstration, verbal cues required, and tactile cues required   HOME EXERCISE PROGRAM: Access Code: [email or web address] URL: [email or web address] Date: 07/09/2022 Prepared by: Daleen Bo  ASSESSMENT:  CLINICAL IMPRESSION: Patient is a 61 y.o. female who was seen today for physical therapy evaluation and treatment for c/c of LBP and R hip pain. Pt's pain is highly sensitive and irritable with movement. Pt able to walk independently without AD but has pain with each step and apprehensive with WB position. Pt is largely stiffness dominant at the hip at this time and strength limited with lumbopelvic motor control. Pt with most difficulty at this time with functional mobility and ability to perform self care, ADL, and household duties. Pt would benefit from continued skilled therapy in order to reach goals and maximize functional lumbopelvic strength and ROM for prevention of further functional decline.   OBJECTIVE IMPAIRMENTS Abnormal gait, decreased activity tolerance, decreased balance, decreased endurance, decreased mobility, difficulty walking, decreased ROM, decreased strength, hypomobility, increased fascial restrictions, increased muscle spasms, impaired flexibility, improper body mechanics, postural dysfunction, and pain.   ACTIVITY LIMITATIONS carrying, lifting, bending, sitting, standing, squatting, stairs, transfers, and locomotion level  PARTICIPATION LIMITATIONS: cleaning, laundry, interpersonal relationship, driving, shopping, community activity,  occupation, and exercise  PERSONAL FACTORS Age, Behavior pattern, Fitness, Past/current experiences, Time since onset of injury/illness/exacerbation, and 1-2 comorbidities:  are also affecting patient's functional outcome.   REHAB POTENTIAL: Fair   CLINICAL DECISION MAKING: Stable/uncomplicated  EVALUATION COMPLEXITY: Low   GOALS:   SHORT TERM GOALS: Target date: 08/20/2022   Pt will become independent with HEP in order to demonstrate synthesis of PT education.   Goal status: INITIAL  2.  Pt will report at least 2 pt reduction on NPRS scale for pain in order to demonstrate functional improvement with household activity, self care, and ADL.   Goal status: INITIAL  3.  Pt will be able to demonstrate ability to ascend and descend stairs with step to pattern without pain in order to demonstrate functional improvement in lumbar function for self-care and house hold duties.   Goal status: INITIAL   LONG TERM GOALS: Target date: 10/01/2022   Pt  will become independent with final HEP in order to demonstrate synthesis of PT education.   Goal status: INITIAL  2.  Pt will score >/= 46 on FOTO to demonstrate improvement in perceived lumbar function.   Goal status: INITIAL  3.  Pt  will be able to demonstrate/report ability to walk >15 mins without pain in order to demonstrate functional improvement and tolerance to exercise and community mobility.   Goal status: INITIAL  4.  Pt will be able to demonstrate/report ability to sit/stand/sleep for extended periods of time without pain in order to demonstrate functional improvement and tolerance to static positioning.  Goal status: INITIAL  5. Pt will be able to perform 5XSTS in under 12s with or without UE  in order to demonstrate functional improvement above the cut off score for adults.   Goal status: INITIAL     PLAN: PT FREQUENCY: 1-2x/week  PT DURATION: 12 weeks  PLANNED INTERVENTIONS: Therapeutic exercises, Therapeutic  activity, Neuromuscular re-education, Balance training, Gait training, Patient/Family education, Self Care, Joint mobilization, Joint manipulation, Stair training, Orthotic/Fit training, DME instructions, Aquatic Therapy, Dry Needling, Electrical stimulation, Spinal manipulation, Spinal mobilization, Cryotherapy, Moist heat, scar mobilization, Splintting, Taping, Vasopneumatic device, Traction, Ultrasound, Ionotophoresis 70m/ml Dexamethasone, Manual therapy, and Re-evaluation  PLAN FOR NEXT SESSION: intro to aquatic; R anterior hip/thigh stretching, normalizing gait pattern, lumbopelvic strength and motor control  ADaleen BoPT, DPT 07/09/22 4:13 PM

## 2022-07-09 ENCOUNTER — Encounter (HOSPITAL_BASED_OUTPATIENT_CLINIC_OR_DEPARTMENT_OTHER): Payer: Self-pay | Admitting: Physical Therapy

## 2022-07-09 ENCOUNTER — Other Ambulatory Visit: Payer: Self-pay

## 2022-07-09 ENCOUNTER — Ambulatory Visit (HOSPITAL_BASED_OUTPATIENT_CLINIC_OR_DEPARTMENT_OTHER): Payer: BC Managed Care – PPO | Attending: Orthopedic Surgery | Admitting: Physical Therapy

## 2022-07-09 DIAGNOSIS — M5459 Other low back pain: Secondary | ICD-10-CM | POA: Insufficient documentation

## 2022-07-09 DIAGNOSIS — R262 Difficulty in walking, not elsewhere classified: Secondary | ICD-10-CM | POA: Diagnosis present

## 2022-07-09 DIAGNOSIS — M6281 Muscle weakness (generalized): Secondary | ICD-10-CM | POA: Diagnosis present

## 2022-07-22 ENCOUNTER — Ambulatory Visit (HOSPITAL_BASED_OUTPATIENT_CLINIC_OR_DEPARTMENT_OTHER): Payer: BC Managed Care – PPO | Attending: Orthopedic Surgery | Admitting: Physical Therapy

## 2022-07-22 ENCOUNTER — Encounter (HOSPITAL_BASED_OUTPATIENT_CLINIC_OR_DEPARTMENT_OTHER): Payer: Self-pay | Admitting: Physical Therapy

## 2022-07-22 DIAGNOSIS — M6281 Muscle weakness (generalized): Secondary | ICD-10-CM | POA: Insufficient documentation

## 2022-07-22 DIAGNOSIS — R262 Difficulty in walking, not elsewhere classified: Secondary | ICD-10-CM | POA: Insufficient documentation

## 2022-07-22 DIAGNOSIS — M5459 Other low back pain: Secondary | ICD-10-CM | POA: Insufficient documentation

## 2022-07-22 NOTE — Therapy (Signed)
OUTPATIENT PHYSICAL THERAPY THORACOLUMBAR EVALUATION   Patient Name: [name] MRN: [id number] DOB:[birth year], 61 y.o., female Today's Date: 07/22/2022   PT End of Session - 07/22/22 1516     Visit Number 2    Number of Visits 21    Date for PT Re-Evaluation 10/07/22    Authorization Type BCBS    PT Start Time 1430    PT Stop Time 1510    PT Time Calculation (min) 40 min    Activity Tolerance Patient tolerated treatment well;Patient limited by pain    Behavior During Therapy WFL for tasks assessed/performed              Past Medical History:  Diagnosis Date   BPPV (benign paroxysmal positional vertigo)    Vestib rehab ref-->07/15/18   Chronic headaches    Chronic low back pain    multilevel facet arthropathy   Chronic renal insufficiency, stage 2 (mild)    borderlined II/III (GF@60 )   Depression    Diabetes mellitus ([place]) 10/2019 prediab; 04/2020 DM 2   Fasting gluc low 120s, Hba1c 6.3%. June 2021 A1c 7%   History of adenomatous polyp of colon 03/09/2014   tubular adenoma 2011    Hyperlipidemia 10/2019   10 yr [place] CV risk= 3.5%->[facility].  LDL 170s + new dx DM 04/2020->pt declines any statin b/c of myalgis on 2 diff ones.   Migraine    NASH (nonalcoholic steatohepatitis) 03/21/2010   Qualifier: Diagnosis of  By: [name] MD, [name]    OSA (obstructive sleep apnea) 10/14/2012   Sleep study completed on 09/22/2012-interpreting physician Clinton D. Young MD  Impression: 1. 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 2. Moderate scarring with oxygen saturations were -87% with mean saturation to the study night at 92% on room air. 3. Regular cardiac rhythm and the heart rate is 60 beats per minute.    [facility] spotted fever) 2017/18   Lyme neg   Sacroiliac pain    chronic, left (Dr. Othelia Pulling eval 04/2021->SI injection helpful.   Tobacco dependence    switched to vap 2017   Past Surgical History:  Procedure Laterality Date   ABDOMINAL  HYSTERECTOMY  2010   [name]   CAROTID DOPPLERS Bilateral 07/24/2020   NORMAL   cataract surg     2021   CHOLECYSTECTOMY N/A 03/10/2014   Procedure: LAPAROSCOPIC CHOLECYSTECTOMY WITH INTRAOPERATIVE CHOLANGIOGRAM;  Surgeon: [name], MD;  Location: [facility];  Service: General;  Laterality: N/A;   COLONOSCOPY W/ BIOPSIES  2011; 03/2014   Dr [name] 2011.  [name] 2015 (polyps-->recall 5 yrs).   ESOPHAGOGASTRODUODENOSCOPY  2011   Dr [name]   KNEE ARTHROSCOPY  2015   LAPAROSCOPIC APPENDECTOMY  2009   [name]   RHYTHM MONITORING  07/2020   normal   sij fusion Right 12/23/2021   TEMPOROMANDIBULAR JOINT ARTHROPLASTY     TEMPOROMANDIBULAR JOINT SURGERY     TONSILLECTOMY     TRANSTHORACIC ECHOCARDIOGRAM  08/2020   COMPLETELY NORMAL   Patient Active Problem List   Diagnosis Date Noted   Sun-damaged skin 08/04/2017   Peripheral tear of medial meniscus of left knee as current injury 01/04/2017   Common migraine with intractable migraine 05/11/2016   Non-intractable cyclical vomiting without nausea 08/28/2015   Carbuncle, thigh 12/19/2014   Personal history of colonic polyps 03/09/2014   Nausea and vomiting 03/09/2014   Abdominal distension - diffuse 03/09/2014   Obesity (BMI 30-39.9) 03/09/2014  Tobacco abuse 03/09/2014   Skin lesion of cheek 09/08/2013   Low back pain 08/21/2013   OSA (obstructive sleep apnea) 10/14/2012   Hyperlipidemia 09/07/2012   Chronic fatigue 08/16/2012   Nonspecific elevation of levels of transaminase or lactic acid dehydrogenase (LDH) 08/16/2012   Tobacco use 08/16/2012   NASH (nonalcoholic steatohepatitis) 03/21/2010   IBS (irritable bowel syndrome) 11/26/2009    PCP: Percell Belt, DO  REFERRING PROVIDER: Phylliss Bob, MD  REFERRING DIAG: Low back pain  Rationale for Evaluation and Treatment Rehabilitation  THERAPY DIAG:  Other low back pain  Muscle weakness (generalized)  Difficulty walking  ONSET DATE:  2018  SUBJECTIVE:                                                                                                                                                                                           SUBJECTIVE STATEMENT:  Pt states she had some injections done and it feels a little bit better but she is still in pain. She states the ant hip stretch seemed to make a difference.     Eval:  Pt states the pain all started after an SIJ fusion in Feb 2023. She states she has had the hip and back pain for 6 years now and Dr. Lynann Bologna performed her R SIJ fusion. Pt has tried TPDN and chiro visits. Pt states that she is 70% better than she was after surgery but still is not very good. She has not been able to tolerate land therapy very well. The later in the day, the worse the pain has been. Pt states she is only able to work from bed. She is unable to do any ADL at home due to the pain and weakness. Pt does not have NT down the legs but only into the groin.   She has been doing PT at [facility] since surgery but she has not gotten much better due to her poor gait quality and muscle weakness that does not respond well to . She has sciatica type symptoms as well. Pt will sometimes use an AD for anything over half an hour long.   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 Pt is here primarily for aquatic therapy.   Pt was doing PT on land: straight leg bridge, SLR, LTR while at home. Pt tried some TPDN and deep tissue massaging but no stretching at all.    PERTINENT HISTORY:  SIJ  fusion Dec 23 2021, TIA   PAIN:  Are you having pain? Yes: NPRS scale: 3.5/10 (today is a good day) Pain location: anterior hip crease,  R SIJ, deep inside R hip Pain description: sharp, pins and needles, numbness, tearing feeling Aggravating factors: stairs, sitting too long, standing too long, walking, chores, cooking, showering Relieving factors: heat, meds, ice, leaning on a  cart  PRECAUTIONS: None  WEIGHT BEARING RESTRICTIONS No  FALLS:  Has patient fallen in last 6 months? No, but pain was so bad caused her knees to buckle  LIVING ENVIRONMENT: Lives with: lives with their family and lives with their spouse Lives in: House/apartment Stairs: yes Has following equipment at home: Single point cane and Environmental consultant - 4 wheeled  OCCUPATION: [employer]  PLOF: Independent with basic ADLs  PATIENT GOALS : be able to exercise again; pt is acutely aware of recent weight gain and loss of function   OBJECTIVE:   DIAGNOSTIC FINDINGS:  R hip MRI  IMPRESSION: 1. Postsurgical changes related to interval arthrodesis of the right sacroiliac joint. 2. No acute findings or explanation for the patient's symptoms. No significant hip arthropathy or evidence of labral tear.    Disc levels:   T12-L1: No significant disc protrusion, foraminal stenosis, or canal stenosis.   L1-L2: No significant disc protrusion, foraminal stenosis, or canal stenosis.   L2-L3: Slight retrolisthesis of L2 on L3. Small left foraminal disc protrusion without significant canal or foraminal stenosis. No significant change.   L3-L4: Mild disc height loss and desiccation. Mild disc bulging with mild left facet hypertrophy and ligamentum flavum thickening. No significant canal or foraminal stenosis. No significant change.   L4-L5: Mild disc bulging and mild bilateral facet hypertrophy and ligamentum flavum thickening. No significant canal or foraminal stenosis. No significant change.   L5-S1: Bilateral facet arthropathy without significant canal or foraminal stenosis.   IMPRESSION: Similar multilevel degenerative change (detailed above) without significant canal or foraminal stenosis.  PATIENT SURVEYS:  FOTO 37 46 @ DC     TODAY'S TREATMENT  9/13  Pt seen for aquatic therapy today.  Treatment took place in water 3.25-4 ft in depth at the [facility] pool. Temp of water  was 92.  Pt entered/exited the pool via stairs (step to) independently with bilat rail.   Warm up: 3x sidestepping, retro walking, fwd walking   Exercises:  standing hip flexor stretch 30s 3x  standing squats 2x10 without UE support  standing lumbar flexion stretch 10x fwd, 5x L and R (felt very good) Standing march 2x10 STS off bench 2x10 Straddle sit on noodle 30s 3x Straddle sit cycling 20x fwd and retro    Pt requires buoyancy for support and to offload joints with strengthening exercises. Viscosity of the water is needed for resistance of strengthening; water current perturbations provides challenge to standing balance unsupported, requiring increased core activation.      Eval: Review of previous HEP with other PT and this is in addition to LTR and SLR; bridge with band to replace straight leg bridging  Exercises - Supine Posterior Pelvic Tilt  - 2 x daily - 7 x weekly - 2 sets - 10 reps - 2 hold - Neutral Lumbar Spine Curl Up  - 2 x daily - 7 x weekly - 2 sets - 10 reps - 2 hold - Standing Hip Flexor Stretch  - 2 x daily - 7 x weekly - 1 sets - 2 reps - 30 hold - Bridge with Hip Abduction and Resistance  - 2 x daily - 7 x weekly - 2 sets - 10 reps   PATIENT EDUCATION:  Education details: MOI, diagnosis, prognosis, anatomy, exercise progression,  DOMS expectations, muscle firing,  envelope of function, HEP, POC  Person educated: Patient Education method: Explanation, Demonstration, Tactile cues, Verbal cues, and Handouts Education comprehension: verbalized understanding, returned demonstration, verbal cues required, and tactile cues required   HOME EXERCISE PROGRAM: Access Code: [email or web address] URL: [email or web address] Date: 07/09/2022 Prepared by: Daleen Bo  ASSESSMENT:  CLINICAL IMPRESSION: Pt able to tolerate gentle intro to aquatic exercise at today's session but with report of mildly increased soreness at end of session. Pt's gait quality is  signficantly improved in the water. Pt required frequent rest breaks and moments of floatation in order to relax R posterior hip and lumbar between sets. Pt appears to have most irritation into anterior hip and R QL with standing exercise in pool. Plan to continue with improving gait, building hip and core strength, as well as improving functional capacity as pt is limited with muscle endurance. Pt would benefit from continued skilled therapy in order to reach goals and maximize functional lumbopelvic strength and ROM for prevention of further functional decline.   OBJECTIVE IMPAIRMENTS Abnormal gait, decreased activity tolerance, decreased balance, decreased endurance, decreased mobility, difficulty walking, decreased ROM, decreased strength, hypomobility, increased fascial restrictions, increased muscle spasms, impaired flexibility, improper body mechanics, postural dysfunction, and pain.   ACTIVITY LIMITATIONS carrying, lifting, bending, sitting, standing, squatting, stairs, transfers, and locomotion level  PARTICIPATION LIMITATIONS: cleaning, laundry, interpersonal relationship, driving, shopping, community activity, occupation, and exercise  PERSONAL FACTORS Age, Behavior pattern, Fitness, Past/current experiences, Time since onset of injury/illness/exacerbation, and 1-2 comorbidities:  are also affecting patient's functional outcome.   REHAB POTENTIAL: Fair   CLINICAL DECISION MAKING: Stable/uncomplicated  EVALUATION COMPLEXITY: Low   GOALS:   SHORT TERM GOALS: Target date: 08/20/2022   Pt will become independent with HEP in order to demonstrate synthesis of PT education.   Goal status: INITIAL  2.  Pt will report at least 2 pt reduction on NPRS scale for pain in order to demonstrate functional improvement with household activity, self care, and ADL.   Goal status: INITIAL  3.  Pt will be able to demonstrate ability to ascend and descend stairs with step to pattern without pain  in order to demonstrate functional improvement in lumbar function for self-care and house hold duties.   Goal status: INITIAL   LONG TERM GOALS: Target date: 10/01/2022   Pt  will become independent with final HEP in order to demonstrate synthesis of PT education.   Goal status: INITIAL  2.  Pt will score >/= 46 on FOTO to demonstrate improvement in perceived lumbar function.   Goal status: INITIAL  3.  Pt will be able to demonstrate/report ability to walk >15 mins without pain in order to demonstrate functional improvement and tolerance to exercise and community mobility.   Goal status: INITIAL  4.  Pt will be able to demonstrate/report ability to sit/stand/sleep for extended periods of time without pain in order to demonstrate functional improvement and tolerance to static positioning.  Goal status: INITIAL  5. Pt will be able to perform 5XSTS in under 12s with or without UE  in order to demonstrate functional improvement above the cut off score for adults.   Goal status: INITIAL     PLAN: PT FREQUENCY: 1-2x/week  PT DURATION: 12 weeks  PLANNED INTERVENTIONS: Therapeutic exercises, Therapeutic activity, Neuromuscular re-education, Balance training, Gait training, Patient/Family education, Self Care, Joint mobilization, Joint manipulation, Stair training, Orthotic/Fit training, DME instructions, Aquatic Therapy, Dry Needling, Electrical stimulation, Spinal manipulation, Spinal mobilization, Cryotherapy, Moist heat, scar  mobilization, Splintting, Taping, Vasopneumatic device, Traction, Ultrasound, Ionotophoresis 45m/ml Dexamethasone, Manual therapy, and Re-evaluation  PLAN FOR NEXT SESSION: repeat aquatic; R anterior hip/thigh stretching, normalizing gait pattern, lumbopelvic strength and motor control  ADaleen BoPT, DPT 07/22/22 3:17 PM

## 2022-07-29 ENCOUNTER — Encounter (HOSPITAL_BASED_OUTPATIENT_CLINIC_OR_DEPARTMENT_OTHER): Payer: Self-pay | Admitting: Physical Therapy

## 2022-07-29 ENCOUNTER — Ambulatory Visit (HOSPITAL_BASED_OUTPATIENT_CLINIC_OR_DEPARTMENT_OTHER): Payer: BC Managed Care – PPO | Admitting: Physical Therapy

## 2022-07-29 DIAGNOSIS — M5459 Other low back pain: Secondary | ICD-10-CM | POA: Diagnosis not present

## 2022-07-29 DIAGNOSIS — R262 Difficulty in walking, not elsewhere classified: Secondary | ICD-10-CM

## 2022-07-29 DIAGNOSIS — M6281 Muscle weakness (generalized): Secondary | ICD-10-CM

## 2022-07-29 NOTE — Therapy (Signed)
OUTPATIENT PHYSICAL THERAPY THORACOLUMBAR    Patient Name: [name] MRN: [id number] DOB:September 07, 1961, 61 y.o., female Today's Date: 07/29/2022   PT End of Session - 07/29/22 1525     Visit Number 3    Number of Visits 21    Date for PT Re-Evaluation 10/07/22    Authorization Type BCBS    PT Start Time 1430    PT Stop Time 1510    PT Time Calculation (min) 40 min    Activity Tolerance Patient tolerated treatment well;Patient limited by pain    Behavior During Therapy WFL for tasks assessed/performed               Past Medical History:  Diagnosis Date   BPPV (benign paroxysmal positional vertigo)    Vestib rehab ref-->07/15/18   Chronic headaches    Chronic low back pain    multilevel facet arthropathy   Chronic renal insufficiency, stage 2 (mild)    borderlined II/III (GF@59 )   Depression    Diabetes mellitus ([place]) 10/2019 prediab; 04/2020 DM 2   Fasting gluc low 120s, Hba1c 6.3%. June 2021 A1c 7%   History of adenomatous polyp of colon 03/09/2014   tubular adenoma 2011    Hyperlipidemia 10/2019   10 yr [place] CV risk= 3.5%->TLC.  LDL 170s + new dx DM 04/2020->pt declines any statin b/c of myalgis on 2 diff ones.   Migraine    NASH (nonalcoholic steatohepatitis) 03/21/2010   Qualifier: Diagnosis of  By: [name] MD, [name]    OSA (obstructive sleep apnea) 10/14/2012   Sleep study completed on 09/22/2012-interpreting physician Clinton D. Young MD  Impression: 1. 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 2. Moderate scarring with oxygen saturations were -87% with mean saturation to the study night at 92% on room air. 3. Regular cardiac rhythm and the heart rate is 60 beats per minute.    [facility] spotted fever) 2017/18   Lyme neg   Sacroiliac pain    chronic, left (Dr. Othelia Pulling eval 04/2021->SI injection helpful.   Tobacco dependence    switched to vap 2017   Past Surgical History:  Procedure Laterality Date   ABDOMINAL  HYSTERECTOMY  2010   [name]   CAROTID DOPPLERS Bilateral 07/24/2020   NORMAL   cataract surg     2021   CHOLECYSTECTOMY N/A 03/10/2014   Procedure: LAPAROSCOPIC CHOLECYSTECTOMY WITH INTRAOPERATIVE CHOLANGIOGRAM;  Surgeon: [name], MD;  Location: [facility];  Service: General;  Laterality: N/A;   COLONOSCOPY W/ BIOPSIES  2011; 03/2014   Dr [name] 2011.  [name] 2015 (polyps-->recall 5 yrs).   ESOPHAGOGASTRODUODENOSCOPY  2011   Dr [name]   KNEE ARTHROSCOPY  2015   LAPAROSCOPIC APPENDECTOMY  2009   [name]   RHYTHM MONITORING  07/2020   normal   sij fusion Right 12/23/2021   TEMPOROMANDIBULAR JOINT ARTHROPLASTY     TEMPOROMANDIBULAR JOINT SURGERY     TONSILLECTOMY     TRANSTHORACIC ECHOCARDIOGRAM  08/2020   COMPLETELY NORMAL   Patient Active Problem List   Diagnosis Date Noted   Sun-damaged skin 08/04/2017   Peripheral tear of medial meniscus of left knee as current injury 01/04/2017   Common migraine with intractable migraine 05/11/2016   Non-intractable cyclical vomiting without nausea 08/28/2015   Carbuncle, thigh 12/19/2014   Personal history of colonic polyps 03/09/2014   Nausea and vomiting 03/09/2014   Abdominal distension - diffuse 03/09/2014   Obesity (BMI 30-39.9) 03/09/2014  Tobacco abuse 03/09/2014   Skin lesion of cheek 09/08/2013   Low back pain 08/21/2013   OSA (obstructive sleep apnea) 10/14/2012   Hyperlipidemia 09/07/2012   Chronic fatigue 08/16/2012   Nonspecific elevation of levels of transaminase or lactic acid dehydrogenase (LDH) 08/16/2012   Tobacco use 08/16/2012   NASH (nonalcoholic steatohepatitis) 03/21/2010   IBS (irritable bowel syndrome) 11/26/2009    PCP: Percell Belt, DO  REFERRING PROVIDER: Phylliss Bob, MD  REFERRING DIAG: Low back pain  Rationale for Evaluation and Treatment Rehabilitation  THERAPY DIAG:  Other low back pain  Muscle weakness (generalized)  Difficulty walking  ONSET DATE:  2018  SUBJECTIVE:                                                                                                                                                                                           SUBJECTIVE STATEMENT:  Pt states that she was only sore for 1 night after last session.  Pt states the pain is better since starting aquatic therapy.     Eval:  Pt states the pain all started after an SIJ fusion in Feb 2023. She states she has had the hip and back pain for 6 years now and Dr. Lynann Bologna performed her R SIJ fusion. Pt has tried TPDN and chiro visits. Pt states that she is 70% better than she was after surgery but still is not very good. She has not been able to tolerate land therapy very well. The later in the day, the worse the pain has been. Pt states she is only able to work from bed. She is unable to do any ADL at home due to the pain and weakness. Pt does not have NT down the legs but only into the groin.   She has been doing PT at [place] since surgery but she has not gotten much better due to her poor gait quality and muscle weakness that does not respond well to . She has sciatica type symptoms as well. Pt will sometimes use an AD for anything over half an hour long.   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 Pt is here primarily for aquatic therapy.   Pt was doing PT on land: straight leg bridge, SLR, LTR while at home. Pt tried some TPDN and deep tissue massaging but no stretching at all.    PERTINENT HISTORY:  SIJ  fusion Dec 23 2021, TIA   PAIN:  Are you having pain? Yes: NPRS scale: 3.5/10 (today is a good day) Pain location: anterior hip crease, R SIJ, deep inside R hip Pain  description: sharp, pins and needles, numbness, tearing feeling Aggravating factors: stairs, sitting too long, standing too long, walking, chores, cooking, showering Relieving factors: heat, meds, ice, leaning on a cart  PRECAUTIONS: None  WEIGHT  BEARING RESTRICTIONS No  FALLS:  Has patient fallen in last 6 months? No, but pain was so bad caused her knees to buckle  LIVING ENVIRONMENT: Lives with: lives with their family and lives with their spouse Lives in: House/apartment Stairs: yes Has following equipment at home: Single point cane and Environmental consultant - 4 wheeled  OCCUPATION: [employer]  PLOF: Independent with basic ADLs  PATIENT GOALS : be able to exercise again; pt is acutely aware of recent weight gain and loss of function   OBJECTIVE:   DIAGNOSTIC FINDINGS:  R hip MRI  IMPRESSION: 1. Postsurgical changes related to interval arthrodesis of the right sacroiliac joint. 2. No acute findings or explanation for the patient's symptoms. No significant hip arthropathy or evidence of labral tear.    Disc levels:   T12-L1: No significant disc protrusion, foraminal stenosis, or canal stenosis.   L1-L2: No significant disc protrusion, foraminal stenosis, or canal stenosis.   L2-L3: Slight retrolisthesis of L2 on L3. Small left foraminal disc protrusion without significant canal or foraminal stenosis. No significant change.   L3-L4: Mild disc height loss and desiccation. Mild disc bulging with mild left facet hypertrophy and ligamentum flavum thickening. No significant canal or foraminal stenosis. No significant change.   L4-L5: Mild disc bulging and mild bilateral facet hypertrophy and ligamentum flavum thickening. No significant canal or foraminal stenosis. No significant change.   L5-S1: Bilateral facet arthropathy without significant canal or foraminal stenosis.   IMPRESSION: Similar multilevel degenerative change (detailed above) without significant canal or foraminal stenosis.  PATIENT SURVEYS:  FOTO 37 46 @ DC     TODAY'S TREATMENT  9/20   Pt seen for aquatic therapy today.  Treatment took place in water 3.25-4 ft in depth at the [facility] pool. Temp of water was 90.  Pt entered/exited the pool  via stairs (step to) independently with bilat rail.   Warm up: 3x sidestepping, retro walking, fwd walking   Exercises:  -standing hip flexor stretch 30s 3x - standing squats 2x10 without UE support  -standing lumbar flexion stretch 10x fwd, 5x L and R  -Standing trunk rotation with noodle 20x -Kickboard press and row with TrA brace 2x20  -Lateral lunge stretch 10x 5s hold  -Straddle sit cycling 20x fwd and retro; scissor kicking   -Attempted fwd lunge but flared up pain  Walking between sets for recovery ; 1x seated rest break needed due to lumbar muscle spasm  Pt requires buoyancy for support and to offload joints with strengthening exercises. Viscosity of the water is needed for resistance of strengthening; water current perturbations provides challenge to standing balance unsupported, requiring increased core activation.     9/13  Pt seen for aquatic therapy today.  Treatment took place in water 3.25-4 ft in depth at the [facility] pool. Temp of water was 92.  Pt entered/exited the pool via stairs (step to) independently with bilat rail.   Warm up: 3x sidestepping, retro walking, fwd walking   Exercises:  standing hip flexor stretch 30s 3x  standing squats 2x10 without UE support  standing lumbar flexion stretch 10x fwd, 5x L and R (felt very good) Standing march 2x10 STS off bench 2x10 Straddle sit on noodle 30s 3x Straddle sit cycling 20x fwd and retro    Pt  requires buoyancy for support and to offload joints with strengthening exercises. Viscosity of the water is needed for resistance of strengthening; water current perturbations provides challenge to standing balance unsupported, requiring increased core activation.      Eval: Review of previous HEP with other PT and this is in addition to LTR and SLR; bridge with band to replace straight leg bridging  Exercises - Supine Posterior Pelvic Tilt  - 2 x daily - 7 x weekly - 2 sets - 10 reps - 2 hold -  Neutral Lumbar Spine Curl Up  - 2 x daily - 7 x weekly - 2 sets - 10 reps - 2 hold - Standing Hip Flexor Stretch  - 2 x daily - 7 x weekly - 1 sets - 2 reps - 30 hold - Bridge with Hip Abduction and Resistance  - 2 x daily - 7 x weekly - 2 sets - 10 reps   PATIENT EDUCATION:  Education details: MOI, diagnosis, prognosis, anatomy, exercise progression, DOMS expectations, muscle firing,  envelope of function, HEP, POC  Person educated: Patient Education method: Explanation, Demonstration, Tactile cues, Verbal cues, and Handouts Education comprehension: verbalized understanding, returned demonstration, verbal cues required, and tactile cues required   HOME EXERCISE PROGRAM: Access Code: [email or web address] URL: [email or web address] Date: 07/09/2022 Prepared by: Daleen Bo  ASSESSMENT:  CLINICAL IMPRESSION: Pt able to tolerate previous level of exercise but likely exceeded current capacity for exercise by end of session as pt ended with increased R hip pain following lunging. Pt did begin to subside with passive floatation. Pt spasm still largely into R lumbar spine and posterior hip. Pt was able to nearly double amount of exercise during session. Consider decreasing volume of exercise at next session as well limiting prolonged warm up in pool. Plan to continue with improving gait, building hip and core strength, as well as improving functional capacity as pt is limited with muscle endurance. Pt would benefit from continued skilled therapy in order to reach goals and maximize functional lumbopelvic strength and ROM for prevention of further functional decline.   OBJECTIVE IMPAIRMENTS Abnormal gait, decreased activity tolerance, decreased balance, decreased endurance, decreased mobility, difficulty walking, decreased ROM, decreased strength, hypomobility, increased fascial restrictions, increased muscle spasms, impaired flexibility, improper body mechanics, postural dysfunction, and pain.    ACTIVITY LIMITATIONS carrying, lifting, bending, sitting, standing, squatting, stairs, transfers, and locomotion level  PARTICIPATION LIMITATIONS: cleaning, laundry, interpersonal relationship, driving, shopping, community activity, occupation, and exercise  PERSONAL FACTORS Age, Behavior pattern, Fitness, Past/current experiences, Time since onset of injury/illness/exacerbation, and 1-2 comorbidities:  are also affecting patient's functional outcome.   REHAB POTENTIAL: Fair   CLINICAL DECISION MAKING: Stable/uncomplicated  EVALUATION COMPLEXITY: Low   GOALS:   SHORT TERM GOALS: Target date: 08/20/2022   Pt will become independent with HEP in order to demonstrate synthesis of PT education.   Goal status: INITIAL  2.  Pt will report at least 2 pt reduction on NPRS scale for pain in order to demonstrate functional improvement with household activity, self care, and ADL.   Goal status: INITIAL  3.  Pt will be able to demonstrate ability to ascend and descend stairs with step to pattern without pain in order to demonstrate functional improvement in lumbar function for self-care and house hold duties.   Goal status: INITIAL   LONG TERM GOALS: Target date: 10/01/2022   Pt  will become independent with final HEP in order to demonstrate synthesis of PT education.   Goal status: INITIAL  2.  Pt will score >/= 46 on FOTO to demonstrate improvement in perceived lumbar function.   Goal status: INITIAL  3.  Pt will be able to demonstrate/report ability to walk >15 mins without pain in order to demonstrate functional improvement and tolerance to exercise and community mobility.   Goal status: INITIAL  4.  Pt will be able to demonstrate/report ability to sit/stand/sleep for extended periods of time without pain in order to demonstrate functional improvement and tolerance to static positioning.  Goal status: INITIAL  5. Pt will be able to perform 5XSTS in under 12s with or  without UE  in order to demonstrate functional improvement above the cut off score for adults.   Goal status: INITIAL     PLAN: PT FREQUENCY: 1-2x/week  PT DURATION: 12 weeks  PLANNED INTERVENTIONS: Therapeutic exercises, Therapeutic activity, Neuromuscular re-education, Balance training, Gait training, Patient/Family education, Self Care, Joint mobilization, Joint manipulation, Stair training, Orthotic/Fit training, DME instructions, Aquatic Therapy, Dry Needling, Electrical stimulation, Spinal manipulation, Spinal mobilization, Cryotherapy, Moist heat, scar mobilization, Splintting, Taping, Vasopneumatic device, Traction, Ultrasound, Ionotophoresis 52m/ml Dexamethasone, Manual therapy, and Re-evaluation  PLAN FOR NEXT SESSION: repeat aquatic; R anterior hip/thigh stretching, normalizing gait pattern, lumbopelvic strength and motor control  ADaleen BoPT, DPT 07/29/22 3:31 PM

## 2022-07-31 ENCOUNTER — Ambulatory Visit (HOSPITAL_BASED_OUTPATIENT_CLINIC_OR_DEPARTMENT_OTHER): Payer: BC Managed Care – PPO | Admitting: Physical Therapy

## 2022-07-31 ENCOUNTER — Encounter (HOSPITAL_BASED_OUTPATIENT_CLINIC_OR_DEPARTMENT_OTHER): Payer: Self-pay | Admitting: Physical Therapy

## 2022-07-31 DIAGNOSIS — R262 Difficulty in walking, not elsewhere classified: Secondary | ICD-10-CM

## 2022-07-31 DIAGNOSIS — M5459 Other low back pain: Secondary | ICD-10-CM | POA: Diagnosis not present

## 2022-07-31 DIAGNOSIS — M6281 Muscle weakness (generalized): Secondary | ICD-10-CM

## 2022-07-31 NOTE — Therapy (Signed)
OUTPATIENT PHYSICAL THERAPY THORACOLUMBAR    Patient Name: [name] MRN: [id number] DOB:[birth year], 61 y.o., female Today's Date: 07/31/2022   PT End of Session - 07/31/22 0827     Visit Number 4    Number of Visits 21    Date for PT Re-Evaluation 10/07/22    Authorization Type BCBS    PT Start Time 0815    PT Stop Time 0900    PT Time Calculation (min) 45 min    Activity Tolerance Patient tolerated treatment well;Patient limited by pain    Behavior During Therapy [facility] for tasks assessed/performed               Past Medical History:  Diagnosis Date   BPPV (benign paroxysmal positional vertigo)    [facility]est[facility] rehab ref-->07/15/18   Chronic headaches    Chronic low back pain    multilevel facet arthropathy   Chronic renal insufficiency, stage 2 (mild)    borderlined II/III (GF@59 )   Depression    Diabetes mellitus ([place]) 10/2019 prediab; 04/2020 DM 2   Fasting gluc low 120s, Hba1c 6.3%. June 2021 A1c 7%   History of adenomatous polyp of colon 03/09/2014   tubular adenoma 2011    Hyperlipidemia 10/2019   10 yr [place] CV risk= 3.5%->TLC.  LDL 170s + new dx DM 04/2020->pt declines any statin b/c of myalgis on 2 diff ones.   Migraine    NASH (nonalcoholic steatohepatitis) 03/21/2010   Qualifier: Diagnosis of  By: [name] MD, [name]    OSA (obstructive sleep apnea) 10/14/2012   Sleep study completed on 09/22/2012-interpreting physician Clinton D. Young MD  Impression: 1. 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 2. Moderate scarring with oxygen saturations were -87% with mean saturation to the study night at 92% on room air. 3. Regular cardiac rhythm and the heart rate is 60 beats per minute.    RMSF [facility] spotted fever) 2017/18   Lyme neg   Sacroiliac pain    chronic, left (Dr. Othelia Pulling eval 04/2021->SI injection helpful.   Tobacco dependence    switched to vap 2017   Past Surgical History:  Procedure Laterality Date   ABDOMINAL  HYSTERECTOMY  2010   [name]   CAROTID DOPPLERS Bilateral 07/24/2020   NORMAL   cataract surg     2021   CHOLECYSTECTOMY N/A 03/10/2014   Procedure: LAPAROSCOPIC CHOLECYSTECTOMY WITH INTRAOPERATIVE CHOLANGIOGRAM;  Surgeon: [name], MD;  Location: [facility];  Service: General;  Laterality: N/A;   COLONOSCOPY W/ BIOPSIES  2011; 03/2014   Dr [name] 2011.  [name] 2015 (polyps-->recall 5 yrs).   ESOPHAGOGASTRODUODENOSCOPY  2011   Dr [name]   KNEE ARTHROSCOPY  2015   LAPAROSCOPIC APPENDECTOMY  2009   [name]   RHYTHM MONITORING  07/2020   normal   sij fusion Right 12/23/2021   TEMPOROMANDIBULAR JOINT ARTHROPLASTY     TEMPOROMANDIBULAR JOINT SURGERY     TONSILLECTOMY     TRANSTHORACIC ECHOCARDIOGRAM  08/2020   COMPLETELY NORMAL   Patient Active Problem List   Diagnosis Date Noted   Sun-damaged skin 08/04/2017   Peripheral tear of medial meniscus of left knee as current injury 01/04/2017   Common migraine with intractable migraine 05/11/2016   Non-intractable cyclical vomiting without nausea 08/28/2015   Carbuncle, thigh 12/19/2014   Personal history of colonic polyps 03/09/2014   Nausea and vomiting 03/09/2014   Abdominal distension - diffuse 03/09/2014   Obesity (BMI 30-39.9) 03/09/2014  Tobacco abuse 03/09/2014   Skin lesion of cheek 09/08/2013   Low back pain 08/21/2013   OSA (obstructive sleep apnea) 10/14/2012   Hyperlipidemia 09/07/2012   Chronic fatigue 08/16/2012   Nonspecific elevation of levels of transaminase or lactic acid dehydrogenase (LDH) 08/16/2012   Tobacco use 08/16/2012   NASH (nonalcoholic steatohepatitis) 03/21/2010   IBS (irritable bowel syndrome) 11/26/2009    PCP: Percell Belt, DO  REFERRING PROVIDER: Phylliss Bob, MD  REFERRING DIAG: Low back pain  Rationale for Evaluation and Treatment Rehabilitation  THERAPY DIAG:  Other low back pain  Muscle weakness (generalized)  Difficulty walking  ONSET DATE:  2018  SUBJECTIVE:                                                                                                                                                                                           SUBJECTIVE STATEMENT:  Pt states that she had to take 2 pain killers last night and 1 muscle relaxer this morning.  "today is a terrible day"    Eval:  Pt states the pain all started after an SIJ fusion in Feb 2023. She states she has had the hip and back pain for 6 years now and Dr. Lynann Bologna performed her R SIJ fusion. Pt has tried TPDN and chiro visits. Pt states that she is 70% better than she was after surgery but still is not very good. She has not been able to tolerate land therapy very well. The later in the day, the worse the pain has been. Pt states she is only able to work from bed. She is unable to do any ADL at home due to the pain and weakness. Pt does not have NT down the legs but only into the groin.   She has been doing PT at [facility] since surgery but she has not gotten much better due to her poor gait quality and muscle weakness that does not respond well to . She has sciatica type symptoms as well. Pt will sometimes use an AD for anything over half an hour long.   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 Pt is here primarily for aquatic therapy.   Pt was doing PT on land: straight leg bridge, SLR, LTR while at home. Pt tried some TPDN and deep tissue massaging but no stretching at all.    PERTINENT HISTORY:  SIJ  fusion Dec 23 2021, TIA   PAIN:  Are you having pain? Yes: NPRS scale: 6/10  Pain location: anterior hip crease, R SIJ, deep inside R hip Pain description: sharp, pins and needles,  numbness, tearing feeling Aggravating factors: stairs, sitting too long, standing too long, walking, chores, cooking, showering Relieving factors: heat, meds, ice, leaning on a cart  PRECAUTIONS: None  WEIGHT BEARING RESTRICTIONS  No  FALLS:  Has patient fallen in last 6 months? No, but pain was so bad caused her knees to buckle  LIVING ENVIRONMENT: Lives with: lives with their family and lives with their spouse Lives in: House/apartment Stairs: yes Has following equipment at home: Single point cane and Environmental consultant - 4 wheeled  OCCUPATION: [employer]  PLOF: Independent with basic ADLs  PATIENT GOALS : be able to exercise again; pt is acutely aware of recent weight gain and loss of function   OBJECTIVE:   DIAGNOSTIC FINDINGS:  R hip MRI  IMPRESSION: 1. Postsurgical changes related to interval arthrodesis of the right sacroiliac joint. 2. No acute findings or explanation for the patient's symptoms. No significant hip arthropathy or evidence of labral tear.    Disc levels:   T12-L1: No significant disc protrusion, foraminal stenosis, or canal stenosis.   L1-L2: No significant disc protrusion, foraminal stenosis, or canal stenosis.   L2-L3: Slight retrolisthesis of L2 on L3. Small left foraminal disc protrusion without significant canal or foraminal stenosis. No significant change.   L3-L4: Mild disc height loss and desiccation. Mild disc bulging with mild left facet hypertrophy and ligamentum flavum thickening. No significant canal or foraminal stenosis. No significant change.   L4-L5: Mild disc bulging and mild bilateral facet hypertrophy and ligamentum flavum thickening. No significant canal or foraminal stenosis. No significant change.   L5-S1: Bilateral facet arthropathy without significant canal or foraminal stenosis.   IMPRESSION: Similar multilevel degenerative change (detailed above) without significant canal or foraminal stenosis.  PATIENT SURVEYS:  FOTO 37 46 @ DC     TODAY'S TREATMENT  9/22  Pt seen for aquatic therapy today.  Treatment took place in water 3.25-4 ft in depth at the [facility] pool. Temp of water was 92.  Pt entered/exited the pool via stairs (step to)  independently with bilat rail.   Warm up: 3x sidestepping, retro walking, fwd walking  -Straddle sit cycling 20x fwd and retro; scissor kicking   -standing hip flexor stretch R x 20s, L 20s - return to walking forward - TransAb set with short noodle submerged x5s x 10; then row - L stretch wall - adductor stretch with hips flexed  - return to deeper water with LE suspended, yellow noodle under arms - trial of R quad stretch with foot on 2nd step; R hamstring stretch with foot on 3rd step (not leaning forward); L stretch holding rails  - self care:  ball massage to glute musculature; pt returned demo with cues - application of Regular Rock tape applied in X pattern at R SI joint (after application of [employer]) with 25% stretch to decompress tissue and increase proprioception.   Pt requires buoyancy for support and to offload joints with strengthening exercises. Viscosity of the water is needed for resistance of strengthening; water current perturbations provides challenge to standing balance unsupported, requiring increased core activation.  9/20   Pt seen for aquatic therapy today.  Treatment took place in water 3.25-4 ft in depth at the [facility] pool. Temp of water was 90.  Pt entered/exited the pool via stairs (step to) independently with bilat rail.   Warm up: 3x sidestepping, retro walking, fwd walking   Exercises:  -standing hip flexor stretch 30s 3x - standing squats 2x10 without UE support  -  standing lumbar flexion stretch 10x fwd, 5x L and R  -Standing trunk rotation with noodle 20x -Kickboard press and row with TrA brace 2x20  -Lateral lunge stretch 10x 5s hold  -Straddle sit cycling 20x fwd and retro; scissor kicking   -Attempted fwd lunge but flared up pain  Walking between sets for recovery ; 1x seated rest break needed due to lumbar muscle spasm  9/13  Pt seen for aquatic therapy today.  Treatment took place in water 3.25-4 ft in depth at the  [facility] pool. Temp of water was 92.  Pt entered/exited the pool via stairs (step to) independently with bilat rail.   Warm up: 3x sidestepping, retro walking, fwd walking   Exercises:  standing hip flexor stretch 30s 3x  standing squats 2x10 without UE support  standing lumbar flexion stretch 10x fwd, 5x L and R (felt very good) Standing march 2x10 STS off bench 2x10 Straddle sit on noodle 30s 3x Straddle sit cycling 20x fwd and retro  Eval: Review of previous HEP with other PT and this is in addition to LTR and SLR; bridge with band to replace straight leg bridging  Exercises - Supine Posterior Pelvic Tilt  - 2 x daily - 7 x weekly - 2 sets - 10 reps - 2 hold - Neutral Lumbar Spine Curl Up  - 2 x daily - 7 x weekly - 2 sets - 10 reps - 2 hold - Standing Hip Flexor Stretch  - 2 x daily - 7 x weekly - 1 sets - 2 reps - 30 hold - Bridge with Hip Abduction and Resistance  - 2 x daily - 7 x weekly - 2 sets - 10 reps   PATIENT EDUCATION:  Education details: self care- self massage, info on TENS, tape rationale and safe removal guidelines  Person educated: Patient Education method: Explanation, Demonstration, Tactile cues, Verbal cues,  Education comprehension: verbalized understanding, returned demonstration, verbal cues required, and tactile cues required   HOME EXERCISE PROGRAM: Access Code: [email or web address] URL: [email or web address] Date: 07/09/2022 Prepared by: Daleen Bo  ASSESSMENT:  CLINICAL IMPRESSION: Pt with limited tolerance for exercises today.  Pain level remained at 6/10 throughout session.  She continues to have increased pain with any activation of R glute max.  Trial of self massage and ktape to Rt glute.  Pt would benefit from continued skilled therapy in order to reach goals and maximize functional lumbopelvic strength and ROM for prevention of further functional decline.   OBJECTIVE IMPAIRMENTS Abnormal gait, decreased activity tolerance,  decreased balance, decreased endurance, decreased mobility, difficulty walking, decreased ROM, decreased strength, hypomobility, increased fascial restrictions, increased muscle spasms, impaired flexibility, improper body mechanics, postural dysfunction, and pain.   ACTIVITY LIMITATIONS carrying, lifting, bending, sitting, standing, squatting, stairs, transfers, and locomotion level  PARTICIPATION LIMITATIONS: cleaning, laundry, interpersonal relationship, driving, shopping, community activity, occupation, and exercise  PERSONAL FACTORS Age, Behavior pattern, Fitness, Past/current experiences, Time since onset of injury/illness/exacerbation, and 1-2 comorbidities:  are also affecting patient's functional outcome.   REHAB POTENTIAL: Fair   CLINICAL DECISION MAKING: Stable/uncomplicated  EVALUATION COMPLEXITY: Low   GOALS:   SHORT TERM GOALS: Target date: 08/20/2022   Pt will become independent with HEP in order to demonstrate synthesis of PT education.   Goal status: INITIAL  2.  Pt will report at least 2 pt reduction on NPRS scale for pain in order to demonstrate functional improvement with household activity, self care, and ADL.   Goal status: INITIAL  3.  Pt  will be able to demonstrate ability to ascend and descend stairs with step to pattern without pain in order to demonstrate functional improvement in lumbar function for self-care and house hold duties.   Goal status: INITIAL   LONG TERM GOALS: Target date: 10/01/2022   Pt  will become independent with final HEP in order to demonstrate synthesis of PT education.   Goal status: INITIAL  2.  Pt will score >/= 46 on FOTO to demonstrate improvement in perceived lumbar function.   Goal status: INITIAL  3.  Pt will be able to demonstrate/report ability to walk >15 mins without pain in order to demonstrate functional improvement and tolerance to exercise and community mobility.   Goal status: INITIAL  4.  Pt will be able  to demonstrate/report ability to sit/stand/sleep for extended periods of time without pain in order to demonstrate functional improvement and tolerance to static positioning.  Goal status: INITIAL  5. Pt will be able to perform 5XSTS in under 12s with or without UE  in order to demonstrate functional improvement above the cut off score for adults.   Goal status: INITIAL     PLAN: PT FREQUENCY: 1-2x/week  PT DURATION: 12 weeks  PLANNED INTERVENTIONS: Therapeutic exercises, Therapeutic activity, Neuromuscular re-education, Balance training, Gait training, Patient/Family education, Self Care, Joint mobilization, Joint manipulation, Stair training, Orthotic/Fit training, DME instructions, Aquatic Therapy, Dry Needling, Electrical stimulation, Spinal manipulation, Spinal mobilization, Cryotherapy, Moist heat, scar mobilization, Splintting, Taping, Vasopneumatic device, Traction, Ultrasound, Ionotophoresis 23m/ml Dexamethasone, Manual therapy, and Re-evaluation  PLAN FOR NEXT SESSION: repeat aquatic; R anterior hip/thigh stretching, normalizing gait pattern, lumbopelvic strength and motor control -  Issue TENS info.   JKerin Perna PTA 07/31/22 9:24 AM [facility] [address] [place] [address]Phone: [phone]  Fax:  [phone]

## 2022-07-31 NOTE — Patient Instructions (Signed)
TENS UNIT: This is helpful for muscle pain and spasm.   Search and Purchase a TENS 7000 2nd edition at www.[email or web address]. It should be less than $30.     TENS unit instructions: Do not shower or bathe with the unit on Turn the unit off before removing electrodes or batteries If the electrodes lose stickiness add a drop of water to the electrodes after they are disconnected from the unit and place on plastic sheet. If you continued to have difficulty, call the TENS unit company to purchase more electrodes. Do not apply lotion on the skin area prior to use. Make sure the skin is clean and dry as this will help prolong the life of the electrodes. After use, always check skin for unusual red areas, rash or other skin difficulties. If there are any skin problems, does not apply electrodes to the same area. Never remove the electrodes from the unit by pulling the wires. Do not use the TENS unit or electrodes other than as directed. Do not change electrode placement without consultating your therapist or physician. Keep 2 fingers width between each electrode. Wear time ratio is 2:1, on to off times.    For example on for 30 minutes off for 15 minutes and then on for 30 minutes off for 15 minutes

## 2022-08-04 ENCOUNTER — Ambulatory Visit (HOSPITAL_BASED_OUTPATIENT_CLINIC_OR_DEPARTMENT_OTHER): Payer: BC Managed Care – PPO | Admitting: Physical Therapy

## 2022-08-04 ENCOUNTER — Encounter (HOSPITAL_BASED_OUTPATIENT_CLINIC_OR_DEPARTMENT_OTHER): Payer: Self-pay | Admitting: Physical Therapy

## 2022-08-04 DIAGNOSIS — R262 Difficulty in walking, not elsewhere classified: Secondary | ICD-10-CM

## 2022-08-04 DIAGNOSIS — M5459 Other low back pain: Secondary | ICD-10-CM

## 2022-08-04 DIAGNOSIS — M6281 Muscle weakness (generalized): Secondary | ICD-10-CM

## 2022-08-04 NOTE — Patient Instructions (Signed)
TENS UNIT: This is helpful for muscle pain and spasm.   Search and Purchase a TENS 7000 2nd edition at www.[email or web address]. It should be less than $30.     TENS unit instructions: Do not shower or bathe with the unit on Turn the unit off before removing electrodes or batteries If the electrodes lose stickiness add a drop of water to the electrodes after they are disconnected from the unit and place on plastic sheet. If you continued to have difficulty, call the TENS unit company to purchase more electrodes. Do not apply lotion on the skin area prior to use. Make sure the skin is clean and dry as this will help prolong the life of the electrodes. After use, always check skin for unusual red areas, rash or other skin difficulties. If there are any skin problems, does not apply electrodes to the same area. Never remove the electrodes from the unit by pulling the wires. Do not use the TENS unit or electrodes other than as directed. Do not change electrode placement without consultating your therapist or physician. Keep 2 fingers width between each electrode. Wear time ratio is 2:1, on to off times.    For example on for 30 minutes off for 15 minutes and then on for 30 minutes off for 15 minutes

## 2022-08-04 NOTE — Therapy (Signed)
OUTPATIENT PHYSICAL THERAPY THORACOLUMBAR    Patient Name: [name] MRN: [id number] DOB:03/06/1961, 61 y.o., female Today's Date: 08/04/2022   PT End of Session - 08/04/22 1533     Visit Number 5    Number of Visits 21    Date for PT Re-Evaluation 10/07/22    Authorization Type BCBS    PT Start Time 1530    PT Stop Time 1612    PT Time Calculation (min) 42 min    Activity Tolerance Patient tolerated treatment well    Behavior During Therapy WFL for tasks assessed/performed               Past Medical History:  Diagnosis Date   BPPV (benign paroxysmal positional vertigo)    [facility] rehab ref-->07/15/18   Chronic headaches    Chronic low back pain    multilevel facet arthropathy   Chronic renal insufficiency, stage 2 (mild)    borderlined II/III (GF@59 )   Depression    Diabetes mellitus ([place]) 10/2019 prediab; 04/2020 DM 2   Fasting gluc low 120s, Hba1c 6.3%. June 2021 A1c 7%   History of adenomatous polyp of colon 03/09/2014   tubular adenoma 2011    Hyperlipidemia 10/2019   10 yr [place] CV risk= 3.5%->TLC.  LDL 170s + new dx DM 04/2020->pt declines any statin b/c of myalgis on 2 diff ones.   Migraine    NASH (nonalcoholic steatohepatitis) 03/21/2010   Qualifier: Diagnosis of  By: [name] MD, [name]    OSA (obstructive sleep apnea) 10/14/2012   Sleep study completed on 09/22/2012-interpreting physician Clinton D. Young MD  Impression: 1. 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 2. Moderate scarring with oxygen saturations were -87% with mean saturation to the study night at 92% on room air. 3. Regular cardiac rhythm and the heart rate is 60 beats per minute.    [facility] spotted fever) 2017/18   Lyme neg   Sacroiliac pain    chronic, left (Dr. Othelia Pulling eval 04/2021->SI injection helpful.   Tobacco dependence    switched to vap 2017   Past Surgical History:  Procedure Laterality Date   ABDOMINAL HYSTERECTOMY  2010   [name]    CAROTID DOPPLERS Bilateral 07/24/2020   NORMAL   cataract surg     2021   CHOLECYSTECTOMY N/A 03/10/2014   Procedure: LAPAROSCOPIC CHOLECYSTECTOMY WITH INTRAOPERATIVE CHOLANGIOGRAM;  Surgeon: [name], MD;  Location: [facility];  Service: General;  Laterality: N/A;   COLONOSCOPY W/ BIOPSIES  2011; 03/2014   Dr [name] 2011.  [name] 2015 (polyps-->recall 5 yrs).   ESOPHAGOGASTRODUODENOSCOPY  2011   Dr [name]   KNEE ARTHROSCOPY  2015   LAPAROSCOPIC APPENDECTOMY  2009   [name]   RHYTHM MONITORING  07/2020   normal   sij fusion Right 12/23/2021   TEMPOROMANDIBULAR JOINT ARTHROPLASTY     TEMPOROMANDIBULAR JOINT SURGERY     TONSILLECTOMY     TRANSTHORACIC ECHOCARDIOGRAM  08/2020   COMPLETELY NORMAL   Patient Active Problem List   Diagnosis Date Noted   Sun-damaged skin 08/04/2017   Peripheral tear of medial meniscus of left knee as current injury 01/04/2017   Common migraine with intractable migraine 05/11/2016   Non-intractable cyclical vomiting without nausea 08/28/2015   Carbuncle, thigh 12/19/2014   Personal history of colonic polyps 03/09/2014   Nausea and vomiting 03/09/2014   Abdominal distension - diffuse 03/09/2014   Obesity (BMI 30-39.9) 03/09/2014   Tobacco abuse  03/09/2014   Skin lesion of cheek 09/08/2013   Low back pain 08/21/2013   OSA (obstructive sleep apnea) 10/14/2012   Hyperlipidemia 09/07/2012   Chronic fatigue 08/16/2012   Nonspecific elevation of levels of transaminase or lactic acid dehydrogenase (LDH) 08/16/2012   Tobacco use 08/16/2012   NASH (nonalcoholic steatohepatitis) 03/21/2010   IBS (irritable bowel syndrome) 11/26/2009    PCP: Percell Belt, DO  REFERRING PROVIDER: Phylliss Bob, MD  REFERRING DIAG: Low back pain  Rationale for Evaluation and Treatment Rehabilitation  THERAPY DIAG:  Other low back pain  Muscle weakness (generalized)  Difficulty walking  ONSET DATE: 2018  SUBJECTIVE:                                                                                                                                                                                            SUBJECTIVE STATEMENT:  "Today is a good day".  Pt reports the (self) massage to glute was painful last session.  She reports she hasn't taken pain medicine since Saturday.     Eval:  Pt states the pain all started after an SIJ fusion in Feb 2023. She states she has had the hip and back pain for 6 years now and Dr. Lynann Bologna performed her R SIJ fusion. Pt has tried TPDN and chiro visits. Pt states that she is 70% better than she was after surgery but still is not very good. She has not been able to tolerate land therapy very well. The later in the day, the worse the pain has been. Pt states she is only able to work from bed. She is unable to do any ADL at home due to the pain and weakness. Pt does not have NT down the legs but only into the groin.   She has been doing PT at [facility] since surgery but she has not gotten much better due to her poor gait quality and muscle weakness that does not respond well to . She has sciatica type symptoms as well. Pt will sometimes use an AD for anything over half an hour long.   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 Pt is here primarily for aquatic therapy.   Pt was doing PT on land: straight leg bridge, SLR, LTR while at home. Pt tried some TPDN and deep tissue massaging but no stretching at all.    PERTINENT HISTORY:  SIJ  fusion Dec 23 2021, TIA   PAIN:  Are you having pain? Yes: NPRS scale: 3/10  Pain location:R SIJ, deep Pain description: achy Aggravating factors: stairs, sitting too long, standing too  long, walking, chores, cooking, showering Relieving factors: heat, meds, ice, leaning on a cart  PRECAUTIONS: None  WEIGHT BEARING RESTRICTIONS No  FALLS:  Has patient fallen in last 6 months? No, but pain was so bad caused her knees to buckle  LIVING  ENVIRONMENT: Lives with: lives with their family and lives with their spouse Lives in: House/apartment Stairs: yes Has following equipment at home: Single point cane and Environmental consultant - 4 wheeled  OCCUPATION: [employer]  PLOF: Independent with basic ADLs  PATIENT GOALS : be able to exercise again; pt is acutely aware of recent weight gain and loss of function   OBJECTIVE:   DIAGNOSTIC FINDINGS:  R hip MRI  IMPRESSION: 1. Postsurgical changes related to interval arthrodesis of the right sacroiliac joint. 2. No acute findings or explanation for the patient's symptoms. No significant hip arthropathy or evidence of labral tear.    Disc levels:   T12-L1: No significant disc protrusion, foraminal stenosis, or canal stenosis.   L1-L2: No significant disc protrusion, foraminal stenosis, or canal stenosis.   L2-L3: Slight retrolisthesis of L2 on L3. Small left foraminal disc protrusion without significant canal or foraminal stenosis. No significant change.   L3-L4: Mild disc height loss and desiccation. Mild disc bulging with mild left facet hypertrophy and ligamentum flavum thickening. No significant canal or foraminal stenosis. No significant change.   L4-L5: Mild disc bulging and mild bilateral facet hypertrophy and ligamentum flavum thickening. No significant canal or foraminal stenosis. No significant change.   L5-S1: Bilateral facet arthropathy without significant canal or foraminal stenosis.   IMPRESSION: Similar multilevel degenerative change (detailed above) without significant canal or foraminal stenosis.  PATIENT SURVEYS:  FOTO 37 46 @ DC     TODAY'S TREATMENT  9/26  Pt seen for aquatic therapy today.  Treatment took place in water 3.25-4 ft in depth at the [facility] pool. Temp of water was 92.  Pt entered/exited the pool via stairs (step to) independently with bilat rail.   Warm up: 3x sidestepping, retro walking, fwd walking  -Squats, pushing rainbow  hand buoy under water x 10 - TransAb set with thin square noodle submerged x5s x 10; kickboard row in wide stance; trunk rotation with arms resting on board - holding yellow noodle: 3 way leg kick, alternating LE with cues to limit height of kick x 5 each LE -Straddle sit cycling 20x fwd and retro; scissor kicking  - L stretch at wall; side to side lunge for adductor stretch  -standing hip flexor stretch R x 20s, L 20s - split squat holding wall - x 10 each  - return to walking at < 4 ft (good tolerance)  - holding wall- mini curtsy - braiding at 4+ ft  - return to deeper water with LE suspended, yellow noodle under arms  - application of Regular Rock tape applied in X pattern at R SI joint (after application of [employer]) with 25% stretch to decompress tissue and increase proprioception.   Pt requires buoyancy for support and to offload joints with strengthening exercises. Viscosity of the water is needed for resistance of strengthening; water current perturbations provides challenge to standing balance unsupported, requiring increased core activation.  9/22  Pt seen for aquatic therapy today.  Treatment took place in water 3.25-4 ft in depth at the [facility] pool. Temp of water was 92.  Pt entered/exited the pool via stairs (step to) independently with bilat rail.   Warm up: 3x sidestepping, retro walking, fwd walking  -  Straddle sit cycling 20x fwd and retro; scissor kicking   -standing hip flexor stretch R x 20s, L 20s - return to walking forward - TransAb set with short noodle submerged x5s x 10; then row - L stretch wall - adductor stretch with hips flexed  - return to deeper water with LE suspended, yellow noodle under arms - trial of R quad stretch with foot on 2nd step; R hamstring stretch with foot on 3rd step (not leaning forward); L stretch holding rails  - self care:  ball massage to glute musculature; pt returned demo with cues - application of Regular Rock  tape applied in X pattern at R SI joint (after application of [employer]) with 25% stretch to decompress tissue and increase proprioception.   Pt requires buoyancy for support and to offload joints with strengthening exercises. Viscosity of the water is needed for resistance of strengthening; water current perturbations provides challenge to standing balance unsupported, requiring increased core activation.  9/20 Pt seen for aquatic therapy today.  Treatment took place in water 3.25-4 ft in depth at the [facility] pool. Temp of water was 90.  Pt entered/exited the pool via stairs (step to) independently with bilat rail.   Warm up: 3x sidestepping, retro walking, fwd walking   Exercises:  -standing hip flexor stretch 30s 3x - standing squats 2x10 without UE support  -standing lumbar flexion stretch 10x fwd, 5x L and R  -Standing trunk rotation with noodle 20x -Kickboard press and row with TrA brace 2x20  -Lateral lunge stretch 10x 5s hold  -Straddle sit cycling 20x fwd and retro; scissor kicking   -Attempted fwd lunge but flared up pain  Walking between sets for recovery ; 1x seated rest break needed due to lumbar muscle spasm  9/13  Pt seen for aquatic therapy today.  Treatment took place in water 3.25-4 ft in depth at the [facility] pool. Temp of water was 92.  Pt entered/exited the pool via stairs (step to) independently with bilat rail.   Warm up: 3x sidestepping, retro walking, fwd walking   Exercises:  standing hip flexor stretch 30s 3x  standing squats 2x10 without UE support  standing lumbar flexion stretch 10x fwd, 5x L and R (felt very good) Standing march 2x10 STS off bench 2x10 Straddle sit on noodle 30s 3x Straddle sit cycling 20x fwd and retro  Eval: Review of previous HEP with other PT and this is in addition to LTR and SLR; bridge with band to replace straight leg bridging  Exercises - Supine Posterior Pelvic Tilt  - 2 x daily - 7 x  weekly - 2 sets - 10 reps - 2 hold - Neutral Lumbar Spine Curl Up  - 2 x daily - 7 x weekly - 2 sets - 10 reps - 2 hold - Standing Hip Flexor Stretch  - 2 x daily - 7 x weekly - 1 sets - 2 reps - 30 hold - Bridge with Hip Abduction and Resistance  - 2 x daily - 7 x weekly - 2 sets - 10 reps   PATIENT EDUCATION:  Education details:  info on TENS- handout Person educated: Patient Education method: Explanation, Demonstration, Tactile cues, Verbal cues,  Education comprehension: verbalized understanding, returned demonstration, verbal cues required, and tactile cues required   HOME EXERCISE PROGRAM: Access Code: [email or web address] URL: [email or web address] Date: 07/09/2022 Prepared by: Daleen Bo  ASSESSMENT:  CLINICAL IMPRESSION: Single rep of marching, irritated Rt groin and SI area, pain up to 6/10.  Needs to limit height of LE with 3 way kicks to avoid aggravating SI.  Some relief (to 3.5/10) with suspension in deeper water while sitting on noodle. Able to walk in water less than 4 ft with greater ease and less pain.  Pain reduced to 2.5/10 by end of session.  Another trial of Rock tape applied to Rt SI area.  Pt would benefit from continued skilled therapy in order to reach goals and maximize functional lumbopelvic strength and ROM for prevention of further functional decline.   OBJECTIVE IMPAIRMENTS Abnormal gait, decreased activity tolerance, decreased balance, decreased endurance, decreased mobility, difficulty walking, decreased ROM, decreased strength, hypomobility, increased fascial restrictions, increased muscle spasms, impaired flexibility, improper body mechanics, postural dysfunction, and pain.   ACTIVITY LIMITATIONS carrying, lifting, bending, sitting, standing, squatting, stairs, transfers, and locomotion level  PARTICIPATION LIMITATIONS: cleaning, laundry, interpersonal relationship, driving, shopping, community activity, occupation, and exercise  PERSONAL FACTORS Age,  Behavior pattern, Fitness, Past/current experiences, Time since onset of injury/illness/exacerbation, and 1-2 comorbidities:  are also affecting patient's functional outcome.   REHAB POTENTIAL: Fair   CLINICAL DECISION MAKING: Stable/uncomplicated  EVALUATION COMPLEXITY: Low   GOALS:   SHORT TERM GOALS: Target date: 08/20/2022   Pt will become independent with HEP in order to demonstrate synthesis of PT education.   Goal status: INITIAL  2.  Pt will report at least 2 pt reduction on NPRS scale for pain in order to demonstrate functional improvement with household activity, self care, and ADL.   Goal status: INITIAL  3.  Pt will be able to demonstrate ability to ascend and descend stairs with step to pattern without pain in order to demonstrate functional improvement in lumbar function for self-care and house hold duties.   Goal status: INITIAL   LONG TERM GOALS: Target date: 10/01/2022   Pt  will become independent with final HEP in order to demonstrate synthesis of PT education.   Goal status: INITIAL  2.  Pt will score >/= 46 on FOTO to demonstrate improvement in perceived lumbar function.   Goal status: INITIAL  3.  Pt will be able to demonstrate/report ability to walk >15 mins without pain in order to demonstrate functional improvement and tolerance to exercise and community mobility.   Goal status: INITIAL  4.  Pt will be able to demonstrate/report ability to sit/stand/sleep for extended periods of time without pain in order to demonstrate functional improvement and tolerance to static positioning.  Goal status: INITIAL  5. Pt will be able to perform 5XSTS in under 12s with or without UE  in order to demonstrate functional improvement above the cut off score for adults.   Goal status: INITIAL     PLAN: PT FREQUENCY: 1-2x/week  PT DURATION: 12 weeks  PLANNED INTERVENTIONS: Therapeutic exercises, Therapeutic activity, Neuromuscular re-education, Balance  training, Gait training, Patient/Family education, Self Care, Joint mobilization, Joint manipulation, Stair training, Orthotic/Fit training, DME instructions, Aquatic Therapy, Dry Needling, Electrical stimulation, Spinal manipulation, Spinal mobilization, Cryotherapy, Moist heat, scar mobilization, Splintting, Taping, Vasopneumatic device, Traction, Ultrasound, Ionotophoresis 84m/ml Dexamethasone, Manual therapy, and Re-evaluation  PLAN FOR NEXT SESSION: repeat aquatic; R anterior hip/thigh stretching, normalizing gait pattern, lumbopelvic strength and motor control   JKerin Perna PTA 08/04/22 4:19 PM CAtl[facility] CityRe[facility] [address] [address]Phone: [phone]  Fax:  [phone]

## 2022-08-06 ENCOUNTER — Ambulatory Visit (HOSPITAL_BASED_OUTPATIENT_CLINIC_OR_DEPARTMENT_OTHER): Payer: BC Managed Care – PPO | Admitting: Physical Therapy

## 2022-08-06 ENCOUNTER — Encounter (HOSPITAL_BASED_OUTPATIENT_CLINIC_OR_DEPARTMENT_OTHER): Payer: Self-pay | Admitting: Physical Therapy

## 2022-08-06 DIAGNOSIS — R262 Difficulty in walking, not elsewhere classified: Secondary | ICD-10-CM

## 2022-08-06 DIAGNOSIS — M5459 Other low back pain: Secondary | ICD-10-CM

## 2022-08-06 DIAGNOSIS — M6281 Muscle weakness (generalized): Secondary | ICD-10-CM

## 2022-08-06 NOTE — Therapy (Signed)
OUTPATIENT PHYSICAL THERAPY THORACOLUMBAR    Patient Name: [name] MRN: [id number] DOB:[birth year], 61 y.o., female Today's Date: 08/06/2022   PT End of Session - 08/06/22 1537     Visit Number 6    Number of Visits 21    Date for PT Re-Evaluation 10/07/22    Authorization Type BCBS    PT Start Time 1531    PT Stop Time 1612    PT Time Calculation (min) 41 min    Activity Tolerance Patient tolerated treatment well    Behavior During Therapy WFL for tasks assessed/performed               Past Medical History:  Diagnosis Date   BPPV (benign paroxysmal positional vertigo)    [facility] rehab ref-->07/15/18   Chronic headaches    Chronic low back pain    multilevel facet arthropathy   Chronic renal insufficiency, stage 2 (mild)    borderlined II/III (GF@59 )   Depression    Diabetes mellitus ([place]) 10/2019 prediab; 04/2020 DM 2   Fasting gluc low 120s, Hba1c 6.3%. June 2021 A1c 7%   History of adenomatous polyp of colon 03/09/2014   tubular adenoma 2011    Hyperlipidemia 10/2019   10 yr [place] CV risk= 3.5%->TLC.  LDL 170s + new dx DM 04/2020->pt declines any statin b/c of myalgis on 2 diff ones.   Migraine    NASH (nonalcoholic steatohepatitis) 03/21/2010   Qualifier: Diagnosis of  By: [name] MD, [name]    OSA (obstructive sleep apnea) 10/14/2012   Sleep study completed on 09/22/2012-interpreting physician Clinton D. Young MD  Impression: 1. 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 2. Moderate scarring with oxygen saturations were -87% with mean saturation to the study night at 92% on room air. 3. Regular cardiac rhythm and the heart rate is 60 beats per minute.    RMSF [facility] spotted fever) 2017/18   Lyme neg   Sacroiliac pain    chronic, left (Dr. Othelia Pulling eval 04/2021->SI injection helpful.   Tobacco dependence    switched to vap 2017   Past Surgical History:  Procedure Laterality Date   ABDOMINAL HYSTERECTOMY  2010   [name]    CAROTID DOPPLERS Bilateral 07/24/2020   NORMAL   cataract surg     2021   CHOLECYSTECTOMY N/A 03/10/2014   Procedure: LAPAROSCOPIC CHOLECYSTECTOMY WITH INTRAOPERATIVE CHOLANGIOGRAM;  Surgeon: [name], MD;  Location: [facility];  Service: General;  Laterality: N/A;   COLONOSCOPY W/ BIOPSIES  2011; 03/2014   Dr [name] 2011.  [name] 2015 (polyps-->recall 5 yrs).   ESOPHAGOGASTRODUODENOSCOPY  2011   Dr [name]   KNEE ARTHROSCOPY  2015   LAPAROSCOPIC APPENDECTOMY  2009   [name]   RHYTHM MONITORING  07/2020   normal   sij fusion Right 12/23/2021   TEMPOROMANDIBULAR JOINT ARTHROPLASTY     TEMPOROMANDIBULAR JOINT SURGERY     TONSILLECTOMY     TRANSTHORACIC ECHOCARDIOGRAM  08/2020   COMPLETELY NORMAL   Patient Active Problem List   Diagnosis Date Noted   Sun-damaged skin 08/04/2017   Peripheral tear of medial meniscus of left knee as current injury 01/04/2017   Common migraine with intractable migraine 05/11/2016   Non-intractable cyclical vomiting without nausea 08/28/2015   Carbuncle, thigh 12/19/2014   Personal history of colonic polyps 03/09/2014   Nausea and vomiting 03/09/2014   Abdominal distension - diffuse 03/09/2014   Obesity (BMI 30-39.9) 03/09/2014   Tobacco abuse  03/09/2014   Skin lesion of cheek 09/08/2013   Low back pain 08/21/2013   OSA (obstructive sleep apnea) 10/14/2012   Hyperlipidemia 09/07/2012   Chronic fatigue 08/16/2012   Nonspecific elevation of levels of transaminase or lactic acid dehydrogenase (LDH) 08/16/2012   Tobacco use 08/16/2012   NASH (nonalcoholic steatohepatitis) 03/21/2010   IBS (irritable bowel syndrome) 11/26/2009    PCP: Percell Belt, DO  REFERRING PROVIDER: Phylliss Bob, MD  REFERRING DIAG: Low back pain  Rationale for Evaluation and Treatment Rehabilitation  THERAPY DIAG:  Other low back pain  Muscle weakness (generalized)  Difficulty walking  ONSET DATE: 2018  SUBJECTIVE:                                                                                                                                                                                            SUBJECTIVE STATEMENT:  Pt tried to clean a window today (window laying on deck); leaning over ~15 min but pain in Rt SI area increased.   She is traveling to [facility] tomorrow for game; is driving there but plans to make many stops.    Eval:  Pt states the pain all started after an SIJ fusion in Feb 2023. She states she has had the hip and back pain for 6 years now and Dr. Lynann Bologna performed her R SIJ fusion. Pt has tried TPDN and chiro visits. Pt states that she is 70% better than she was after surgery but still is not very good. She has not been able to tolerate land therapy very well. The later in the day, the worse the pain has been. Pt states she is only able to work from bed. She is unable to do any ADL at home due to the pain and weakness. Pt does not have NT down the legs but only into the groin.   She has been doing PT at [facility] since surgery but she has not gotten much better due to her poor gait quality and muscle weakness that does not respond well to . She has sciatica type symptoms as well. Pt will sometimes use an AD for anything over half an hour long.   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 Pt is here primarily for aquatic therapy.   Pt was doing PT on land: straight leg bridge, SLR, LTR while at home. Pt tried some TPDN and deep tissue massaging but no stretching at all.    PERTINENT HISTORY:  SIJ  fusion Dec 23 2021, TIA   PAIN:  Are you having pain? Yes: NPRS scale: 4/10  Pain location:R  SIJ, deep Pain description: achy Aggravating factors: stairs, sitting too long, standing too long, walking, chores, cooking, showering Relieving factors: heat, meds, ice, leaning on a cart  PRECAUTIONS: None  WEIGHT BEARING RESTRICTIONS No  FALLS:  Has patient fallen in last 6 months?  No, but pain was so bad caused her knees to buckle  LIVING ENVIRONMENT: Lives with: lives with their family and lives with their spouse Lives in: House/apartment Stairs: yes Has following equipment at home: Single point cane and Environmental consultant - 4 wheeled  OCCUPATION: [employer]  PLOF: Independent with basic ADLs  PATIENT GOALS : be able to exercise again; pt is acutely aware of recent weight gain and loss of function   OBJECTIVE:   DIAGNOSTIC FINDINGS:  R hip MRI  IMPRESSION: 1. Postsurgical changes related to interval arthrodesis of the right sacroiliac joint. 2. No acute findings or explanation for the patient's symptoms. No significant hip arthropathy or evidence of labral tear.    Disc levels:   T12-L1: No significant disc protrusion, foraminal stenosis, or canal stenosis.   L1-L2: No significant disc protrusion, foraminal stenosis, or canal stenosis.   L2-L3: Slight retrolisthesis of L2 on L3. Small left foraminal disc protrusion without significant canal or foraminal stenosis. No significant change.   L3-L4: Mild disc height loss and desiccation. Mild disc bulging with mild left facet hypertrophy and ligamentum flavum thickening. No significant canal or foraminal stenosis. No significant change.   L4-L5: Mild disc bulging and mild bilateral facet hypertrophy and ligamentum flavum thickening. No significant canal or foraminal stenosis. No significant change.   L5-S1: Bilateral facet arthropathy without significant canal or foraminal stenosis.   IMPRESSION: Similar multilevel degenerative change (detailed above) without significant canal or foraminal stenosis.  PATIENT SURVEYS:  FOTO 36 46 @ DC     TODAY'S TREATMENT  Pt seen for aquatic therapy today.  Treatment took place in water 3.25-4.75 ft in depth at the [facility]. Temp of water was 92.  Pt entered/exited the pool via stairs (step to) independently with bilat rail.   Warm up: sidestepping,  retro walking, fwd walking in 67f 8" water - holding wall:  Rt hip circles (very small) CW/CCW -Squats, pushing rainbow hand buoy under water x 10; yellow x 10 - at bench with blue step under feet; STS with eccentric lowering x 10 - Plank with small hip ext x 10; gentle fire hydrants x 10 each - TransAb set with small blue noodle submerged x5 ( limited tolerance) - Straddle sit cycling 20x fwd and retro; scissor kicking; gentle cc ski  -  holding yellow noodle at 4 ft : split squat x 10 each leg - LE decompression with yellow noodle under arms, to reduce pain  Pt requires buoyancy for support and to offload joints with strengthening exercises. Viscosity of the water is needed for resistance of strengthening; water current perturbations provides challenge to standing balance unsupported, requiring increased core activation.  9/22  Pt seen for aquatic therapy today.  Treatment took place in water 3.25-4 ft [facility] Temp of water was 92.  Pt entered/exited the pool via stairs (step to) independently with bilat rail.   Warm up: 3x sidestepping, retro walking, fwd walking  -Straddle sit cycling 20x fwd and retro; scissor kicking   -standing hip flexor stretch R x 20s, L 20s - return to walking forward - TransAb set with short noodle submerged x5s x 10; then row - L stretch wall - adductor stretch  with hips flexed  - return to deeper water with LE suspended, yellow noodle under arms - trial of R quad stretch with foot on 2nd step; R hamstring stretch with foot on 3rd step (not leaning forward); L stretch holding rails  - self care:  ball massage to glute musculature; pt returned demo with cues - application of Regular Rock tape applied in X pattern at R SI joint (after application of [employer]) with 25% stretch to decompress tissue and increase proprioception.   Pt requires buoyancy for support and to offload joints with strengthening exercises. Viscosity of  the water is needed for resistance of strengthening; water current perturbations provides challenge to standing balance unsupported, requiring increased core activation.   PATIENT EDUCATION:  Education details:  aquatics progressions Person educated: Patient Education method: Education officer, environmental, Corporate treasurer cues, Verbal cues,  Education comprehension: verbalized understanding, returned demonstration, verbal cues required, and tactile cues required   HOME EXERCISE PROGRAM: Access Code: [email or web address] URL: [email or web address] Date: 07/09/2022 Prepared by: Daleen Bo  ASSESSMENT:  CLINICAL IMPRESSION: Last few reps of split squat with RLE in back flared R SI joint up; relieved with LE suspended (UE hanging on noodle). Needs to limit height of LE with 3 way kicks to avoid aggravating SI.   Pain reduced to 2/10 by end of session. Overall, pt reporting improvement in mobility since starting therapy.  Encouraged pt to avoid prolonged positions (sitting for work/ driving/ leaning over to clean or paint window).    Pt would benefit from continued skilled therapy in order to reach goals and maximize functional lumbopelvic strength and ROM for prevention of further functional decline.   OBJECTIVE IMPAIRMENTS Abnormal gait, decreased activity tolerance, decreased balance, decreased endurance, decreased mobility, difficulty walking, decreased ROM, decreased strength, hypomobility, increased fascial restrictions, increased muscle spasms, impaired flexibility, improper body mechanics, postural dysfunction, and pain.   ACTIVITY LIMITATIONS carrying, lifting, bending, sitting, standing, squatting, stairs, transfers, and locomotion level  PARTICIPATION LIMITATIONS: cleaning, laundry, interpersonal relationship, driving, shopping, community activity, occupation, and exercise  PERSONAL FACTORS Age, Behavior pattern, Fitness, Past/current experiences, Time since onset of  injury/illness/exacerbation, and 1-2 comorbidities:  are also affecting patient's functional outcome.   REHAB POTENTIAL: Fair   CLINICAL DECISION MAKING: Stable/uncomplicated  EVALUATION COMPLEXITY: Low   GOALS:   SHORT TERM GOALS: Target date: 08/20/2022   Pt will become independent with HEP in order to demonstrate synthesis of PT education.   Goal status: INITIAL  2.  Pt will report at least 2 pt reduction on NPRS scale for pain in order to demonstrate functional improvement with household activity, self care, and ADL.   Goal status: INITIAL  3.  Pt will be able to demonstrate ability to ascend and descend stairs with step to pattern without pain in order to demonstrate functional improvement in lumbar function for self-care and house hold duties.   Goal status: INITIAL   LONG TERM GOALS: Target date: 10/01/2022   Pt  will become independent with final HEP in order to demonstrate synthesis of PT education.   Goal status: INITIAL  2.  Pt will score >/= 46 on FOTO to demonstrate improvement in perceived lumbar function.   Goal status: INITIAL  3.  Pt will be able to demonstrate/report ability to walk >15 mins without pain in order to demonstrate functional improvement and tolerance to exercise and community mobility.   Goal status: INITIAL  4.  Pt will be able to demonstrate/report ability to sit/stand/sleep for extended periods of time without pain  in order to demonstrate functional improvement and tolerance to static positioning.  Goal status: INITIAL  5. Pt will be able to perform 5XSTS in under 12s with or without UE  in order to demonstrate functional improvement above the cut off score for adults.   Goal status: INITIAL     PLAN: PT FREQUENCY: 1-2x/week  PT DURATION: 12 weeks  PLANNED INTERVENTIONS: Therapeutic exercises, Therapeutic activity, Neuromuscular re-education, Balance training, Gait training, Patient/Family education, Self Care, Joint  mobilization, Joint manipulation, Stair training, Orthotic/Fit training, DME instructions, Aquatic Therapy, Dry Needling, Electrical stimulation, Spinal manipulation, Spinal mobilization, Cryotherapy, Moist heat, scar mobilization, Splintting, Taping, Vasopneumatic device, Traction, Ultrasound, Ionotophoresis 80m/ml Dexamethasone, Manual therapy, and Re-evaluation  PLAN FOR NEXT SESSION: repeat aquatic; R anterior hip/thigh stretching, normalizing gait pattern, lumbopelvic strength and motor control   JKerin Perna PTA 08/06/22 4:31 PM [facility] [address] StreetGDimock N[place] [id number]: [phone]  Fax:  [phone]

## 2022-08-11 ENCOUNTER — Encounter (HOSPITAL_BASED_OUTPATIENT_CLINIC_OR_DEPARTMENT_OTHER): Payer: Self-pay | Admitting: Physical Therapy

## 2022-08-11 ENCOUNTER — Ambulatory Visit (HOSPITAL_BASED_OUTPATIENT_CLINIC_OR_DEPARTMENT_OTHER): Payer: BC Managed Care – PPO | Attending: Orthopedic Surgery | Admitting: Physical Therapy

## 2022-08-11 DIAGNOSIS — M5459 Other low back pain: Secondary | ICD-10-CM | POA: Diagnosis not present

## 2022-08-11 DIAGNOSIS — M6281 Muscle weakness (generalized): Secondary | ICD-10-CM | POA: Diagnosis present

## 2022-08-11 DIAGNOSIS — R262 Difficulty in walking, not elsewhere classified: Secondary | ICD-10-CM | POA: Diagnosis present

## 2022-08-11 NOTE — Therapy (Signed)
OUTPATIENT PHYSICAL THERAPY THORACOLUMBAR    Patient Name: [name] MRN: [id number] DOB:[birth year], 61 y.o., female Today's Date: 08/11/2022   PT End of Session - 08/11/22 1626     Visit Number 7    Number of Visits 21    Date for PT Re-Evaluation 10/07/22    Authorization Type BCBS    PT Start Time 1446    PT Stop Time 1530    PT Time Calculation (min) 44 min    Activity Tolerance Patient tolerated treatment well    Behavior During Therapy WFL for tasks assessed/performed                Past Medical History:  Diagnosis Date   BPPV (benign paroxysmal positional vertigo)    [facility] rehab ref-->07/15/18   Chronic headaches    Chronic low back pain    multilevel facet arthropathy   Chronic renal insufficiency, stage 2 (mild)    borderlined II/III (GF@59 )   Depression    Diabetes mellitus ([place]) 10/2019 prediab; 04/2020 DM 2   Fasting gluc low 120s, Hba1c 6.3%. June 2021 A1c 7%   History of adenomatous polyp of colon 03/09/2014   tubular adenoma 2011    Hyperlipidemia 10/2019   10 yr [place] CV risk= 3.5%->TLC.  LDL 170s + new dx DM 04/2020->pt declines any statin b/c of myalgis on 2 diff ones.   Migraine    NASH (nonalcoholic steatohepatitis) 03/21/2010   Qualifier: Diagnosis of  By: [name] MD, [name]    OSA (obstructive sleep apnea) 10/14/2012   Sleep study completed on 09/22/2012-interpreting physician Clinton D. Young MD  Impression: 1. 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 2. Moderate scarring with oxygen saturations were -87% with mean saturation to the study night at 92% on room air. 3. Regular cardiac rhythm and the heart rate is 60 beats per minute.    [facility] spotted fever) 2017/18   Lyme neg   Sacroiliac pain    chronic, left (Dr. Othelia Pulling eval 04/2021->SI injection helpful.   Tobacco dependence    switched to vap 2017   Past Surgical History:  Procedure Laterality Date   ABDOMINAL HYSTERECTOMY  2010   [name]    CAROTID DOPPLERS Bilateral 07/24/2020   NORMAL   cataract surg     2021   CHOLECYSTECTOMY N/A 03/10/2014   Procedure: LAPAROSCOPIC CHOLECYSTECTOMY WITH INTRAOPERATIVE CHOLANGIOGRAM;  Surgeon: [name], MD;  Location: [facility];  Service: General;  Laterality: N/A;   COLONOSCOPY W/ BIOPSIES  2011; 03/2014   Dr [name] 2011.  [name] 2015 (polyps-->recall 5 yrs).   ESOPHAGOGASTRODUODENOSCOPY  2011   Dr [name]   KNEE ARTHROSCOPY  2015   LAPAROSCOPIC APPENDECTOMY  2009   [name]   RHYTHM MONITORING  07/2020   normal   sij fusion Right 12/23/2021   TEMPOROMANDIBULAR JOINT ARTHROPLASTY     TEMPOROMANDIBULAR JOINT SURGERY     TONSILLECTOMY     TRANSTHORACIC ECHOCARDIOGRAM  08/2020   COMPLETELY NORMAL   Patient Active Problem List   Diagnosis Date Noted   Sun-damaged skin 08/04/2017   Peripheral tear of medial meniscus of left knee as current injury 01/04/2017   Common migraine with intractable migraine 05/11/2016   Non-intractable cyclical vomiting without nausea 08/28/2015   Carbuncle, thigh 12/19/2014   Personal history of colonic polyps 03/09/2014   Nausea and vomiting 03/09/2014   Abdominal distension - diffuse 03/09/2014   Obesity (BMI 30-39.9) 03/09/2014   Tobacco  abuse 03/09/2014   Skin lesion of cheek 09/08/2013   Low back pain 08/21/2013   OSA (obstructive sleep apnea) 10/14/2012   Hyperlipidemia 09/07/2012   Chronic fatigue 08/16/2012   Nonspecific elevation of levels of transaminase or lactic acid dehydrogenase (LDH) 08/16/2012   Tobacco use 08/16/2012   NASH (nonalcoholic steatohepatitis) 03/21/2010   IBS (irritable bowel syndrome) 11/26/2009    PCP: Percell Belt, DO  REFERRING PROVIDER: Phylliss Bob, MD  REFERRING DIAG: Low back pain  Rationale for Evaluation and Treatment Rehabilitation  THERAPY DIAG:  Other low back pain  Muscle weakness (generalized)  Difficulty walking  ONSET DATE: 2018  SUBJECTIVE:                                                                                                                                                                                            SUBJECTIVE STATEMENT: Pt reports increased LBP (OA not SI). She states she has been sitting a prolonged period of time today working.  Eval:  Pt states the pain all started after an SIJ fusion in Feb 2023. She states she has had the hip and back pain for 6 years now and Dr. Lynann Bologna performed her R SIJ fusion. Pt has tried TPDN and chiro visits. Pt states that she is 70% better than she was after surgery but still is not very good. She has not been able to tolerate land therapy very well. The later in the day, the worse the pain has been. Pt states she is only able to work from bed. She is unable to do any ADL at home due to the pain and weakness. Pt does not have NT down the legs but only into the groin.   She has been doing PT at [facility] since surgery but she has not gotten much better due to her poor gait quality and muscle weakness that does not respond well to . She has sciatica type symptoms as well. Pt will sometimes use an AD for anything over half an hour long.   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 Pt is here primarily for aquatic therapy.   Pt was doing PT on land: straight leg bridge, SLR, LTR while at home. Pt tried some TPDN and deep tissue massaging but no stretching at all.    PERTINENT HISTORY:  SIJ  fusion Dec 23 2021, TIA   PAIN:  Are you having pain? Yes: NPRS scale: 4/10  Pain location:R SIJ, deep Pain description: achy Aggravating factors: stairs, sitting too long, standing too long, walking, chores, cooking, showering Relieving factors: heat, meds, ice,  leaning on a cart  PRECAUTIONS: None  WEIGHT BEARING RESTRICTIONS No  FALLS:  Has patient fallen in last 6 months? No, but pain was so bad caused her knees to buckle  LIVING ENVIRONMENT: Lives with: lives with their  family and lives with their spouse Lives in: House/apartment Stairs: yes Has following equipment at home: Single point cane and Environmental consultant - 4 wheeled  OCCUPATION: [employer]  PLOF: Independent with basic ADLs  PATIENT GOALS : be able to exercise again; pt is acutely aware of recent weight gain and loss of function   OBJECTIVE:   DIAGNOSTIC FINDINGS:  R hip MRI  IMPRESSION: 1. Postsurgical changes related to interval arthrodesis of the right sacroiliac joint. 2. No acute findings or explanation for the patient's symptoms. No significant hip arthropathy or evidence of labral tear.    Disc levels:   T12-L1: No significant disc protrusion, foraminal stenosis, or canal stenosis.   L1-L2: No significant disc protrusion, foraminal stenosis, or canal stenosis.   L2-L3: Slight retrolisthesis of L2 on L3. Small left foraminal disc protrusion without significant canal or foraminal stenosis. No significant change.   L3-L4: Mild disc height loss and desiccation. Mild disc bulging with mild left facet hypertrophy and ligamentum flavum thickening. No significant canal or foraminal stenosis. No significant change.   L4-L5: Mild disc bulging and mild bilateral facet hypertrophy and ligamentum flavum thickening. No significant canal or foraminal stenosis. No significant change.   L5-S1: Bilateral facet arthropathy without significant canal or foraminal stenosis.   IMPRESSION: Similar multilevel degenerative change (detailed above) without significant canal or foraminal stenosis.  PATIENT SURVEYS:  FOTO 37 46 @ DC     TODAY'S TREATMENT  10/3  Pt seen for aquatic therapy today.  Treatment took place in water 3.25-4.75 ft in depth at the [facility]. Temp of water was 92.  Pt entered/exited the pool via stairs (step to) independently with bilat rail.   Warm up: sidestepping, retro walking, fwd walking in 43f 8" water -L stretch 3x20-30s hold; 4th set with gentle R/L  hip hiking - holding wall:  Rt hip circles  CW/CCW -Squats, pushing rainbow hand buoy under water x 10; - at bench with blue step under feet; STS with eccentric lowering x 10 - Plank with small hip ext x 10; gentle fire hydrant x10 small range. -Deborah Chalkset with small blue squoodle 3 x 15 sec hold; squoodle pull down x 10 - Straddle sit cycling 20x fwd and retro; scissor kicking; gentle cc ski  -cycling on noodle   Pt requires buoyancy for support and to offload joints with strengthening exercises. Viscosity of the water is needed for resistance of strengthening; water current perturbations provides challenge to standing balance unsupported, requiring increased core activation.     PATIENT EDUCATION:  Education details:  aquatics progressions Person educated: Patient Education method: EEducation officer, environmental TCorporate treasurercues, Verbal cues,  Education comprehension: verbalized understanding, returned demonstration, verbal cues required, and tactile cues required   HOME EXERCISE PROGRAM: Access Code: [email or web address] URL: [email or web address] Date: 07/09/2022 Prepared by: ADaleen Bo ASSESSMENT:  CLINICAL IMPRESSION: Pt reports she has had a great response to aquatic therapy.  She does reports slight increase in LBP today but states it is her OA rather than SI.  In the past month decreasing overall pain by 3-4 points. Pt execute exercises with vc. Did not execute split squats as they increased pain last session. Goals ongoing.      OBJECTIVE IMPAIRMENTS Abnormal gait, decreased activity tolerance,  decreased balance, decreased endurance, decreased mobility, difficulty walking, decreased ROM, decreased strength, hypomobility, increased fascial restrictions, increased muscle spasms, impaired flexibility, improper body mechanics, postural dysfunction, and pain.   ACTIVITY LIMITATIONS carrying, lifting, bending, sitting, standing, squatting, stairs, transfers, and locomotion  level  PARTICIPATION LIMITATIONS: cleaning, laundry, interpersonal relationship, driving, shopping, community activity, occupation, and exercise  PERSONAL FACTORS Age, Behavior pattern, Fitness, Past/current experiences, Time since onset of injury/illness/exacerbation, and 1-2 comorbidities:  are also affecting patient's functional outcome.   REHAB POTENTIAL: Fair   CLINICAL DECISION MAKING: Stable/uncomplicated  EVALUATION COMPLEXITY: Low   GOALS:   SHORT TERM GOALS: Target date: 08/20/2022   Pt will become independent with HEP in order to demonstrate synthesis of PT education.   Goal status: INITIAL  2.  Pt will report at least 2 pt reduction on NPRS scale for pain in order to demonstrate functional improvement with household activity, self care, and ADL.   Goal status: INITIAL  3.  Pt will be able to demonstrate ability to ascend and descend stairs with step to pattern without pain in order to demonstrate functional improvement in lumbar function for self-care and house hold duties.   Goal status: INITIAL   LONG TERM GOALS: Target date: 10/01/2022   Pt  will become independent with final HEP in order to demonstrate synthesis of PT education.   Goal status: INITIAL  2.  Pt will score >/= 46 on FOTO to demonstrate improvement in perceived lumbar function.   Goal status: INITIAL  3.  Pt will be able to demonstrate/report ability to walk >15 mins without pain in order to demonstrate functional improvement and tolerance to exercise and community mobility.   Goal status: INITIAL  4.  Pt will be able to demonstrate/report ability to sit/stand/sleep for extended periods of time without pain in order to demonstrate functional improvement and tolerance to static positioning.  Goal status: INITIAL  5. Pt will be able to perform 5XSTS in under 12s with or without UE  in order to demonstrate functional improvement above the cut off score for adults.   Goal status:  INITIAL     PLAN: PT FREQUENCY: 1-2x/week  PT DURATION: 12 weeks  PLANNED INTERVENTIONS: Therapeutic exercises, Therapeutic activity, Neuromuscular re-education, Balance training, Gait training, Patient/Family education, Self Care, Joint mobilization, Joint manipulation, Stair training, Orthotic/Fit training, DME instructions, Aquatic Therapy, Dry Needling, Electrical stimulation, Spinal manipulation, Spinal mobilization, Cryotherapy, Moist heat, scar mobilization, Splintting, Taping, Vasopneumatic device, Traction, Ultrasound, Ionotophoresis 74m/ml Dexamethasone, Manual therapy, and Re-evaluation  PLAN FOR NEXT SESSION: repeat aquatic; R anterior hip/thigh stretching, normalizing gait pattern, lumbopelvic strength and motor control   MAnnamarie Major Diya Gervasi MPT 08/11/22 4:32 PM [facility] [address]GC[address] [place] [address]Phone: [phone]  Fax:  [phone]

## 2022-08-13 ENCOUNTER — Ambulatory Visit (HOSPITAL_BASED_OUTPATIENT_CLINIC_OR_DEPARTMENT_OTHER): Payer: BC Managed Care – PPO | Admitting: Physical Therapy

## 2022-08-18 ENCOUNTER — Encounter (HOSPITAL_BASED_OUTPATIENT_CLINIC_OR_DEPARTMENT_OTHER): Payer: Self-pay | Admitting: Physical Therapy

## 2022-08-18 ENCOUNTER — Ambulatory Visit (HOSPITAL_BASED_OUTPATIENT_CLINIC_OR_DEPARTMENT_OTHER): Payer: BC Managed Care – PPO | Admitting: Physical Therapy

## 2022-08-18 DIAGNOSIS — M6281 Muscle weakness (generalized): Secondary | ICD-10-CM

## 2022-08-18 DIAGNOSIS — R262 Difficulty in walking, not elsewhere classified: Secondary | ICD-10-CM

## 2022-08-18 DIAGNOSIS — M5459 Other low back pain: Secondary | ICD-10-CM

## 2022-08-18 NOTE — Therapy (Signed)
OUTPATIENT PHYSICAL THERAPY THORACOLUMBAR    Patient Name: [name] MRN: [id number] DOB:10/22/1961, 61 y.o., female Today's Date: 08/18/2022   PT End of Session - 08/18/22 1538     Visit Number 8    Number of Visits 21    Date for PT Re-Evaluation 10/07/22    Authorization Type BCBS    PT Start Time 1531    PT Stop Time 1615    PT Time Calculation (min) 44 min    Activity Tolerance Patient tolerated treatment well    Behavior During Therapy WFL for tasks assessed/performed                 Past Medical History:  Diagnosis Date   BPPV (benign paroxysmal positional vertigo)    [facility] rehab ref-->07/15/18   Chronic headaches    Chronic low back pain    multilevel facet arthropathy   Chronic renal insufficiency, stage 2 (mild)    borderlined II/III (GF@60 )   Depression    Diabetes mellitus ([place]) 10/2019 prediab; 04/2020 DM 2   Fasting gluc low 120s, Hba1c 6.3%. June 2021 A1c 7%   History of adenomatous polyp of colon 03/09/2014   tubular adenoma 2011    Hyperlipidemia 10/2019   10 yr [place] CV risk= 3.5%->TLC.  LDL 170s + new dx DM 04/2020->pt declines any statin b/c of myalgis on 2 diff ones.   Migraine    NASH (nonalcoholic steatohepatitis) 03/21/2010   Qualifier: Diagnosis of  By: [name] MD, [name]    OSA (obstructive sleep apnea) 10/14/2012   Sleep study completed on 09/22/2012-interpreting physician Clinton D. Young MD  Impression: 1. 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 2. Moderate scarring with oxygen saturations were -87% with mean saturation to the study night at 92% on room air. 3. Regular cardiac rhythm and the heart rate is 60 beats per minute.    RMSF [facility] spotted fever) 2017/18   Lyme neg   Sacroiliac pain    chronic, left (Dr. Othelia Pulling eval 04/2021->SI injection helpful.   Tobacco dependence    switched to vap 2017   Past Surgical History:  Procedure Laterality Date   ABDOMINAL HYSTERECTOMY  2010   [name]   CAROTID DOPPLERS Bilateral 07/24/2020   NORMAL   cataract surg     2021   CHOLECYSTECTOMY N/A 03/10/2014   Procedure: LAPAROSCOPIC CHOLECYSTECTOMY WITH INTRAOPERATIVE CHOLANGIOGRAM;  Surgeon: [name], MD;  Location: [facility];  Service: General;  Laterality: N/A;   COLONOSCOPY W/ BIOPSIES  2011; 03/2014   Dr [name] 2011.  [name] 2015 (polyps-->recall 5 yrs).   ESOPHAGOGASTRODUODENOSCOPY  2011   Dr [name]   KNEE ARTHROSCOPY  2015   LAPAROSCOPIC APPENDECTOMY  2009   [name]   RHYTHM MONITORING  07/2020   normal   sij fusion Right 12/23/2021   TEMPOROMANDIBULAR JOINT ARTHROPLASTY     TEMPOROMANDIBULAR JOINT SURGERY     TONSILLECTOMY     TRANSTHORACIC ECHOCARDIOGRAM  08/2020   COMPLETELY NORMAL   Patient Active Problem List   Diagnosis Date Noted   Sun-damaged skin 08/04/2017   Peripheral tear of medial meniscus of left knee as current injury 01/04/2017   Common migraine with intractable migraine 05/11/2016   Non-intractable cyclical vomiting without nausea 08/28/2015   Carbuncle, thigh 12/19/2014   Personal history of colonic polyps 03/09/2014   Nausea and vomiting 03/09/2014   Abdominal distension - diffuse 03/09/2014   Obesity (BMI 30-39.9) 03/09/2014  Tobacco abuse 03/09/2014   Skin lesion of cheek 09/08/2013   Low back pain 08/21/2013   OSA (obstructive sleep apnea) 10/14/2012   Hyperlipidemia 09/07/2012   Chronic fatigue 08/16/2012   Nonspecific elevation of levels of transaminase or lactic acid dehydrogenase (LDH) 08/16/2012   Tobacco use 08/16/2012   NASH (nonalcoholic steatohepatitis) 03/21/2010   IBS (irritable bowel syndrome) 11/26/2009    PCP: Percell Belt, DO  REFERRING PROVIDER: Phylliss Bob, MD  REFERRING DIAG: Low back pain  Rationale for Evaluation and Treatment Rehabilitation  THERAPY DIAG:  Other low back pain  Muscle weakness (generalized)  Difficulty walking  ONSET DATE: 2018  SUBJECTIVE:                                                                                                                                                                                            SUBJECTIVE STATEMENT: "I am great.  Pain in pool right now 0/10 prior to getting in the pool 2/10.  Maybe the cold weather"  Eval:  Pt states the pain all started after an SIJ fusion in Feb 2023. She states she has had the hip and back pain for 6 years now and Dr. Lynann Bologna performed her R SIJ fusion. Pt has tried TPDN and chiro visits. Pt states that she is 70% better than she was after surgery but still is not very good. She has not been able to tolerate land therapy very well. The later in the day, the worse the pain has been. Pt states she is only able to work from bed. She is unable to do any ADL at home due to the pain and weakness. Pt does not have NT down the legs but only into the groin.   She has been doing PT at [facility] since surgery but she has not gotten much better due to her poor gait quality and muscle weakness that does not respond well to . She has sciatica type symptoms as well. Pt will sometimes use an AD for anything over half an hour long.   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 Pt is here primarily for aquatic therapy.   Pt was doing PT on land: straight leg bridge, SLR, LTR while at home. Pt tried some TPDN and deep tissue massaging but no stretching at all.    PERTINENT HISTORY:  SIJ  fusion Dec 23 2021, TIA   PAIN:  Are you having pain? Yes: NPRS scale: 0-2/10  Pain location:R SIJ, deep Pain description: achy Aggravating factors: stairs, sitting too long, standing too long, walking, chores, cooking, showering Relieving factors:  heat, meds, ice, leaning on a cart  PRECAUTIONS: None  WEIGHT BEARING RESTRICTIONS No  FALLS:  Has patient fallen in last 6 months? No, but pain was so bad caused her knees to buckle  LIVING ENVIRONMENT: Lives with: lives with their  family and lives with their spouse Lives in: House/apartment Stairs: yes Has following equipment at home: Single point cane and Environmental consultant - 4 wheeled  OCCUPATION: [employer]  PLOF: Independent with basic ADLs  PATIENT GOALS : be able to exercise again; pt is acutely aware of recent weight gain and loss of function   OBJECTIVE:   DIAGNOSTIC FINDINGS:  R hip MRI  IMPRESSION: 1. Postsurgical changes related to interval arthrodesis of the right sacroiliac joint. 2. No acute findings or explanation for the patient's symptoms. No significant hip arthropathy or evidence of labral tear.    Disc levels:   T12-L1: No significant disc protrusion, foraminal stenosis, or canal stenosis.   L1-L2: No significant disc protrusion, foraminal stenosis, or canal stenosis.   L2-L3: Slight retrolisthesis of L2 on L3. Small left foraminal disc protrusion without significant canal or foraminal stenosis. No significant change.   L3-L4: Mild disc height loss and desiccation. Mild disc bulging with mild left facet hypertrophy and ligamentum flavum thickening. No significant canal or foraminal stenosis. No significant change.   L4-L5: Mild disc bulging and mild bilateral facet hypertrophy and ligamentum flavum thickening. No significant canal or foraminal stenosis. No significant change.   L5-S1: Bilateral facet arthropathy without significant canal or foraminal stenosis.   IMPRESSION: Similar multilevel degenerative change (detailed above) without significant canal or foraminal stenosis.  PATIENT SURVEYS:  FOTO 9 46 @ DC     TODAY'S TREATMENT  10/10  Pt seen for aquatic therapy today.  Treatment took place in water 3.25-4.75 ft in depth at the [place]. Temp of water was 92.  Pt entered/exited the pool via stairs (step to) independently with bilat rail.   Warm up: sidestepping, retro walking, fwd walking in 26f 8" water -L stretch on wall 3x20-30s hold; 4th set with  gentle R/L hip hiking - holding wall:  Rt hip and left circles  CW/CCW - TransAb set with small blue squoodle 3 x 15 sec hold; squoodle pull down x 10 -modified bridges with glut squeezes 2x5 on steps -hip flex stretch at wall R/L -lumbar rotation -Plank with small hip ext x 10; gentle fire hydrant x10 small range. pushing rainbow hand buoy under water x 10; -pelvic tilts, hiking and rotation sitting on noodle -lb and pelvic distraction using yellow noodle 4.648fend of session.  Pt requires buoyancy for support and to offload joints with strengthening exercises. Viscosity of the water is needed for resistance of strengthening; water current perturbations provides challenge to standing balance unsupported, requiring increased core activation.     PATIENT EDUCATION:  Education details:  aquatics progressions Person educated: Patient Education method: ExEducation officer, environmentalTaCorporate treasurerues, Verbal cues,  Education comprehension: verbalized understanding, returned demonstration, verbal cues required, and tactile cues required   HOME EXERCISE PROGRAM: Access Code: [email or web address] URL: [email or web address] Date: 07/09/2022 Prepared by: AlDaleen BoASSESSMENT:  CLINICAL IMPRESSION: Pt with increase SI discomfort with added paraspinal spasming when focusing on glute strengthening at beginning of session.  She does have improvement with hip er and extension without pain in plank today.  Added pelvic movement which spiked pt pain to 4/10.  She completes entire session with a few recovery periods. She reports compliance with HEP. Overall pain has decreased  but she continues to flair to 4/10 which was initial evaluation level pain. Pt climbing stair with reciprocal pattern although with pain. She is scheduled for an nerve ablation in 2 weeks. Goals ongoing.   OBJECTIVE IMPAIRMENTS Abnormal gait, decreased activity tolerance, decreased balance, decreased endurance, decreased mobility,  difficulty walking, decreased ROM, decreased strength, hypomobility, increased fascial restrictions, increased muscle spasms, impaired flexibility, improper body mechanics, postural dysfunction, and pain.   ACTIVITY LIMITATIONS carrying, lifting, bending, sitting, standing, squatting, stairs, transfers, and locomotion level  PARTICIPATION LIMITATIONS: cleaning, laundry, interpersonal relationship, driving, shopping, community activity, occupation, and exercise  PERSONAL FACTORS Age, Behavior pattern, Fitness, Past/current experiences, Time since onset of injury/illness/exacerbation, and 1-2 comorbidities:  are also affecting patient's functional outcome.   REHAB POTENTIAL: Fair   CLINICAL DECISION MAKING: Stable/uncomplicated  EVALUATION COMPLEXITY: Low   GOALS:   SHORT TERM GOALS: Target date: 08/20/2022   Pt will become independent with HEP in order to demonstrate synthesis of PT education.   Goal status: Achieved.   2.  Pt will report at least 2 pt reduction on NPRS scale for pain in order to demonstrate functional improvement with household activity, self care, and ADL.   Goal status: ongoing  3.  Pt will be able to demonstrate ability to ascend and descend stairs with step to pattern without pain in order to demonstrate functional improvement in lumbar function for self-care and house hold duties.   Goal status: ongoing   LONG TERM GOALS: Target date: 10/01/2022   Pt  will become independent with final HEP in order to demonstrate synthesis of PT education.   Goal status: INITIAL  2.  Pt will score >/= 46 on FOTO to demonstrate improvement in perceived lumbar function.   Goal status: INITIAL  3.  Pt will be able to demonstrate/report ability to walk >15 mins without pain in order to demonstrate functional improvement and tolerance to exercise and community mobility.   Goal status: INITIAL  4.  Pt will be able to demonstrate/report ability to sit/stand/sleep for  extended periods of time without pain in order to demonstrate functional improvement and tolerance to static positioning.  Goal status: INITIAL  5. Pt will be able to perform 5XSTS in under 12s with or without UE  in order to demonstrate functional improvement above the cut off score for adults.   Goal status: INITIAL     PLAN: PT FREQUENCY: 1-2x/week  PT DURATION: 12 weeks  PLANNED INTERVENTIONS: Therapeutic exercises, Therapeutic activity, Neuromuscular re-education, Balance training, Gait training, Patient/Family education, Self Care, Joint mobilization, Joint manipulation, Stair training, Orthotic/Fit training, DME instructions, Aquatic Therapy, Dry Needling, Electrical stimulation, Spinal manipulation, Spinal mobilization, Cryotherapy, Moist heat, scar mobilization, Splintting, Taping, Vasopneumatic device, Traction, Ultrasound, Ionotophoresis 34m/ml Dexamethasone, Manual therapy, and Re-evaluation  PLAN FOR NEXT SESSION: repeat aquatic; R anterior hip/thigh stretching, normalizing gait pattern, lumbopelvic strength and motor control   MAnnamarie Major Coby Antrobus MPT 08/18/22 3:41 PM [facility] [address] [address]Phone: [phone]  Fax:  [phone]

## 2022-08-20 ENCOUNTER — Ambulatory Visit (HOSPITAL_BASED_OUTPATIENT_CLINIC_OR_DEPARTMENT_OTHER): Payer: BC Managed Care – PPO | Admitting: Physical Therapy

## 2022-08-24 NOTE — Therapy (Signed)
OUTPATIENT PHYSICAL THERAPY THORACOLUMBAR    Patient Name: [name] MRN: [id number] DOB:1961-05-18, 61 y.o., female Today's Date: 08/25/2022   PT End of Session - 08/25/22 1536     Visit Number 9    Number of Visits 21    Date for PT Re-Evaluation 10/07/22    Authorization Type BCBS    PT Start Time 1531    PT Stop Time 1615    PT Time Calculation (min) 44 min    Activity Tolerance Patient tolerated treatment well    Behavior During Therapy WFL for tasks assessed/performed                  Past Medical History:  Diagnosis Date   BPPV (benign paroxysmal positional vertigo)    Vestib rehab ref-->07/15/18   Chronic headaches    Chronic low back pain    multilevel facet arthropathy   Chronic renal insufficiency, stage 2 (mild)    borderlined II/III (GF@60 )   Depression    Diabetes mellitus ([place]) 10/2019 prediab; 04/2020 DM 2   Fasting gluc low 120s, Hba1c 6.3%. June 2021 A1c 7%   History of adenomatous polyp of colon 03/09/2014   tubular adenoma 2011    Hyperlipidemia 10/2019   10 yr [place] CV risk= 3.5%->TLC.  LDL 170s + new dx DM 04/2020->pt declines any statin b/c of myalgis on 2 diff ones.   Migraine    NASH (nonalcoholic steatohepatitis) 03/21/2010   Qualifier: Diagnosis of  By: [name] MD, [name]    OSA (obstructive sleep apnea) 10/14/2012   Sleep study completed on 09/22/2012-interpreting physician Clinton D. Young MD  Impression: 1. 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 2. Moderate scarring with oxygen saturations were -87% with mean saturation to the study night at 92% on room air. 3. Regular cardiac rhythm and the heart rate is 60 beats per minute.    RMSF [facility] spotted fever) 2017/18   Lyme neg   Sacroiliac pain    chronic, left (Dr. Othelia Pulling eval 04/2021->SI injection helpful.   Tobacco dependence    switched to vap 2017   Past Surgical History:  Procedure Laterality Date   ABDOMINAL HYSTERECTOMY  2010   [name]   CAROTID DOPPLERS Bilateral 07/24/2020   NORMAL   cataract surg     2021   CHOLECYSTECTOMY N/A 03/10/2014   Procedure: LAPAROSCOPIC CHOLECYSTECTOMY WITH INTRAOPERATIVE CHOLANGIOGRAM;  Surgeon: [name], MD;  Location: [facility];  Service: General;  Laterality: N/A;   COLONOSCOPY W/ BIOPSIES  2011; 03/2014   Dr [name] 2011.  [name] 2015 (polyps-->recall 5 yrs).   ESOPHAGOGASTRODUODENOSCOPY  2011   Dr [name]   KNEE ARTHROSCOPY  2015   LAPAROSCOPIC APPENDECTOMY  2009   [name]   RHYTHM MONITORING  07/2020   normal   sij fusion Right 12/23/2021   TEMPOROMANDIBULAR JOINT ARTHROPLASTY     TEMPOROMANDIBULAR JOINT SURGERY     TONSILLECTOMY     TRANSTHORACIC ECHOCARDIOGRAM  08/2020   COMPLETELY NORMAL   Patient Active Problem List   Diagnosis Date Noted   Sun-damaged skin 08/04/2017   Peripheral tear of medial meniscus of left knee as current injury 01/04/2017   Common migraine with intractable migraine 05/11/2016   Non-intractable cyclical vomiting without nausea 08/28/2015   Carbuncle, thigh 12/19/2014   Personal history of colonic polyps 03/09/2014   Nausea and vomiting 03/09/2014   Abdominal distension - diffuse 03/09/2014   Obesity (BMI 30-39.9) 03/09/2014  Tobacco abuse 03/09/2014   Skin lesion of cheek 09/08/2013   Low back pain 08/21/2013   OSA (obstructive sleep apnea) 10/14/2012   Hyperlipidemia 09/07/2012   Chronic fatigue 08/16/2012   Nonspecific elevation of levels of transaminase or lactic acid dehydrogenase (LDH) 08/16/2012   Tobacco use 08/16/2012   NASH (nonalcoholic steatohepatitis) 03/21/2010   IBS (irritable bowel syndrome) 11/26/2009    PCP: Percell Belt, DO  REFERRING PROVIDER: Phylliss Bob, MD  REFERRING DIAG: Low back pain  Rationale for Evaluation and Treatment Rehabilitation  THERAPY DIAG:  Other low back pain  Muscle weakness (generalized)  Difficulty walking  ONSET DATE: 2018  SUBJECTIVE:                                                                                                                                                                                            SUBJECTIVE STATEMENT: "I am hurting bad and I have been for days"  Eval:  Pt states the pain all started after an SIJ fusion in Feb 2023. She states she has had the hip and back pain for 6 years now and Dr. Lynann Bologna performed her R SIJ fusion. Pt has tried TPDN and chiro visits. Pt states that she is 70% better than she was after surgery but still is not very good. She has not been able to tolerate land therapy very well. The later in the day, the worse the pain has been. Pt states she is only able to work from bed. She is unable to do any ADL at home due to the pain and weakness. Pt does not have NT down the legs but only into the groin.   She has been doing PT at [facility] since surgery but she has not gotten much better due to her poor gait quality and muscle weakness that does not respond well to . She has sciatica type symptoms as well. Pt will sometimes use an AD for anything over half an hour long.   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 Pt is here primarily for aquatic therapy.   Pt was doing PT on land: straight leg bridge, SLR, LTR while at home. Pt tried some TPDN and deep tissue massaging but no stretching at all.    PERTINENT HISTORY:  SIJ  fusion Dec 23 2021, TIA   PAIN:  Are you having pain? Yes: NPRS scale: 8/10  Pain location:R SIJ, deep Pain description: achy Aggravating factors: stairs, sitting too long, standing too long, walking, chores, cooking, showering Relieving factors: heat, meds, ice, leaning on a cart  PRECAUTIONS: None  WEIGHT  BEARING RESTRICTIONS No  FALLS:  Has patient fallen in last 6 months? No, but pain was so bad caused her knees to buckle  LIVING ENVIRONMENT: Lives with: lives with their family and lives with their spouse Lives in:  House/apartment Stairs: yes Has following equipment at home: Single point cane and Environmental consultant - 4 wheeled  OCCUPATION: [employer]  PLOF: Independent with basic ADLs  PATIENT GOALS : be able to exercise again; pt is acutely aware of recent weight gain and loss of function   OBJECTIVE:   DIAGNOSTIC FINDINGS:  R hip MRI  IMPRESSION: 1. Postsurgical changes related to interval arthrodesis of the right sacroiliac joint. 2. No acute findings or explanation for the patient's symptoms. No significant hip arthropathy or evidence of labral tear.    Disc levels:   T12-L1: No significant disc protrusion, foraminal stenosis, or canal stenosis.   L1-L2: No significant disc protrusion, foraminal stenosis, or canal stenosis.   L2-L3: Slight retrolisthesis of L2 on L3. Small left foraminal disc protrusion without significant canal or foraminal stenosis. No significant change.   L3-L4: Mild disc height loss and desiccation. Mild disc bulging with mild left facet hypertrophy and ligamentum flavum thickening. No significant canal or foraminal stenosis. No significant change.   L4-L5: Mild disc bulging and mild bilateral facet hypertrophy and ligamentum flavum thickening. No significant canal or foraminal stenosis. No significant change.   L5-S1: Bilateral facet arthropathy without significant canal or foraminal stenosis.   IMPRESSION: Similar multilevel degenerative change (detailed above) without significant canal or foraminal stenosis.  PATIENT SURVEYS:  FOTO 37 46 @ DC     TODAY'S TREATMENT  10/17  Pt seen for aquatic therapy today.  Treatment took place in water 3.25-4.75 ft in depth at the [facility]. Temp of water was 92.  Pt entered/exited the pool via stairs (step to) independently with bilat rail.   Warm up: sidestepping, retro walking, fwd walking in 79f 8" water -L stretch on wall 3x20-30s hold; 4th set with gentle R/L hip hiking -hip flex stretch at wall  R/L -lumbar rotation -Plank with small hip ext x 10; gentle fire hydrant x10 small range. -lb and pelvic distraction using yellow noodle 4.6104fadd/abd, hip openers  Pt requires buoyancy for support and to offload joints with strengthening exercises. Viscosity of the water is needed for resistance of strengthening; water current perturbations provides challenge to standing balance unsupported, requiring increased core activation.     PATIENT EDUCATION:  Education details:  aquatics progressions Person educated: Patient Education method: ExEducation officer, environmentalTaCorporate treasurerues, Verbal cues,  Education comprehension: verbalized understanding, returned demonstration, verbal cues required, and tactile cues required   HOME EXERCISE PROGRAM: Access Code: [email or web address] URL: [email or web address] Date: 07/09/2022 Prepared by: AlDaleen BoASSESSMENT:  CLINICAL IMPRESSION: Pt edu on pain scale; effect of pain on BP/HR and improved pain control techniques. Difference between acetaminophen and ibuprofen. Pt has high pain throughout weekend. 8/10.  Reports staying in bed all Sunday.  Has ablation end of week.  She has poor tolerance to strengthening exercises so focused on stretching and easy movement walking, straddling noodle and using noodle under arms for lumbar distraction. Pain reduction to 4/10 by end of session.     OBJECTIVE IMPAIRMENTS Abnormal gait, decreased activity tolerance, decreased balance, decreased endurance, decreased mobility, difficulty walking, decreased ROM, decreased strength, hypomobility, increased fascial restrictions, increased muscle spasms, impaired flexibility, improper body mechanics, postural dysfunction, and pain.   ACTIVITY LIMITATIONS carrying, lifting, bending, sitting, standing, squatting, stairs, transfers, and  locomotion level  PARTICIPATION LIMITATIONS: cleaning, laundry, interpersonal relationship, driving, shopping, community activity,  occupation, and exercise  PERSONAL FACTORS Age, Behavior pattern, Fitness, Past/current experiences, Time since onset of injury/illness/exacerbation, and 1-2 comorbidities:  are also affecting patient's functional outcome.   REHAB POTENTIAL: Fair   CLINICAL DECISION MAKING: Stable/uncomplicated  EVALUATION COMPLEXITY: Low    GOALS:   SHORT TERM GOALS: Target date: 08/20/2022   Pt will become independent with HEP in order to demonstrate synthesis of PT education.   Goal status: Achieved.   2.  Pt will report at least 2 pt reduction on NPRS scale for pain in order to demonstrate functional improvement with household activity, self care, and ADL.   Goal status: ongoing  3.  Pt will be able to demonstrate ability to ascend and descend stairs with step to pattern without pain in order to demonstrate functional improvement in lumbar function for self-care and house hold duties.   Goal status: ongoing   LONG TERM GOALS: Target date: 10/01/2022   Pt  will become independent with final HEP in order to demonstrate synthesis of PT education.   Goal status: INITIAL  2.  Pt will score >/= 46 on FOTO to demonstrate improvement in perceived lumbar function.   Goal status: INITIAL  3.  Pt will be able to demonstrate/report ability to walk >15 mins without pain in order to demonstrate functional improvement and tolerance to exercise and community mobility.   Goal status: INITIAL  4.  Pt will be able to demonstrate/report ability to sit/stand/sleep for extended periods of time without pain in order to demonstrate functional improvement and tolerance to static positioning.  Goal status: INITIAL  5. Pt will be able to perform 5XSTS in under 12s with or without UE  in order to demonstrate functional improvement above the cut off score for adults.   Goal status: INITIAL     PLAN: PT FREQUENCY: 1-2x/week  PT DURATION: 12 weeks  PLANNED INTERVENTIONS: Therapeutic exercises,  Therapeutic activity, Neuromuscular re-education, Balance training, Gait training, Patient/Family education, Self Care, Joint mobilization, Joint manipulation, Stair training, Orthotic/Fit training, DME instructions, Aquatic Therapy, Dry Needling, Electrical stimulation, Spinal manipulation, Spinal mobilization, Cryotherapy, Moist heat, scar mobilization, Splintting, Taping, Vasopneumatic device, Traction, Ultrasound, Ionotophoresis 35m/ml Dexamethasone, Manual therapy, and Re-evaluation  PLAN FOR NEXT SESSION: repeat aquatic; R anterior hip/thigh stretching, normalizing gait pattern, lumbopelvic strength and motor control   MAnnamarie Major Felita Bump MPT 08/25/22 3:41 PM [facility] [address]GSuffern NAlaska [address]Phone: [phone]  Fax:  [phone]

## 2022-08-25 ENCOUNTER — Encounter (HOSPITAL_BASED_OUTPATIENT_CLINIC_OR_DEPARTMENT_OTHER): Payer: Self-pay | Admitting: Physical Therapy

## 2022-08-25 ENCOUNTER — Ambulatory Visit (HOSPITAL_BASED_OUTPATIENT_CLINIC_OR_DEPARTMENT_OTHER): Payer: BC Managed Care – PPO | Admitting: Physical Therapy

## 2022-08-25 DIAGNOSIS — M5459 Other low back pain: Secondary | ICD-10-CM

## 2022-08-25 DIAGNOSIS — M6281 Muscle weakness (generalized): Secondary | ICD-10-CM

## 2022-08-25 DIAGNOSIS — R262 Difficulty in walking, not elsewhere classified: Secondary | ICD-10-CM

## 2022-08-27 ENCOUNTER — Ambulatory Visit (HOSPITAL_BASED_OUTPATIENT_CLINIC_OR_DEPARTMENT_OTHER): Payer: BC Managed Care – PPO | Admitting: Physical Therapy

## 2022-09-02 ENCOUNTER — Ambulatory Visit (HOSPITAL_BASED_OUTPATIENT_CLINIC_OR_DEPARTMENT_OTHER): Payer: BC Managed Care – PPO | Admitting: Physical Therapy

## 2022-09-02 ENCOUNTER — Encounter (HOSPITAL_BASED_OUTPATIENT_CLINIC_OR_DEPARTMENT_OTHER): Payer: Self-pay | Admitting: Physical Therapy

## 2022-09-02 DIAGNOSIS — M6281 Muscle weakness (generalized): Secondary | ICD-10-CM

## 2022-09-02 DIAGNOSIS — R262 Difficulty in walking, not elsewhere classified: Secondary | ICD-10-CM

## 2022-09-02 DIAGNOSIS — M5459 Other low back pain: Secondary | ICD-10-CM

## 2022-09-02 NOTE — Therapy (Signed)
OUTPATIENT PHYSICAL THERAPY THORACOLUMBAR TREATMENT   Patient Name: [name] MRN: [id number] DOB:25-Jul-1961, 61 y.o., female Today's Date: 09/02/2022   PT End of Session - 09/02/22 1413     Visit Number 10    Number of Visits 21    Date for PT Re-Evaluation 10/07/22    Authorization Type BCBS    PT Start Time [phone]    PT Stop Time 1030    PT Time Calculation (min) 41 min    Behavior During Therapy WFL for tasks assessed/performed                   Past Medical History:  Diagnosis Date   BPPV (benign paroxysmal positional vertigo)    Vestib rehab ref-->07/15/18   Chronic headaches    Chronic low back pain    multilevel facet arthropathy   Chronic renal insufficiency, stage 2 (mild)    borderlined II/III (GF@60 )   Depression    Diabetes mellitus ([place]) 10/2019 prediab; 04/2020 DM 2   Fasting gluc low 120s, Hba1c 6.3%. June 2021 A1c 7%   History of adenomatous polyp of colon 03/09/2014   tubular adenoma 2011    Hyperlipidemia 10/2019   10 yr [place] CV risk= 3.5%->TLC.  LDL 170s + new dx DM 04/2020->pt declines any statin b/c of myalgis on 2 diff ones.   Migraine    NASH (nonalcoholic steatohepatitis) 03/21/2010   Qualifier: Diagnosis of  By: [name] MD, [name]    OSA (obstructive sleep apnea) 10/14/2012   Sleep study completed on 09/22/2012-interpreting physician Clinton D. Young MD  Impression: 1. 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 2. Moderate scarring with oxygen saturations were -87% with mean saturation to the study night at 92% on room air. 3. Regular cardiac rhythm and the heart rate is 60 beats per minute.    RMS[facility] spotted fever) 2017/18   Lyme neg   Sacroiliac pain    chronic, left (Dr. Othelia Pulling eval 04/2021->SI injection helpful.   Tobacco dependence    switched to vap 2017   Past Surgical History:  Procedure Laterality Date   ABDOMINAL HYSTERECTOMY  2010   [name]   CAROTID DOPPLERS Bilateral  07/24/2020   NORMAL   cataract surg     2021   CHOLECYSTECTOMY N/A 03/10/2014   Procedure: LAPAROSCOPIC CHOLECYSTECTOMY WITH INTRAOPERATIVE CHOLANGIOGRAM;  Surgeon: [name], MD;  Location: [facility];  Service: General;  Laterality: N/A;   COLONOSCOPY W/ BIOPSIES  2011; 03/2014   Dr [name] 2011.  [name] 2015 (polyps-->recall 5 yrs).   ESOPHAGOGASTRODUODENOSCOPY  2011   Dr [name]   KNEE ARTHROSCOPY  2015   LAPAROSCOPIC APPENDECTOMY  2009   [name]   RHYTHM MONITORING  07/2020   normal   sij fusion Right 12/23/2021   TEMPOROMANDIBULAR JOINT ARTHROPLASTY     TEMPOROMANDIBULAR JOINT SURGERY     TONSILLECTOMY     TRANSTHORACIC ECHOCARDIOGRAM  08/2020   COMPLETELY NORMAL   Patient Active Problem List   Diagnosis Date Noted   Sun-damaged skin 08/04/2017   Peripheral tear of medial meniscus of left knee as current injury 01/04/2017   Common migraine with intractable migraine 05/11/2016   Non-intractable cyclical vomiting without nausea 08/28/2015   Carbuncle, thigh 12/19/2014   Personal history of colonic polyps 03/09/2014   Nausea and vomiting 03/09/2014   Abdominal distension - diffuse 03/09/2014   Obesity (BMI 30-39.9) 03/09/2014   Tobacco abuse 03/09/2014   Skin lesion  of cheek 09/08/2013   Low back pain 08/21/2013   OSA (obstructive sleep apnea) 10/14/2012   Hyperlipidemia 09/07/2012   Chronic fatigue 08/16/2012   Nonspecific elevation of levels of transaminase or lactic acid dehydrogenase (LDH) 08/16/2012   Tobacco use 08/16/2012   NASH (nonalcoholic steatohepatitis) 03/21/2010   IBS (irritable bowel syndrome) 11/26/2009    PCP: Percell Belt, DO  REFERRING PROVIDER: Phylliss Bob, MD  REFERRING DIAG: Low back pain  Rationale for Evaluation and Treatment Rehabilitation  THERAPY DIAG:  Other low back pain  Muscle weakness (generalized)  Difficulty walking  ONSET DATE: 2018  SUBJECTIVE:                                                                                                                                                                                            SUBJECTIVE STATEMENT: Pt reports she had ablation last Thurs (6 days ago); hasn't noticed a difference.  She reports she has had a lot of relief with aquatic therapy.     PERTINENT HISTORY:  SIJ  fusion Dec 23 2021, TIA   PAIN:  Are you having pain? Yes: NPRS scale: 3/10  Pain location:R SIJ, deep Pain description: achy Aggravating factors: stairs, sitting too long, standing too long, walking, chores, cooking, showering Relieving factors: heat, meds, ice, leaning on a cart  PRECAUTIONS: None  WEIGHT BEARING RESTRICTIONS No  FALLS:  Has patient fallen in last 6 months? No, but pain was so bad caused her knees to buckle  LIVING ENVIRONMENT: Lives with: lives with their family and lives with their spouse Lives in: House/apartment Stairs: yes Has following equipment at home: Single point cane and Environmental consultant - 4 wheeled  OCCUPATION: [employer]  PLOF: Independent with basic ADLs  PATIENT GOALS : be able to exercise again; pt is acutely aware of recent weight gain and loss of function   OBJECTIVE:   DIAGNOSTIC FINDINGS:  R hip MRI  IMPRESSION: 1. Postsurgical changes related to interval arthrodesis of the right sacroiliac joint. 2. No acute findings or explanation for the patient's symptoms. No significant hip arthropathy or evidence of labral tear.    Disc levels:   T12-L1: No significant disc protrusion, foraminal stenosis, or canal stenosis.   L1-L2: No significant disc protrusion, foraminal stenosis, or canal stenosis.   L2-L3: Slight retrolisthesis of L2 on L3. Small left foraminal disc protrusion without significant canal or foraminal stenosis. No significant change.   L3-L4: Mild disc height loss and desiccation. Mild disc bulging with mild left facet hypertrophy and ligamentum flavum thickening. No significant canal or foraminal stenosis. No  significant change.   L4-L5: Mild disc  bulging and mild bilateral facet hypertrophy and ligamentum flavum thickening. No significant canal or foraminal stenosis. No significant change.   L5-S1: Bilateral facet arthropathy without significant canal or foraminal stenosis.   IMPRESSION: Similar multilevel degenerative change (detailed above) without significant canal or foraminal stenosis.  PATIENT SURVEYS:  FOTO 37 46 @ DC     TODAY'S TREATMENT  10/25  Pt seen for aquatic therapy today.  Treatment took place in water 3.25-4.75 ft in depth at the [facility]. Temp of water was 92.  Pt entered/exited the pool via stairs (step to) independently with bilat rail.   Warm up: sidestepping, retro walking, fwd walking, hip circles- in 90f 8" water - marching forward / backwards  - plank with hands on bench with hip ext x 10 ( some spasming in low back)  -L stretch at stairs 30s hold; fig 4 stretch x 2 reps - walking lunges forward (some pain with Rt hip ext) - Holding wall: single leg clams; hip openers (Rt groin pain)  - hip flexor stretch R/L/R x 20 s  - suspended on yellow noodle: cycling, cc ski, hip abdct/add -lumbar rotation with UE on yellow noodle - hamstring stretch with PF/DF of foot (on 2nd step) R/L/R  - L stretch  Pt requires buoyancy for support and to offload joints with strengthening exercises. Viscosity of the water is needed for resistance of strengthening; water current perturbations provides challenge to standing balance unsupported, requiring increased core activation.     PATIENT EDUCATION:  Education details:  aquatics progressions Person educated: Patient Education method: EEducation officer, environmental TCorporate treasurercues, Verbal cues,  Education comprehension: verbalized understanding, returned demonstration, verbal cues required, and tactile cues required   HOME EXERCISE PROGRAM: Access Code: [email or web address] URL:  [email or web address] Date: 07/09/2022 Prepared by: ADaleen Bo ASSESSMENT:  CLINICAL IMPRESSION: Pt had some reports of back spasming throughout session; relieved with lumbar stretches.  Some Rt groin pain with hip abdct/knee flexion; relieved with time and suspended LE.  Overall pain level remained 2-4/ 10, depending on exercises. Overall, reporting improve mobility outside of therapy sessions.  Pt normally has afternoon therapy appts; will assess how she responded to early morning sessions at next visit. Progressing gradually towards goals.     OBJECTIVE IMPAIRMENTS Abnormal gait, decreased activity tolerance, decreased balance, decreased endurance, decreased mobility, difficulty walking, decreased ROM, decreased strength, hypomobility, increased fascial restrictions, increased muscle spasms, impaired flexibility, improper body mechanics, postural dysfunction, and pain.   ACTIVITY LIMITATIONS carrying, lifting, bending, sitting, standing, squatting, stairs, transfers, and locomotion level  PARTICIPATION LIMITATIONS: cleaning, laundry, interpersonal relationship, driving, shopping, community activity, occupation, and exercise  PERSONAL FACTORS Age, Behavior pattern, Fitness, Past/current experiences, Time since onset of injury/illness/exacerbation, and 1-2 comorbidities:  are also affecting patient's functional outcome.   REHAB POTENTIAL: Fair   CLINICAL DECISION MAKING: Stable/uncomplicated  EVALUATION COMPLEXITY: Low    GOALS:   SHORT TERM GOALS: Target date: 08/20/2022   Pt will become independent with HEP in order to demonstrate synthesis of PT education.   Goal status: Achieved.   2.  Pt will report at least 2 pt reduction on NPRS scale for pain in order to demonstrate functional improvement with household activity, self care, and ADL.   Goal status: ongoing  3.  Pt will be able to demonstrate ability to ascend and descend stairs with step to pattern without  pain in order to demonstrate functional improvement in lumbar function for self-care and house hold duties.   Goal status: ongoing  LONG TERM GOALS: Target date: 10/01/2022   Pt  will become independent with final HEP in order to demonstrate synthesis of PT education.   Goal status: INITIAL  2.  Pt will score >/= 46 on FOTO to demonstrate improvement in perceived lumbar function.   Goal status: INITIAL  3.  Pt will be able to demonstrate/report ability to walk >15 mins without pain in order to demonstrate functional improvement and tolerance to exercise and community mobility.   Goal status: INITIAL  4.  Pt will be able to demonstrate/report ability to sit/stand/sleep for extended periods of time without pain in order to demonstrate functional improvement and tolerance to static positioning.  Goal status: INITIAL  5. Pt will be able to perform 5XSTS in under 12s with or without UE  in order to demonstrate functional improvement above the cut off score for adults.   Goal status: INITIAL     PLAN: PT FREQUENCY: 1-2x/week  PT DURATION: 12 weeks  PLANNED INTERVENTIONS: Therapeutic exercises, Therapeutic activity, Neuromuscular re-education, Balance training, Gait training, Patient/Family education, Self Care, Joint mobilization, Joint manipulation, Stair training, Orthotic/Fit training, DME instructions, Aquatic Therapy, Dry Needling, Electrical stimulation, Spinal manipulation, Spinal mobilization, Cryotherapy, Moist heat, scar mobilization, Splintting, Taping, Vasopneumatic device, Traction, Ultrasound, Ionotophoresis 36m/ml Dexamethasone, Manual therapy, and Re-evaluation  PLAN FOR NEXT SESSION: continue aquatic; R anterior hip/thigh stretching, normalizing gait pattern, lumbopelvic strength and motor control   JKerin Perna PTA 09/02/22 2:16 PM [place] [address]Phone:  [phone]  Fax:  [phone]

## 2022-09-08 NOTE — Therapy (Incomplete)
OUTPATIENT PHYSICAL THERAPY THORACOLUMBAR TREATMENT   Patient Name: [name] MRN: [id number] DOB:03-25-1961, 61 y.o., female Today's Date: 09/08/2022           Past Medical History:  Diagnosis Date   BPPV (benign paroxysmal positional vertigo)    Vestib rehab ref-->07/15/18   Chronic headaches    Chronic low back pain    multilevel facet arthropathy   Chronic renal insufficiency, stage 2 (mild)    borderlined II/III (GF@60 )   Depression    Diabetes mellitus ([place]) 10/2019 prediab; 04/2020 DM 2   Fasting gluc low 120s, Hba1c 6.3%. June 2021 A1c 7%   History of adenomatous polyp of colon 03/09/2014   tubular adenoma 2011    Hyperlipidemia 10/2019   10 yr [place] CV risk= 3.5%->TLC.  LDL 170s + new dx DM 04/2020->pt declines any statin b/c of myalgis on 2 diff ones.   Migraine    NASH (nonalcoholic steatohepatitis) 03/21/2010   Qualifier: Diagnosis of  By: [name] MD, [name]    OSA (obstructive sleep apnea) 10/14/2012   Sleep study completed on 09/22/2012-interpreting physician Clinton D. Young MD  Impression: 1. 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 2. Moderate scarring with oxygen saturations were -87% with mean saturation to the study night at 92% on room air. 3. Regular cardiac rhythm and the heart rate is 60 beats per minute.    RMSF [facility] spotted fever) 2017/18   Lyme neg   Sacroiliac pain    chronic, left (Dr. Othelia Pulling eval 04/2021->SI injection helpful.   Tobacco dependence    switched to vap 2017   Past Surgical History:  Procedure Laterality Date   ABDOMINAL HYSTERECTOMY  2010   [name]   CAROTID DOPPLERS Bilateral 07/24/2020   NORMAL   cataract surg     2021   CHOLECYSTECTOMY N/A 03/10/2014   Procedure: LAPAROSCOPIC CHOLECYSTECTOMY WITH INTRAOPERATIVE CHOLANGIOGRAM;  Surgeon: [name], MD;  Location: [facility];  Service: General;  Laterality: N/A;   COLONOSCOPY W/ BIOPSIES  2011; 03/2014   Dr [name] 2011.  Dr.  Benson Norway 2015 (polyps-->recall 5 yrs).   ESOPHAGOGASTRODUODENOSCOPY  2011   Dr [name]   KNEE ARTHROSCOPY  2015   LAPAROSCOPIC APPENDECTOMY  2009   [name]   RHYTHM MONITORING  07/2020   normal   sij fusion Right 12/23/2021   TEMPOROMANDIBULAR JOINT ARTHROPLASTY     TEMPOROMANDIBULAR JOINT SURGERY     TONSILLECTOMY     TRANSTHORACIC ECHOCARDIOGRAM  08/2020   COMPLETELY NORMAL   Patient Active Problem List   Diagnosis Date Noted   Sun-damaged skin 08/04/2017   Peripheral tear of medial meniscus of left knee as current injury 01/04/2017   Common migraine with intractable migraine 05/11/2016   Non-intractable cyclical vomiting without nausea 08/28/2015   Carbuncle, thigh 12/19/2014   Personal history of colonic polyps 03/09/2014   Nausea and vomiting 03/09/2014   Abdominal distension - diffuse 03/09/2014   Obesity (BMI 30-39.9) 03/09/2014   Tobacco abuse 03/09/2014   Skin lesion of cheek 09/08/2013   Low back pain 08/21/2013   OSA (obstructive sleep apnea) 10/14/2012   Hyperlipidemia 09/07/2012   Chronic fatigue 08/16/2012   Nonspecific elevation of levels of transaminase or lactic acid dehydrogenase (LDH) 08/16/2012   Tobacco use 08/16/2012   NASH (nonalcoholic steatohepatitis) 03/21/2010   IBS (irritable bowel syndrome) 11/26/2009    PCP: Percell Belt, DO  REFERRING PROVIDER: Phylliss Bob, MD  REFERRING DIAG: Low back pain  Rationale for Evaluation and Treatment Rehabilitation  THERAPY DIAG:  No diagnosis found.  ONSET DATE: 2018  SUBJECTIVE:                                                                                                                                                                                           SUBJECTIVE STATEMENT: Pt reports she had ablation last Thurs (6 days ago); hasn't noticed a difference.  She reports she has had a lot of relief with aquatic therapy.     PERTINENT HISTORY:  SIJ  fusion Dec 23 2021, TIA   PAIN:   Are you having pain? Yes: NPRS scale: 3/10  Pain location:R SIJ, deep Pain description: achy Aggravating factors: stairs, sitting too long, standing too long, walking, chores, cooking, showering Relieving factors: heat, meds, ice, leaning on a cart  PRECAUTIONS: None  WEIGHT BEARING RESTRICTIONS No  FALLS:  Has patient fallen in last 6 months? No, but pain was so bad caused her knees to buckle  LIVING ENVIRONMENT: Lives with: lives with their family and lives with their spouse Lives in: House/apartment Stairs: yes Has following equipment at home: Single point cane and Environmental consultant - 4 wheeled  OCCUPATION: [employer]  PLOF: Independent with basic ADLs  PATIENT GOALS : be able to exercise again; pt is acutely aware of recent weight gain and loss of function   OBJECTIVE:   DIAGNOSTIC FINDINGS:  R hip MRI  IMPRESSION: 1. Postsurgical changes related to interval arthrodesis of the right sacroiliac joint. 2. No acute findings or explanation for the patient's symptoms. No significant hip arthropathy or evidence of labral tear.    Disc levels:   T12-L1: No significant disc protrusion, foraminal stenosis, or canal stenosis.   L1-L2: No significant disc protrusion, foraminal stenosis, or canal stenosis.   L2-L3: Slight retrolisthesis of L2 on L3. Small left foraminal disc protrusion without significant canal or foraminal stenosis. No significant change.   L3-L4: Mild disc height loss and desiccation. Mild disc bulging with mild left facet hypertrophy and ligamentum flavum thickening. No significant canal or foraminal stenosis. No significant change.   L4-L5: Mild disc bulging and mild bilateral facet hypertrophy and ligamentum flavum thickening. No significant canal or foraminal stenosis. No significant change.   L5-S1: Bilateral facet arthropathy without significant canal or foraminal stenosis.   IMPRESSION: Similar multilevel degenerative change (detailed above)  without significant canal or foraminal stenosis.  PATIENT SURVEYS:  FOTO 37 46 @ DC     TODAY'S TREATMENT  10/25  Pt seen for aquatic therapy today.  Treatment took place in water 3.25-4.75 ft in depth at the [place]. Temp  of water was 92.  Pt entered/exited the pool via stairs (step to) independently with bilat rail.   Warm up: sidestepping, retro walking, fwd walking, hip circles- in 69f 8" water - marching forward / backwards  - plank with hands on bench with hip ext x 10 ( some spasming in low back)  -L stretch at stairs 30s hold; fig 4 stretch x 2 reps - walking lunges forward (some pain with Rt hip ext) - Holding wall: single leg clams; hip openers (Rt groin pain)  - hip flexor stretch R/L/R x 20 s  - suspended on yellow noodle: cycling, cc ski, hip abdct/add -lumbar rotation with UE on yellow noodle - hamstring stretch with PF/DF of foot (on 2nd step) R/L/R  - L stretch  Pt requires buoyancy for support and to offload joints with strengthening exercises. Viscosity of the water is needed for resistance of strengthening; water current perturbations provides challenge to standing balance unsupported, requiring increased core activation.     PATIENT EDUCATION:  Education details:  aquatics progressions Person educated: Patient Education method: EEducation officer, environmental TCorporate treasurercues, Verbal cues,  Education comprehension: verbalized understanding, returned demonstration, verbal cues required, and tactile cues required   HOME EXERCISE PROGRAM: Access Code: [email or web address] URL: [email or web address] Date: 07/09/2022 Prepared by: ADaleen Bo ASSESSMENT:  CLINICAL IMPRESSION: Pt had some reports of back spasming throughout session; relieved with lumbar stretches.  Some Rt groin pain with hip abdct/knee flexion; relieved with time and suspended LE.  Overall pain level remained 2-4/ 10, depending on exercises. Overall, reporting improve mobility  outside of therapy sessions.  Pt normally has afternoon therapy appts; will assess how she responded to early morning sessions at next visit. Progressing gradually towards goals.     OBJECTIVE IMPAIRMENTS Abnormal gait, decreased activity tolerance, decreased balance, decreased endurance, decreased mobility, difficulty walking, decreased ROM, decreased strength, hypomobility, increased fascial restrictions, increased muscle spasms, impaired flexibility, improper body mechanics, postural dysfunction, and pain.   ACTIVITY LIMITATIONS carrying, lifting, bending, sitting, standing, squatting, stairs, transfers, and locomotion level  PARTICIPATION LIMITATIONS: cleaning, laundry, interpersonal relationship, driving, shopping, community activity, occupation, and exercise  PERSONAL FACTORS Age, Behavior pattern, Fitness, Past/current experiences, Time since onset of injury/illness/exacerbation, and 1-2 comorbidities:  are also affecting patient's functional outcome.   REHAB POTENTIAL: Fair   CLINICAL DECISION MAKING: Stable/uncomplicated  EVALUATION COMPLEXITY: Low    GOALS:   SHORT TERM GOALS: Target date: 08/20/2022   Pt will become independent with HEP in order to demonstrate synthesis of PT education.   Goal status: Achieved.   2.  Pt will report at least 2 pt reduction on NPRS scale for pain in order to demonstrate functional improvement with household activity, self care, and ADL.   Goal status: ongoing  3.  Pt will be able to demonstrate ability to ascend and descend stairs with step to pattern without pain in order to demonstrate functional improvement in lumbar function for self-care and house hold duties.   Goal status: ongoing   LONG TERM GOALS: Target date: 10/01/2022   Pt  will become independent with final HEP in order to demonstrate synthesis of PT education.   Goal status: INITIAL  2.  Pt will score >/= 46 on FOTO to demonstrate improvement in perceived lumbar  function.   Goal status: INITIAL  3.  Pt will be able to demonstrate/report ability to walk >15 mins without pain in order to demonstrate functional improvement and tolerance to exercise and community mobility.   Goal status: INITIAL  4.  Pt will be able to demonstrate/report ability to sit/stand/sleep for extended periods of time without pain in order to demonstrate functional improvement and tolerance to static positioning.  Goal status: INITIAL  5. Pt will be able to perform 5XSTS in under 12s with or without UE  in order to demonstrate functional improvement above the cut off score for adults.   Goal status: INITIAL     PLAN: PT FREQUENCY: 1-2x/week  PT DURATION: 12 weeks  PLANNED INTERVENTIONS: Therapeutic exercises, Therapeutic activity, Neuromuscular re-education, Balance training, Gait training, Patient/Family education, Self Care, Joint mobilization, Joint manipulation, Stair training, Orthotic/Fit training, DME instructions, Aquatic Therapy, Dry Needling, Electrical stimulation, Spinal manipulation, Spinal mobilization, Cryotherapy, Moist heat, scar mobilization, Splintting, Taping, Vasopneumatic device, Traction, Ultrasound, Ionotophoresis 72m/ml Dexamethasone, Manual therapy, and Re-evaluation  PLAN FOR NEXT SESSION: continue aquatic; R anterior hip/thigh stretching, normalizing gait pattern, lumbopelvic strength and motor control   MAnnamarie Major Nakshatra Klose MPT 09/08/22 6:21 PM CFincastle39425 N. James AvenueGFinley NAl[place] [address]Phone: [phone]  Fax:  [phone]

## 2022-09-09 ENCOUNTER — Ambulatory Visit (HOSPITAL_BASED_OUTPATIENT_CLINIC_OR_DEPARTMENT_OTHER): Payer: BC Managed Care – PPO | Admitting: Physical Therapy

## 2022-09-18 ENCOUNTER — Ambulatory Visit (HOSPITAL_BASED_OUTPATIENT_CLINIC_OR_DEPARTMENT_OTHER): Payer: BC Managed Care – PPO | Attending: Orthopedic Surgery | Admitting: Physical Therapy

## 2022-09-18 ENCOUNTER — Encounter (HOSPITAL_BASED_OUTPATIENT_CLINIC_OR_DEPARTMENT_OTHER): Payer: Self-pay | Admitting: Physical Therapy

## 2022-09-18 DIAGNOSIS — M5459 Other low back pain: Secondary | ICD-10-CM | POA: Insufficient documentation

## 2022-09-18 DIAGNOSIS — M6281 Muscle weakness (generalized): Secondary | ICD-10-CM | POA: Diagnosis present

## 2022-09-18 DIAGNOSIS — R262 Difficulty in walking, not elsewhere classified: Secondary | ICD-10-CM | POA: Insufficient documentation

## 2022-09-18 NOTE — Therapy (Addendum)
OUTPATIENT PHYSICAL THERAPY THORACOLUMBAR TREATMENT  PHYSICAL THERAPY DISCHARGE SUMMARY  Visits from Start of Care: 11  Plan: Patient agrees to discharge.  Patient goals were not met. Patient is being discharged due to not returning to therapy.       Patient Name: [name] MRN: [id number] DOB:1961/06/09, 61 y.o., female Today's Date: 09/18/2022   PT End of Session - 09/18/22 1435     Visit Number 11    Number of Visits 21    Date for PT Re-Evaluation 10/07/22    Authorization Type BCBS    PT Start Time 1425    PT Stop Time 1508    PT Time Calculation (min) 43 min    Behavior During Therapy WFL for tasks assessed/performed                   Past Medical History:  Diagnosis Date   BPPV (benign paroxysmal positional vertigo)    Vestib rehab ref-->07/15/18   Chronic headaches    Chronic low back pain    multilevel facet arthropathy   Chronic renal insufficiency, stage 2 (mild)    borderlined II/III (GF@60 )   Depression    Diabetes mellitus ([place]) 10/2019 prediab; 04/2020 DM 2   Fasting gluc low 120s, Hba1c 6.3%. June 2021 A1c 7%   History of adenomatous polyp of colon 03/09/2014   tubular adenoma 2011    Hyperlipidemia 10/2019   10 yr [place] CV risk= 3.5%->TLC.  LDL 170s + new dx DM 04/2020->pt declines any statin b/c of myalgis on 2 diff ones.   Migraine    NASH (nonalcoholic steatohepatitis) 03/21/2010   Qualifier: Diagnosis of  By: [name] MD, [name]    OSA (obstructive sleep apnea) 10/14/2012   Sleep study completed on 09/22/2012-interpreting physician Clinton D. Young MD  Impression: 1. 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 2. Moderate scarring with oxygen saturations were -87% with mean saturation to the study night at 92% on room air. 3. Regular cardiac rhythm and the heart rate is 60 beats per minute.    RMSF [facility] spotted fever) 2017/18   Lyme neg   Sacroiliac pain    chronic, left (Dr. Othelia Pulling eval 04/2021->SI  injection helpful.   Tobacco dependence    switched to vap 2017   Past Surgical History:  Procedure Laterality Date   ABDOMINAL HYSTERECTOMY  2010   [name]   CAROTID DOPPLERS Bilateral 07/24/2020   NORMAL   cataract surg     2021   CHOLECYSTECTOMY N/A 03/10/2014   Procedure: LAPAROSCOPIC CHOLECYSTECTOMY WITH INTRAOPERATIVE CHOLANGIOGRAM;  Surgeon: [name], MD;  Location: [facility];  Service: General;  Laterality: N/A;   COLONOSCOPY W/ BIOPSIES  2011; 03/2014   Dr [name] 2011.  [name] 2015 (polyps-->recall 5 yrs).   ESOPHAGOGASTRODUODENOSCOPY  2011   Dr [name]   KNEE ARTHROSCOPY  2015   LAPAROSCOPIC APPENDECTOMY  2009   [name]   RHYTHM MONITORING  07/2020   normal   sij fusion Right 12/23/2021   TEMPOROMANDIBULAR JOINT ARTHROPLASTY     TEMPOROMANDIBULAR JOINT SURGERY     TONSILLECTOMY     TRANSTHORACIC ECHOCARDIOGRAM  08/2020   COMPLETELY NORMAL   Patient Active Problem List   Diagnosis Date Noted   Sun-damaged skin 08/04/2017   Peripheral tear of medial meniscus of left knee as current injury 01/04/2017   Common migraine with intractable migraine 05/11/2016   Non-intractable cyclical vomiting without nausea 08/28/2015   Carbuncle,  thigh 12/19/2014   Personal history of colonic polyps 03/09/2014   Nausea and vomiting 03/09/2014   Abdominal distension - diffuse 03/09/2014   Obesity (BMI 30-39.9) 03/09/2014   Tobacco abuse 03/09/2014   Skin lesion of cheek 09/08/2013   Low back pain 08/21/2013   OSA (obstructive sleep apnea) 10/14/2012   Hyperlipidemia 09/07/2012   Chronic fatigue 08/16/2012   Nonspecific elevation of levels of transaminase or lactic acid dehydrogenase (LDH) 08/16/2012   Tobacco use 08/16/2012   NASH (nonalcoholic steatohepatitis) 03/21/2010   IBS (irritable bowel syndrome) 11/26/2009    PCP: Percell Belt, DO  REFERRING PROVIDER: Phylliss Bob, MD  REFERRING DIAG: Low back pain  Rationale for Evaluation and Treatment  Rehabilitation  THERAPY DIAG:  Other low back pain  Muscle weakness (generalized)  Difficulty walking  ONSET DATE: 2018  SUBJECTIVE:                                                                                                                                                                                           SUBJECTIVE STATEMENT: Pt reports she was able to go 4 wheeling as well as stand in kitchen up to 1.5 hr.  Still doesn't like going up / down steps due to balance.     PERTINENT HISTORY:  SIJ  fusion Dec 23 2021, TIA   PAIN:  Are you having pain? Yes: NPRS scale: 1/10  Pain location:R SIJ, deep Pain description: achy Aggravating factors: stairs, sitting too long, standing too long, walking, chores, cooking, showering Relieving factors: heat, meds, ice, leaning on a cart  PRECAUTIONS: None  WEIGHT BEARING RESTRICTIONS No  FALLS:  Has patient fallen in last 6 months? No, but pain was so bad caused her knees to buckle  LIVING ENVIRONMENT: Lives with: lives with their family and lives with their spouse Lives in: House/apartment Stairs: yes Has following equipment at home: Single point cane and Environmental consultant - 4 wheeled  OCCUPATION: [employer]  PLOF: Independent with basic ADLs  PATIENT GOALS : be able to exercise again; pt is acutely aware of recent weight gain and loss of function   OBJECTIVE:   DIAGNOSTIC FINDINGS:  R hip MRI  IMPRESSION: 1. Postsurgical changes related to interval arthrodesis of the right sacroiliac joint. 2. No acute findings or explanation for the patient's symptoms. No significant hip arthropathy or evidence of labral tear.    Disc levels:   T12-L1: No significant disc protrusion, foraminal stenosis, or canal stenosis.   L1-L2: No significant disc protrusion, foraminal stenosis, or canal stenosis.   L2-L3: Slight retrolisthesis of L2 on L3. Small left foraminal disc protrusion without significant canal  or foraminal stenosis.  No significant change.   L3-L4: Mild disc height loss and desiccation. Mild disc bulging with mild left facet hypertrophy and ligamentum flavum thickening. No significant canal or foraminal stenosis. No significant change.   L4-L5: Mild disc bulging and mild bilateral facet hypertrophy and ligamentum flavum thickening. No significant canal or foraminal stenosis. No significant change.   L5-S1: Bilateral facet arthropathy without significant canal or foraminal stenosis.   IMPRESSION: Similar multilevel degenerative change (detailed above) without significant canal or foraminal stenosis.  PATIENT SURVEYS:  OVZC 58 85 @ DC 09/18/22- FOTO: 58  Functional Tests:  5x STS: 10.04s without UE  TODAY'S TREATMENT  11/10  Pt seen for aquatic therapy today.  Treatment took place in water 3.25-4.75 ft in depth at the [place]. Temp of water was 92.  Pt entered/exited the pool via stairs (step to) independently with bilat rail.   Warm up: sidestepping, retro walking, fwd walking, in 59f 8" water without support - marching In place   - side stepping with increased step length - walking lunges forward  - suspended on yellow noodle: cycling, cc ski, hip abdct/add - seated on yellow noodle without feet touching, slowly raising one arm - plank with UE on yellow noodle/ balance in push up position, then yellow noodle float up/push down 5 x - hamstring stretch with PF/DF of foot (on 2nd step) R/L/R  -L stretch at stairs 30s hold; fig 4 stretch x 2 reps  Pt requires buoyancy for support and to offload joints with strengthening exercises. Viscosity of the water is needed for resistance of strengthening; water current perturbations provides challenge to standing balance unsupported, requiring increased core activation.     PATIENT EDUCATION:  Education details:  aquatics progressions Person educated: Patient Education method: EEducation officer, environmental TCorporate treasurercues, Verbal  cues,  Education comprehension: verbalized understanding, returned demonstration, verbal cues required, and tactile cues required   HOME EXERCISE PROGRAM: Access Code: [email or web address] URL: [email or web address] Date: 07/09/2022 Prepared by: ADaleen Bo ASSESSMENT:  CLINICAL IMPRESSION: Pt tolerated all exercises well, without any production of pain.  At beginning of session, pt did butterfly stroke across width of pool ( on her own accord) and this did cause some spasms across lower back; eased with LE suspended in deeper water.  Assessed goals during session as pt was uncertain if she wanted to d/c after today's session.  She has had much improvement in symptoms and mobility since starting therapy.  She has partially met her goals.  She may keep one more session in case of flare up of symptoms over weekend / and to finalize HEP.     OBJECTIVE IMPAIRMENTS Abnormal gait, decreased activity tolerance, decreased balance, decreased endurance, decreased mobility, difficulty walking, decreased ROM, decreased strength, hypomobility, increased fascial restrictions, increased muscle spasms, impaired flexibility, improper body mechanics, postural dysfunction, and pain.   ACTIVITY LIMITATIONS carrying, lifting, bending, sitting, standing, squatting, stairs, transfers, and locomotion level  PARTICIPATION LIMITATIONS: cleaning, laundry, interpersonal relationship, driving, shopping, community activity, occupation, and exercise  PERSONAL FACTORS Age, Behavior pattern, Fitness, Past/current experiences, Time since onset of injury/illness/exacerbation, and 1-2 comorbidities:  are also affecting patient's functional outcome.   REHAB POTENTIAL: Fair   CLINICAL DECISION MAKING: Stable/uncomplicated  EVALUATION COMPLEXITY: Low    GOALS:   SHORT TERM GOALS: Target date: 08/20/2022   Pt will become independent with HEP in order to demonstrate synthesis of PT education.   Goal status: Achieved.    2.  Pt will report at least  2 pt reduction on NPRS scale for pain in order to demonstrate functional improvement with household activity, self care, and ADL.   Goal status: Achieved - 10/25  3.  Pt will be able to demonstrate ability to ascend and descend stairs with step to pattern without pain in order to demonstrate functional improvement in lumbar function for self-care and house hold duties.   Goal status: Achieved - 09/18/22   LONG TERM GOALS: Target date: 10/01/2022   Pt  will become independent with final HEP in order to demonstrate synthesis of PT education.   Goal status: Achieved - 09/18/22  2.  Pt will score >/= 46 on FOTO to demonstrate improvement in perceived lumbar function.   Goal status: Achieved - 58 - 09/18/22  3.  Pt will be able to demonstrate/report ability to walk >15 mins without pain in order to demonstrate functional improvement and tolerance to exercise and community mobility.   Goal status: Partially met - but with pain increases to 3/10  4.  Pt will be able to demonstrate/report ability to sit/stand/sleep for extended periods of time without pain in order to demonstrate functional improvement and tolerance to static positioning.  Goal status: Partially met - able to sit/ stand for extended periods, but not able to sleep longer than 1 hr.   5. Pt will be able to perform 5XSTS in under 12s with or without UE  in order to demonstrate functional improvement above the cut off score for adults.   Goal status: Achieved.   10.04 sec 09/18/22     PLAN: PT FREQUENCY: 1-2x/week  PT DURATION: 12 weeks  PLANNED INTERVENTIONS: Therapeutic exercises, Therapeutic activity, Neuromuscular re-education, Balance training, Gait training, Patient/Family education, Self Care, Joint mobilization, Joint manipulation, Stair training, Orthotic/Fit training, DME instructions, Aquatic Therapy, Dry Needling, Electrical stimulation, Spinal manipulation, Spinal mobilization,  Cryotherapy, Moist heat, scar mobilization, Splintting, Taping, Vasopneumatic device, Traction, Ultrasound, Ionotophoresis 82m/ml Dexamethasone, Manual therapy, and Re-evaluation  PLAN FOR NEXT SESSION:  finalize  HEP   JKerin Perna PTA 09/18/22 3:19 PM CPlatinum[address]GNorth [address] [place] [address]Phone: [phone]  Fax:  [phone] ADaleen BoPT, DPT 12/17/22 10:38 AM

## 2022-09-23 ENCOUNTER — Ambulatory Visit (HOSPITAL_BASED_OUTPATIENT_CLINIC_OR_DEPARTMENT_OTHER): Payer: BC Managed Care – PPO | Admitting: Physical Therapy

## 2022-09-29 ENCOUNTER — Ambulatory Visit (HOSPITAL_BASED_OUTPATIENT_CLINIC_OR_DEPARTMENT_OTHER): Payer: BC Managed Care – PPO | Admitting: Physical Therapy

## 2022-10-06 ENCOUNTER — Ambulatory Visit (HOSPITAL_BASED_OUTPATIENT_CLINIC_OR_DEPARTMENT_OTHER): Payer: BC Managed Care – PPO | Admitting: Physical Therapy

## 2023-02-08 ENCOUNTER — Other Ambulatory Visit: Payer: Self-pay

## 2023-02-08 DIAGNOSIS — Z1231 Encounter for screening mammogram for malignant neoplasm of breast: Secondary | ICD-10-CM

## 2023-02-09 ENCOUNTER — Other Ambulatory Visit: Payer: Self-pay | Admitting: Family Medicine

## 2023-02-09 DIAGNOSIS — Z1231 Encounter for screening mammogram for malignant neoplasm of breast: Secondary | ICD-10-CM

## 2023-03-17 ENCOUNTER — Ambulatory Visit
Admission: RE | Admit: 2023-03-17 | Discharge: 2023-03-17 | Disposition: A | Discharge status: Home / Self Care | Payer: BC Managed Care – PPO | Source: Ambulatory Visit | Attending: Family Medicine | Admitting: Family Medicine

## 2023-03-17 DIAGNOSIS — Z1231 Encounter for screening mammogram for malignant neoplasm of breast: Secondary | ICD-10-CM

## 2023-03-22 ENCOUNTER — Other Ambulatory Visit: Payer: Self-pay | Admitting: Family Medicine

## 2023-03-22 DIAGNOSIS — N6320 Unspecified lump in the left breast, unspecified quadrant: Secondary | ICD-10-CM

## 2023-06-01 ENCOUNTER — Encounter: Payer: Self-pay | Admitting: Neurosurgery

## 2023-06-01 ENCOUNTER — Other Ambulatory Visit: Payer: Self-pay | Admitting: Neurosurgery

## 2023-06-01 DIAGNOSIS — M5440 Lumbago with sciatica, unspecified side: Secondary | ICD-10-CM

## 2023-06-01 IMAGING — MR MR SHOULDER*R* W/O CM
5 series · 36 of 40 positions shown · non-contrast
Comparison: None.

CLINICAL DATA: Right shoulder pain for 1 month. Intermittent hand
numbness.

EXAM:
MRI OF THE RIGHT SHOULDER WITHOUT CONTRAST
TECHNIQUE: Multiplanar, multisequence MR imaging of the shoulder was performed.
No intravenous contrast was administered.

[Series 4: T2 fat-sat · axial · 4.0mm · 0.55mm/px · z∈[-65,+41]mm · 8 of 25 slices shown (1 of 3)]
[im 1/25]
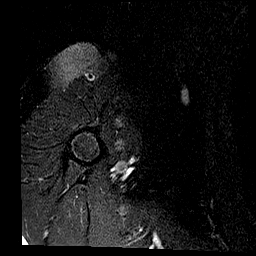
[im 4/25]
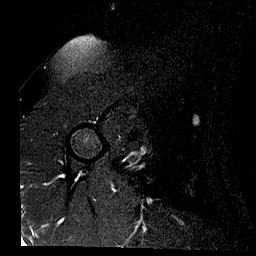
[im 7/25]
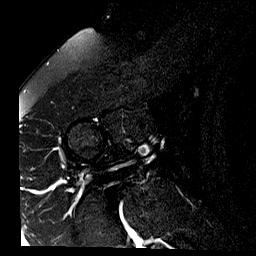
[im 11/25]
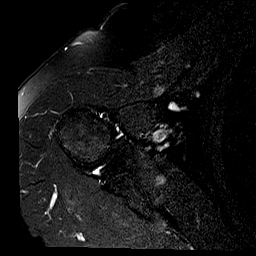
[im 14/25]
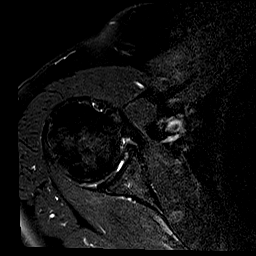
[im 18/25]
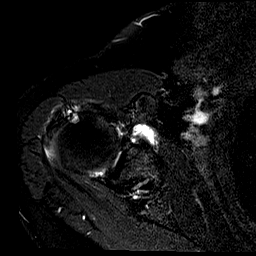
[im 21/25]
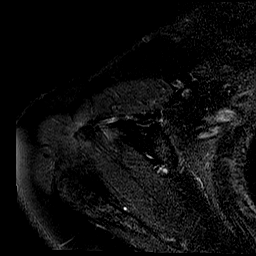
[im 25/25]
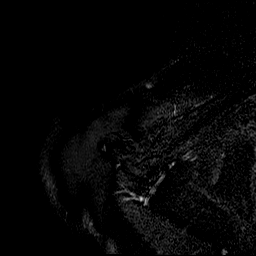

[Series 5: T2 fat-sat · oblique · 4.0mm · 0.59mm/px · 7 of 21 slices shown (2 of 3)]
[im 1/21]
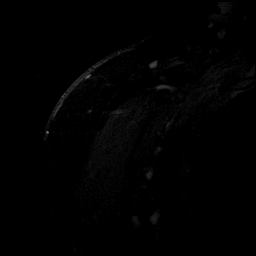
[im 4/21]
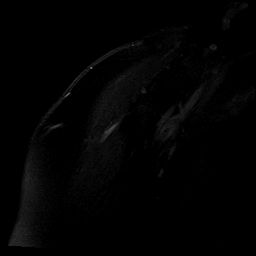
[im 7/21]
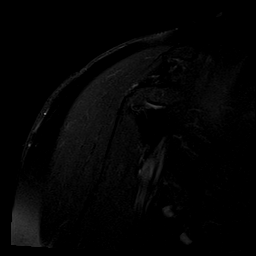
[im 11/21]
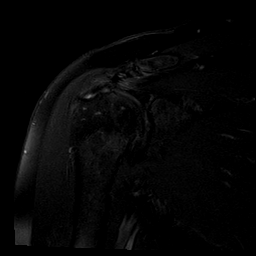
[im 14/21]
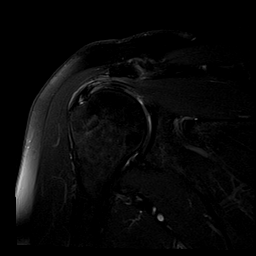
[im 17/21]
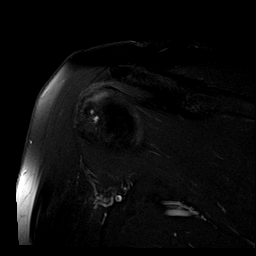
[im 21/21]
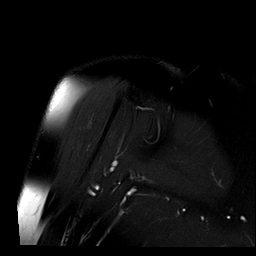

[Series 6: PD · oblique · 4.0mm · 0.29mm/px · 7 of 21 slices shown]
[im 1/21]
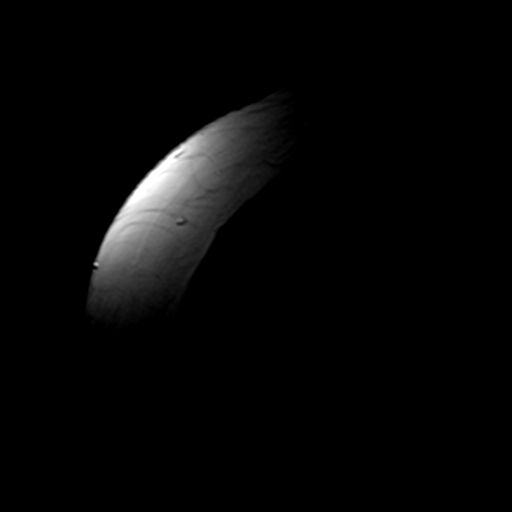
[im 4/21]
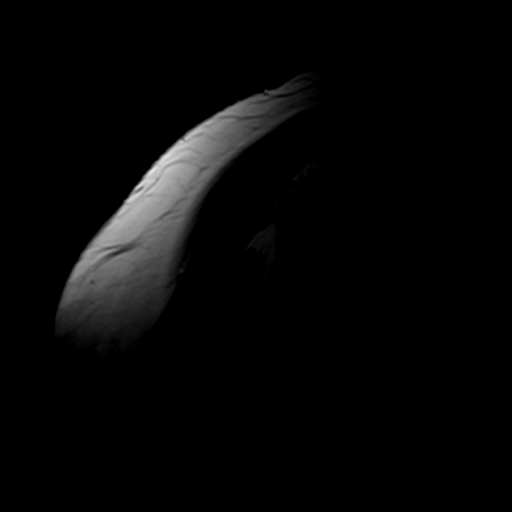
[im 7/21]
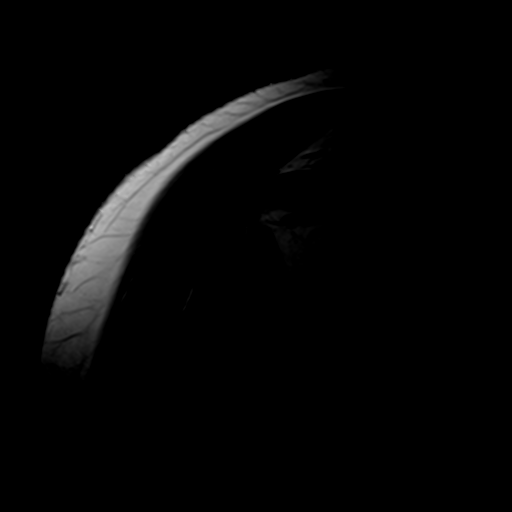
[im 11/21]
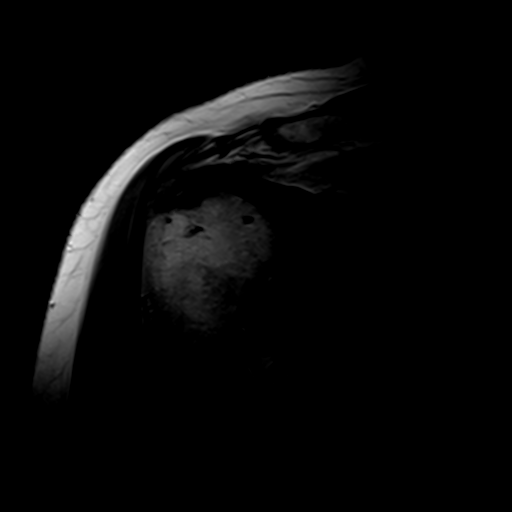
[im 14/21]
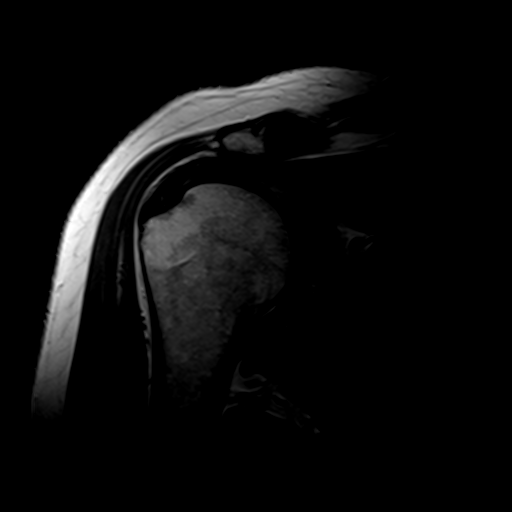
[im 17/21]
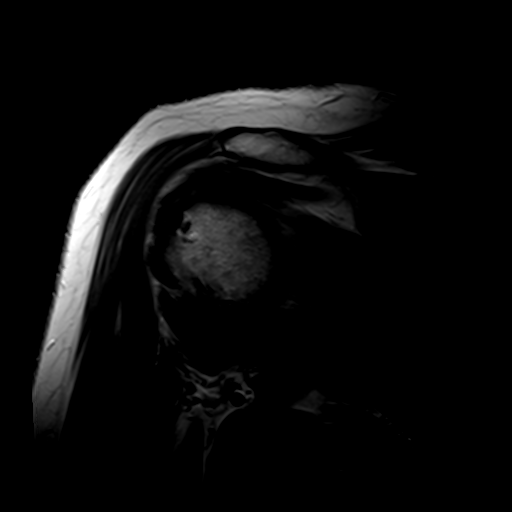
[im 21/21]
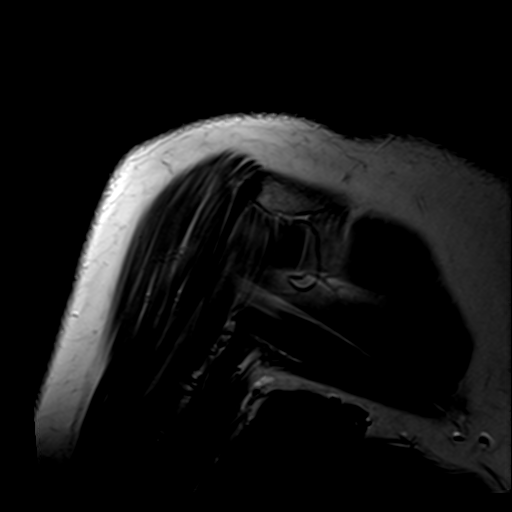

[Series 7: T2 fat-sat · oblique · 4.0mm · 0.59mm/px · 9 of 25 slices shown (3 of 3)]
[im 1/25]
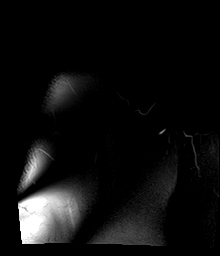
[im 4/25]
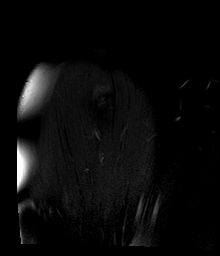
[im 7/25]
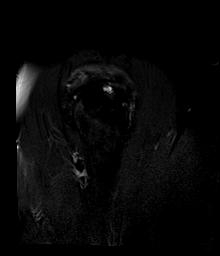
[im 10/25]
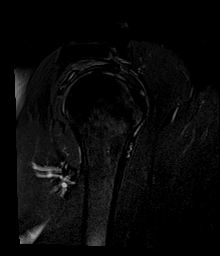
[im 13/25]
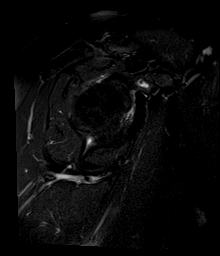
[im 16/25]
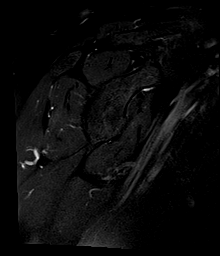
[im 19/25]
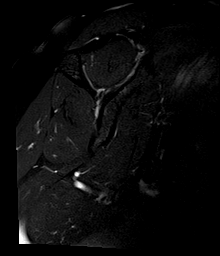
[im 22/25]
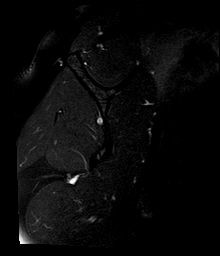
[im 25/25]
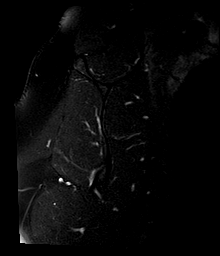

[Series 8: T1 · oblique · 4.0mm · 0.29mm/px · 5 of 25 slices shown]
[im 1/25]
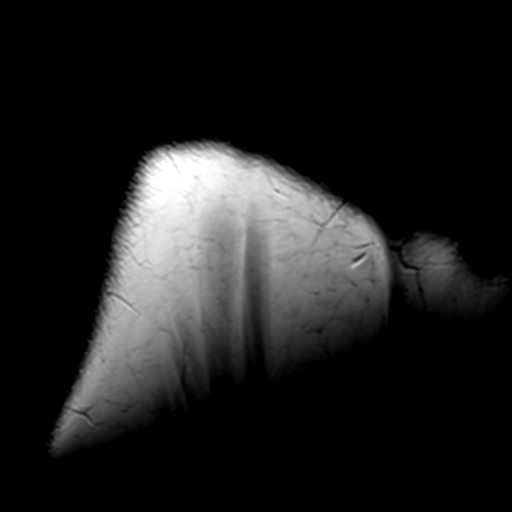
[im 4/25]
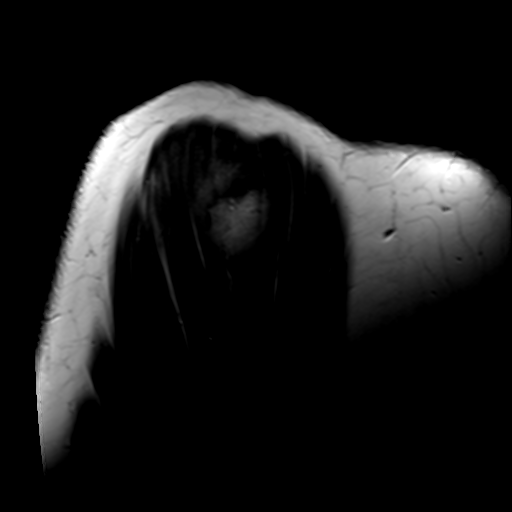
[im 7/25]
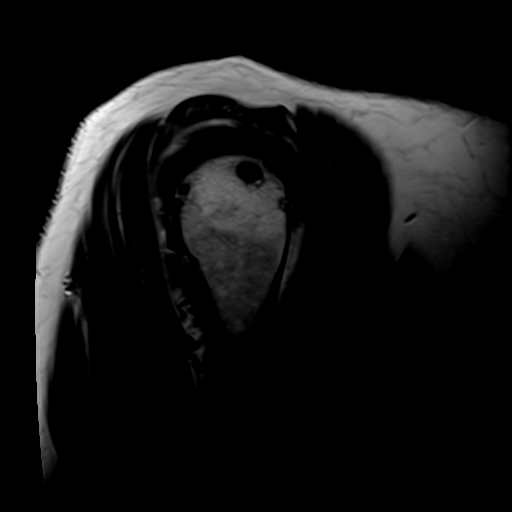
[im 10/25]
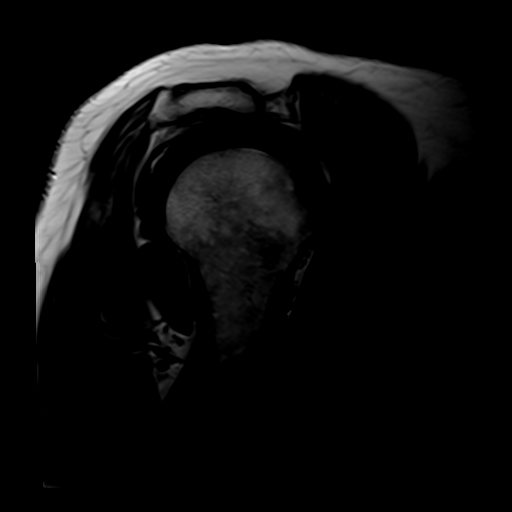
[im 16/25]
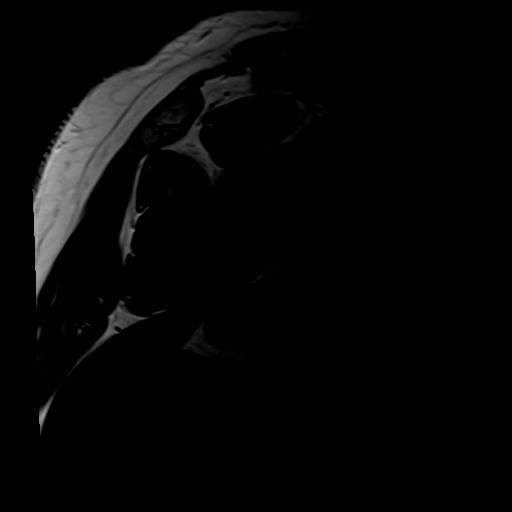

[36 of 40 positions shown; findings below may reference images not displayed]

FINDINGS: Rotator cuff: Moderate to prominent supraspinatus tendinopathy with
scattered intrasubstance partial thickness tearing but no
full-thickness tear identified. Moderate infraspinatus tendinopathy
with intrasubstance partial tearing, image 18 series 7.

Muscles:  Unremarkable

Biceps long head:  Unremarkable

Acromioclavicular Joint: Mild spurring and mild subcortical marrow
edema compatible with mild degenerative arthropathy. Type III
acromion. Trace subacromial subdeltoid bursitis.

Glenohumeral Joint: Mild to moderate degenerative chondral thinning.

Labrum:  Grossly unremarkable

Bones: No significant extra-articular osseous abnormalities
identified.

Other: No supplemental non-categorized findings.
IMPRESSION: 1. Moderate to prominent supraspinatus tendinopathy with moderate
infraspinatus tendinopathy. Partial thickness intrasubstance tearing
in the supraspinatus and infraspinatus tendons. No full-thickness
tear identified.
2. Mild degenerative AC joint arthropathy and mild to moderate
degenerative glenohumeral arthropathy.
3. Trace subacromial subdeltoid bursitis. Mildly unfavorable
subacromial morphology.

## 2023-06-10 ENCOUNTER — Ambulatory Visit
Admission: RE | Admit: 2023-06-10 | Discharge: 2023-06-10 | Disposition: A | Discharge status: Home / Self Care | Payer: BC Managed Care – PPO | Source: Ambulatory Visit | Attending: Neurosurgery | Admitting: Neurosurgery

## 2023-06-10 DIAGNOSIS — M5440 Lumbago with sciatica, unspecified side: Secondary | ICD-10-CM

## 2023-08-11 ENCOUNTER — Encounter: Payer: Self-pay | Admitting: Family Medicine

## 2023-09-21 ENCOUNTER — Other Ambulatory Visit: Payer: Self-pay | Admitting: Family Medicine

## 2023-09-21 ENCOUNTER — Encounter: Payer: Self-pay | Admitting: Family Medicine

## 2023-09-21 ENCOUNTER — Ambulatory Visit
Admission: RE | Admit: 2023-09-21 | Discharge: 2023-09-21 | Disposition: A | Discharge status: Home / Self Care | Payer: BC Managed Care – PPO | Source: Ambulatory Visit | Attending: Family Medicine | Admitting: Family Medicine

## 2023-09-21 DIAGNOSIS — N6320 Unspecified lump in the left breast, unspecified quadrant: Secondary | ICD-10-CM

## 2023-11-16 ENCOUNTER — Other Ambulatory Visit: Payer: Self-pay | Admitting: Gastroenterology

## 2024-03-21 ENCOUNTER — Ambulatory Visit
Admission: RE | Admit: 2024-03-21 | Discharge: 2024-03-21 | Disposition: A | Discharge status: Home / Self Care | Payer: BC Managed Care – PPO | Source: Ambulatory Visit | Attending: Family Medicine | Admitting: Family Medicine

## 2024-03-21 DIAGNOSIS — N6320 Unspecified lump in the left breast, unspecified quadrant: Secondary | ICD-10-CM

## 2024-04-12 ENCOUNTER — Encounter (HOSPITAL_COMMUNITY): Admission: RE | Payer: Self-pay | Source: Home / Self Care

## 2024-04-12 ENCOUNTER — Ambulatory Visit (HOSPITAL_COMMUNITY)
Admission: RE | Admit: 2024-04-12 | Payer: BC Managed Care – PPO | Source: Home / Self Care | Admitting: Gastroenterology

## 2024-04-12 SURGERY — MANOMETRY, ESOPHAGUS

## 2024-05-30 ENCOUNTER — Ambulatory Visit: Admitting: Neurology

## 2024-05-30 ENCOUNTER — Encounter: Payer: Self-pay | Admitting: Neurology

## 2024-05-30 VITALS — BP 114/84 | HR 77 | Ht 65.0 in | Wt 181.0 lb

## 2024-05-30 DIAGNOSIS — G43709 Chronic migraine without aura, not intractable, without status migrainosus: Secondary | ICD-10-CM

## 2024-05-30 DIAGNOSIS — R519 Headache, unspecified: Secondary | ICD-10-CM

## 2024-05-30 DIAGNOSIS — H539 Unspecified visual disturbance: Secondary | ICD-10-CM | POA: Diagnosis not present

## 2024-05-30 DIAGNOSIS — G43109 Migraine with aura, not intractable, without status migrainosus: Secondary | ICD-10-CM | POA: Diagnosis not present

## 2024-05-30 NOTE — Patient Instructions (Addendum)
 MRI of the brain w/wo contrast open MRI Xanax   Start Ajovy  for prevention Take nurtec for 7-14 days once daily to bridge  Fremanezumab  Injection What is this medication? FREMANEZUMAB  (fre ma NEZ ue mab) prevents migraines. It works by blocking a substance in the body that causes migraines. It is a monoclonal antibody. This medicine may be used for other purposes; ask your health care provider or pharmacist if you have questions. COMMON BRAND NAME(S): AJOVY  What should I tell my care team before I take this medication? They need to know if you have any of these conditions: An unusual or allergic reaction to fremanezumab , other medications, foods, dyes, or preservatives Pregnant or trying to get pregnant Breast-feeding How should I use this medication? This medication is injected under the skin. You will be taught how to prepare and give it. Take it as directed on the prescription label. Keep taking it unless your care team tells you to stop. It is important that you put your used needles and syringes in a special sharps container. Do not put them in a trash can. If you do not have a sharps container, call your pharmacist or care team to get one. Talk to your care team about the use of this medication in children. Special care may be needed. Overdosage: If you think you have taken too much of this medicine contact a poison control center or emergency room at once. NOTE: This medicine is only for you. Do not share this medicine with others. What if I miss a dose? If you miss a dose, take it as soon as you can. If it is almost time for your next dose, take only that dose. Do not take double or extra doses. What may interact with this medication? Interactions are not expected. This list may not describe all possible interactions. Give your health care provider a list of all the medicines, herbs, non-prescription drugs, or dietary supplements you use. Also tell them if you smoke, drink alcohol,  or use illegal drugs. Some items may interact with your medicine. What should I watch for while using this medication? Tell your care team if your symptoms do not start to get better or if they get worse. What side effects may I notice from receiving this medication? Side effects that you should report to your care team as soon as possible: Allergic reactions or angioedema--skin rash, itching or hives, swelling of the face, eyes, lips, tongue, arms, or legs, trouble swallowing or breathing Side effects that usually do not require medical attention (report to your care team if they continue or are bothersome): Pain, redness, or irritation at injection site This list may not describe all possible side effects. Call your doctor for medical advice about side effects. You may report side effects to FDA at 1-800-FDA-1088. Where should I keep my medication? Keep out of the reach of children and pets. Store in a refrigerator or at room temperature between 20 and 25 degrees C (68 and 77 degrees F). Refrigeration (preferred): Store in the refrigerator. Do not freeze. Keep in the original container until you are ready to take it. Remove the dose from the carton about 30 minutes before it is time for you to use it. If the dose is not used, it may be stored in the original container at room temperature for 7 days. Get rid of any unused medication after the expiration date. Room Temperature: This medication may be stored at room temperature for up to 7 days. Keep it  in the original container. Protect from light until time of use. If it is stored at room temperature, get rid of any unused medication after 7 days or after it expires, whichever is first. To get rid of medications that are no longer needed or have expired: Take the medication to a medication take-back program. Check with your pharmacy or law enforcement to find a location. If you cannot return the medication, ask your pharmacist or care team how to  get rid of this medication safely. NOTE: This sheet is a summary. It may not cover all possible information. If you have questions about this medicine, talk to your doctor, pharmacist, or health care provider.  2024 Elsevier/Gold Standard (2021-12-19 00:00:00)Rimegepant Disintegrating Tablets What is this medication? RIMEGEPANT (ri ME je pant) prevents and treats migraines. It works by blocking a substance in the body that causes migraines. This medicine may be used for other purposes; ask your health care provider or pharmacist if you have questions. COMMON BRAND NAME(S): NURTEC ODT What should I tell my care team before I take this medication? They need to know if you have any of these conditions: Kidney disease Liver disease An unusual or allergic reaction to rimegepant, other medications, foods, dyes, or preservatives Pregnant or trying to get pregnant Breast-feeding How should I use this medication? Take this medication by mouth. Take it as directed on the prescription label. Leave the tablet in the sealed pack until you are ready to take it. With dry hands, open the pack and gently remove the tablet. If the tablet breaks or crumbles, throw it away. Use a new tablet. Place the tablet in the mouth and allow it to dissolve. Then, swallow it. Do not cut, crush, or chew this medication. You do not need water to take this medication. Talk to your care team about the use of this medication in children. Special care may be needed. Overdosage: If you think you have taken too much of this medicine contact a poison control center or emergency room at once. NOTE: This medicine is only for you. Do not share this medicine with others. What if I miss a dose? This does not apply. This medication is not for regular use. What may interact with this medication? Certain medications for fungal infections, such as fluconazole, itraconazole Rifampin This list may not describe all possible interactions. Give  your health care provider a list of all the medicines, herbs, non-prescription drugs, or dietary supplements you use. Also tell them if you smoke, drink alcohol, or use illegal drugs. Some items may interact with your medicine. What should I watch for while using this medication? Visit your care team for regular checks on your progress. Tell your care team if your symptoms do not start to get better or if they get worse. What side effects may I notice from receiving this medication? Side effects that you should report to your care team as soon as possible: Allergic reactions--skin rash, itching, hives, swelling of the face, lips, tongue, or throat Side effects that usually do not require medical attention (report to your care team if they continue or are bothersome): Nausea Stomach pain This list may not describe all possible side effects. Call your doctor for medical advice about side effects. You may report side effects to FDA at 1-800-FDA-1088. Where should I keep my medication? Keep out of the reach of children and pets. Store at room temperature between 20 and 25 degrees C (68 and 77 degrees F). Get rid of any  unused medication after the expiration date. To get rid of medications that are no longer needed or have expired: Take the medication to a medication take-back program. Check with your pharmacy or law enforcement to find a location. If you cannot return the medication, check the label or package insert to see if the medication should be thrown out in the garbage or flushed down the toilet. If you are not sure, ask your care team. If it is safe to put it in the trash, take the medication out of the container. Mix the medication with cat litter, dirt, coffee grounds, or other unwanted substance. Seal the mixture in a bag or container. Put it in the trash. NOTE: This sheet is a summary. It may not cover all possible information. If you have questions about this medicine, talk to your doctor,  pharmacist, or health care provider.  2024 [employer]vier/Gold Standard (2021-12-17 00:00:00)

## 2024-05-30 NOTE — Progress Notes (Unsigned)
 [facility]    Provider:  Dr Ines Requesting Provider: Lazoff, Shawn P, DO Primary Care Provider:  Lazoff, Shawn P, DO  CC:  Migraines  HPI:  [name] is a 63 y.o. female here as requested by Lazoff, Shawn P, DO for migraines. has NASH (nonalcoholic steatohepatitis); IBS (irritable bowel syndrome); Chronic fatigue; Nonspecific elevation of levels of transaminase or lactic acid dehydrogenase (LDH); Tobacco use; Hyperlipidemia; OSA (obstructive sleep apnea); Low back pain; Skin lesion of cheek; History of colonic polyps; Nausea and vomiting; Abdominal distension - diffuse; Obesity (BMI 30-39.9); Tobacco abuse; Carbuncle, thigh; Common migraine with intractable migraine; Non-intractable cyclical vomiting without nausea; Peripheral tear of medial meniscus of left knee as current injury; Sun-damaged skin; Chronic migraine without aura without status migrainosus, not intractable; and Migraine with aura and without status migrainosus, not intractable on their problem list.   Started decades ago. She started having kaleidoscope eyes, very colorful, then she had cataract surgery and she had a bad fall onto a cement floor and had a concussion and she had laser surger in the eyes but now the kaleidoscope are clear all the lines and swirling starts in one eye the right eye and then to the left, associated with migraine head pain can be behind the eye on the right or in the back right of the head radiating, vomiting, nausea, pulsting/pounding/throbbing, light/sound/smell sensitivity, the kaleidoscopes go away and have migraine moderate to severe, 24 hours to 6 weeks, worsening last few months in freq and severity, maxalt  and ondansetron  help.For > 3 months she has daily headaches and > 15 moderate to severe migraine days a month. An ice cap helps. A dark room helps. Hurts to move, hurts to work   Reviewed notes, labs and imaging from outside physicians, which showed:  From a thorough  review of records and patient report, Medications tried that can be used in migraine/headache management greater than 3 months include: Lifestyle modification, headache diaries, better sleep hygiene, exercise, management of migraine triggers, OTC and prescribed analgesics/nsaids such as ibuprofen, excedrin, alleve and others, propranolol contraindicated due to hypotension and has had asthma (was on albuterol ), flexeril . Robaxin, reglan , topiramate , maxalt , imitrex, ondansetron , amitriptyline/nortriptyline.    Mri brain 2022:reviewed images and agree    IMPRESSION: Unremarkable MRI scan of the brain without contrast showing tiny nonspecific periventricular subcortical white matter hyperintensities with a differential discussed above.  Overall no significant change compared with previous MRI from 09/13/2019.   09/13/2019: MRi brain and orbits: eviewed images and agree     IMPRESSION: 1. No evidence of acute intracranial abnormality. 2. Scattered signal changes within the cerebral white matter are somewhat advanced for age and nonspecific, but likely reflect chronic small vessel ischemic disease. 3. Small left mastoid effusion. 4. Please refer to MRI of the orbits separately reported.  IMPRESSION: Mild symmetric prominence of the optic nerve sheaths of uncertain clinical significance.   Otherwise unremarkable MRI of the orbits.  Labs: I reviewed lab work from May 11, 2024 CMP was unremarkable except for elevated glucose, CBC normal, in April 2025 vitamin D was decreased at 27.9, hemoglobin A1c 6.9 consistent with diabetes,  Review of Systems: Patient complains of symptoms per HPI as well as the following symptoms none. Pertinent negatives and positives per HPI. All others negative.   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    Comment: [employer]  Tobacco Use   Smoking status: Former    Current packs/day: 0.00    Average packs/day: 1.5 packs/day for 32.0 years (48.0 ttl pk-yrs)    Types: Cigarettes    Start date: 08/01/1984    Quit date: 08/01/2016    Years since quitting: 7.8   Smokeless tobacco: Never   Tobacco comments:    Started using nicotine -free vape and added patch  Vaping Use   Vaping status: Every Day   Start date: 04/09/2016   Substances: Flavoring  Substance and Sexual Activity   Alcohol use: Yes    Comment: occassional, once a year   Drug use: No   Sexual activity: Yes    Birth control/protection: Surgical  Other Topics Concern   Not on file  Social History Narrative   Lives with husband   Right Handed   Drinks average 10 cups/day coke zero   Social Drivers of Health   Financial Resource Strain: Not on file  Food Insecurity: Low Risk  (02/18/2024)   Received from [facility]   Hunger Vital Sign    Within the past 12 months, you worried that your food would run out before you got money to buy more: Never true    Within the past 12 months, the food you bought just didn't last and you didn't have money to get more. : Never true  Transportation Needs: No Transportation Needs (02/18/2024)   Received from [facility]    In the past 12 months, has lack of reliable transportation kept you from medical appointments, meetings, work or from getting things needed for daily living? : No  Physical Activity: Not on file  Stress: Not on file (09/13/2023)  Social Connections: Unknown (03/20/2022)   Received from [facility]   Social Network    Social Network: Not on file  Intimate Partner Violence: Not At Risk (07/25/2023)   Received from [facility]   HITS    Over the last 12 months how often did your partner physically hurt you?: Never    Over the last 12 months how often did your partner  insult you or talk down to you?: Never    Over the last 12 months how often did your partner threaten you with physical harm?: Never    Over the last 12 months how often did your partner scream or curse at you?: Never    Family History  Problem Relation Age of Onset   Diabetes Mother    Heart disease Father    Hypertension Father    Hemochromatosis Father    Diabetes Brother    Alcohol abuse Maternal Grandfather    Hyperlipidemia Other    Arthritis Other    Sudden death Other    Migraines Neg Hx     Past Medical History:  Diagnosis Date   BPPV (benign paroxysmal positional vertigo)    Vestib rehab ref-->07/15/18   Chronic headaches    Chronic low back pain    multilevel facet arthropathy   Chronic renal insufficiency, stage 2 (mild)    borderlined II/III (GF@59 )   Depression    pt states she has never had depression   Diabetes mellitus (HCC) 10/2019 prediab; 04/2020 DM 2   Fasting gluc low 120s, Hba1c 6.3%. June 2021 A1c 7%   History of adenomatous polyp of colon 03/09/2014  tubular adenoma 2011    Hyperlipidemia 10/2019   10 yr [place] CV risk= 3.5%->[facility]LC.  LDL 170s + new dx DM 04/2020->pt declines any statin b/c of myalgis on 2 diff ones.   Migraine    NASH (nonalcoholic steatohepatitis) 03/21/2010   Qualifier: Diagnosis of  By: Teressa MD, Toribio SQUIBB    OSA (obstructive sleep apnea) 10/14/2012   Sleep study completed on 09/22/2012-interpreting physician Clinton D. Young MD  Impression: 1. 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 2. Moderate scarring with oxygen saturations were -87% with mean saturation to the study night at 92% on room air. 3. Regular cardiac rhythm and the heart rate is 60 beats per minute.    RMS[facility] spotted fever) 2017/18   Lyme neg   Sacroiliac pain    chronic, left (Dr. Cala eval 04/2021->SI injection helpful.   Tobacco dependence    switched to vap 2017    Patient Active Problem List   Diagnosis Date Noted    Chronic migraine without aura without status migrainosus, not intractable 05/31/2024   Migraine with aura and without status migrainosus, not intractable 05/31/2024   Sun-damaged skin 08/04/2017   Peripheral tear of medial meniscus of left knee as current injury 01/04/2017   Common migraine with intractable migraine 05/11/2016   Non-intractable cyclical vomiting without nausea 08/28/2015   Carbuncle, thigh 12/19/2014   History of colonic polyps 03/09/2014   Nausea and vomiting 03/09/2014   Abdominal distension - diffuse 03/09/2014   Obesity (BMI 30-39.9) 03/09/2014   Tobacco abuse 03/09/2014   Skin lesion of cheek 09/08/2013   Low back pain 08/21/2013   OSA (obstructive sleep apnea) 10/14/2012   Hyperlipidemia 09/07/2012   Chronic fatigue 08/16/2012   Nonspecific elevation of levels of transaminase or lactic acid dehydrogenase (LDH) 08/16/2012   Tobacco use 08/16/2012   NASH (nonalcoholic steatohepatitis) 03/21/2010   IBS (irritable bowel syndrome) 11/26/2009    Past Surgical History:  Procedure Laterality Date   ABDOMINAL HYSTERECTOMY  2010   Dr JINNY Skill   CAROTID DOPPLERS Bilateral 07/24/2020   NORMAL   cataract surg     2021   CHOLECYSTECTOMY N/A 03/10/2014   Procedure: LAPAROSCOPIC CHOLECYSTECTOMY WITH INTRAOPERATIVE CHOLANGIOGRAM;  Surgeon: Krystal CHRISTELLA Spinner, MD;  Location: [facility];  Service: General;  Laterality: N/A;   COLONOSCOPY W/ BIOPSIES  2011; 03/2014   Dr Teressa 2011.  Dr. Rollin 2015 (polyps-->recall 5 yrs).   ESOPHAGOGASTRODUODENOSCOPY  2011   Dr Teressa   KNEE ARTHROSCOPY  2015   LAPAROSCOPIC APPENDECTOMY  2009   Dr KATHEE Hummer   lumbar injections     low back, started after SIJ fusion didn't help all symptoms   RHYTHM MONITORING  07/2020   normal   sij fusion Right 12/23/2021   TEMPOROMANDIBULAR JOINT ARTHROPLASTY     TEMPOROMANDIBULAR JOINT SURGERY     TONSILLECTOMY     TRANSTHORACIC ECHOCARDIOGRAM  08/2020   COMPLETELY NORMAL    Current Outpatient  Medications  Medication Sig Dispense Refill   ALPRAZolam  (XANAX ) 0.25 MG tablet Take 1-2 tabs (0.25mg -0.50mg ) 30-60 minutes before procedure. May repeat if needed.Do not drive. 4 tablet 0   Fremanezumab -vfrm (AJOVY ) 225 MG/1.5ML SOAJ Inject 225 mg into the skin every 30 (thirty) days. Please run copay card: BIN# [id number] PCN# [id number] GRP# [id number] ID# [id number] EXP 11/08/2024 1.5 mL 11   nicotine  (NICODERM CQ  - DOSED IN MG/24 HOURS) 21 mg/24hr patch Place 21 mg onto the skin daily.     ondansetron  (ZOFRAN -ODT)  4 MG disintegrating tablet Take 4 mg by mouth every 8 (eight) hours as needed.     Rimegepant Sulfate (NURTEC) 75 MG TBDP Take 1 tablet (75 mg total) by mouth daily as needed. For migraines. Take as close to onset of migraine as possible. One daily maximum. 14 tablet 0   rizatriptan  (MAXALT ) 10 MG tablet Take 10 mg by mouth.     rosuvastatin  (CRESTOR ) 20 MG tablet Take 20 mg by mouth daily.     topiramate  (TOPAMAX ) 25 MG tablet Take 25 mg by mouth 2 (two) times daily.     No current facility-administered medications for this visit.    Allergies as of 05/30/2024 - Review Complete 05/30/2024  Allergen Reaction Noted   Amoxicillin-pot clavulanate Hives, Rash, Other (See Comments), and Hypertension    Bee venom Swelling 05/04/2016   Shellfish allergy Swelling 03/09/2014    Vitals: BP 114/84 (BP Location: Right Arm, Patient Position: Sitting)   Pulse 77   Ht 5' 5 (1.651 m)   Wt 181 lb (82.1 kg)   SpO2 94%   BMI 30.12 kg/m  Last Weight:  Wt Readings from Last 1 Encounters:  05/30/24 181 lb (82.1 kg)   Last Height:   Ht Readings from Last 1 Encounters:  05/30/24 5' 5 (1.651 m)     Physical exam: Exam: Gen: NAD, conversant, well nourised, obese, well groomed                     CV: RRR, no MRG. No Carotid Bruits. No peripheral edema, warm, nontender Eyes: Conjunctivae clear without exudates or hemorrhage  Neuro: Detailed Neurologic Exam  Speech:    Speech is normal;  fluent and spontaneous with normal comprehension.  Cognition:    The patient is oriented to person, place, and time;     recent and remote memory intact;     language fluent;     normal attention, concentration,     fund of knowledge Cranial Nerves:    The pupils are equal, round, and reactive to light. The fundi are normal and spontaneous venous pulsations are present. Visual fields are full to finger confrontation. Extraocular movements are intact. Trigeminal sensation is intact and the muscles of mastication are normal. The face is symmetric. The palate elevates in the midline. Hearing intact. Voice is normal. Shoulder shrug is normal. The tongue has normal motion without fasciculations.   Coordination: Normal   Gait: Normal   Motor Observation:    No asymmetry, no atrophy, and no involuntary movements noted. Tone:    Normal muscle tone.    Posture:    Posture is normal. normal erect    Strength:    Strength is V/V in the upper and lower limbs.      Sensation: intact to LT     Reflex Exam:  DTR's:    Deep tendon reflexes in the upper and lower extremities are normal bilaterally.   Toes:    The toes are downgoing bilaterally.   Clonus:    Clonus is absent.    Assessment/Plan:  Patient with chronic migraine, however given concerning worsening will repeat MRI brain  MRI of the brain w/wo contrast open MRI: MRI brain due to concerning symptoms of vision changes, worsening headaches (freq and severity) to look for space occupying mass, chiari or intracranial hypertension (pseudotumor), strokes, malignancies, vasculidities, demyelination(multiple sclerosis) or other  Xanax   Start Ajovy  for prevention Take nurtec for 7-14 days once daily to bridge  Orders Placed This  Encounter  Procedures   MR BRAIN W WO CONTRAST   Meds ordered this encounter  Medications   ALPRAZolam  (XANAX ) 0.25 MG tablet    Sig: Take 1-2 tabs (0.25mg -0.50mg ) 30-60 minutes before procedure. May  repeat if needed.Do not drive.    Dispense:  4 tablet    Refill:  0   Fremanezumab -vfrm (AJOVY ) 225 MG/1.5ML SOAJ    Sig: Inject 225 mg into the skin every 30 (thirty) days. Please run copay card: BIN# [id number] PCN# [id number] GRP# [id number] ID# [id number] EXP 11/08/2024    Dispense:  1.5 mL    Refill:  11    Please run copay card: BIN# [id number] PCN# [id number] GRP# [id number] ID# [id number] EXP 11/08/2024   Rimegepant Sulfate (NURTEC) 75 MG TBDP    Sig: Take 1 tablet (75 mg total) by mouth daily as needed. For migraines. Take as close to onset of migraine as possible. One daily maximum.    Dispense:  14 tablet    Refill:  0    Cc: Lazoff, Shawn P, DO,  Lazoff, Shawn P, DO  Onetha Epp, MD  [facility] [address] [place], [place] [address]  Phone [phone] Fax [phone]

## 2024-05-31 ENCOUNTER — Encounter: Payer: Self-pay | Admitting: Neurology

## 2024-05-31 DIAGNOSIS — G43109 Migraine with aura, not intractable, without status migrainosus: Secondary | ICD-10-CM | POA: Insufficient documentation

## 2024-05-31 DIAGNOSIS — G43709 Chronic migraine without aura, not intractable, without status migrainosus: Secondary | ICD-10-CM | POA: Insufficient documentation

## 2024-05-31 MED ORDER — AJOVY 225 MG/1.5ML ~~LOC~~ SOAJ: 225.0000 mg | SUBCUTANEOUS | 11 refills | Status: DC

## 2024-05-31 MED ORDER — ALPRAZOLAM 0.25 MG PO TABS: ORAL_TABLET | ORAL | 0 refills | Status: AC

## 2024-05-31 MED ORDER — NURTEC 75 MG PO TBDP: 75.0000 mg | ORAL_TABLET | Freq: Every day | ORAL | 0 refills | Status: AC | PRN

## 2024-06-01 ENCOUNTER — Telehealth: Payer: Self-pay | Admitting: Pharmacist

## 2024-06-01 ENCOUNTER — Other Ambulatory Visit (HOSPITAL_COMMUNITY): Payer: Self-pay

## 2024-06-01 NOTE — Telephone Encounter (Signed)
 Pharmacy Patient Advocate Encounter  Received notification from [employer] that Prior Authorization for AJOVY  (fremanezumab -vfrm) injection 225MG /1.5ML auto-injectors has been APPROVED from 05/02/2024 to 06/01/2025   PA #/Case ID/Reference #: [id number]

## 2024-06-07 ENCOUNTER — Telehealth: Payer: Self-pay | Admitting: Neurology

## 2024-06-07 NOTE — Telephone Encounter (Signed)
 CHARON barrows: [id number] exp. 06/07/24-07/06/24 sent to [facility] [phone]

## 2024-06-08 ENCOUNTER — Other Ambulatory Visit: Payer: Self-pay | Admitting: Student

## 2024-06-08 DIAGNOSIS — M47816 Spondylosis without myelopathy or radiculopathy, lumbar region: Secondary | ICD-10-CM

## 2024-06-09 ENCOUNTER — Encounter: Payer: Self-pay | Admitting: Student

## 2024-06-16 ENCOUNTER — Ambulatory Visit
Admission: RE | Admit: 2024-06-16 | Discharge: 2024-06-16 | Disposition: A | Discharge status: Home / Self Care | Source: Ambulatory Visit | Attending: Student | Admitting: Student

## 2024-06-16 DIAGNOSIS — M47816 Spondylosis without myelopathy or radiculopathy, lumbar region: Secondary | ICD-10-CM

## 2024-06-20 ENCOUNTER — Telehealth: Payer: Self-pay | Admitting: Neurology

## 2024-06-20 MED ORDER — NURTEC 75 MG PO TBDP: 75.0000 mg | ORAL_TABLET | ORAL | 5 refills | Status: AC | PRN

## 2024-06-20 NOTE — Addendum Note (Signed)
 Addended by: SHONA SAVANT A on: 06/20/2024 04:08 PM   Modules accepted: Orders

## 2024-06-20 NOTE — Telephone Encounter (Signed)
 Pt reports that the Nurtec has helped, she is out of samples and would like a Rx called into [facility] [phone]

## 2024-06-21 ENCOUNTER — Other Ambulatory Visit (HOSPITAL_COMMUNITY): Payer: Self-pay

## 2024-06-21 NOTE — Telephone Encounter (Signed)
 Pharmacy Patient Advocate Encounter   Received notification from Physician's Office that prior authorization for NURTEC 75MG  TABLET is required/requested.   Insurance verification completed.   The patient is insured through [employer] .   Per test claim: PA required; PA submitted to above mentioned insurance via Latent Key/confirmation #/EOC [id number] Status is pending

## 2024-06-21 NOTE — Telephone Encounter (Signed)
 Pharmacy Patient Advocate Encounter  Received notification from [employer] that Prior Authorization for Nurtec has been APPROVED from 06/21/2024 to 06/21/2025. Unable to obtain price due to refill too soon rejection, last fill date 06/21/2024 next available fill date9/02/2024   PA #/Case ID/Reference #: [id number]

## 2024-06-28 ENCOUNTER — Telehealth: Payer: Self-pay | Admitting: *Deleted

## 2024-06-28 NOTE — Telephone Encounter (Signed)
 Received results of MRI brain 06/19/24 from [facility].   Impression: No acute intracranial abnormality.

## 2024-06-29 NOTE — Telephone Encounter (Signed)
Please let her know- thank you!

## 2024-06-29 NOTE — Telephone Encounter (Signed)
 Called pt and LVM (Ok per [facility]) with MRI brain results. Clarified to pt that MRI is normal for age, per Dr Ines. Mild white matter changes and cerebral volume loss noted. Left office number for pt to call back with any questions.

## 2024-06-29 NOTE — Telephone Encounter (Signed)
 Since MRI of the brain is unremarkable/normal for age, and we started her on Ajovy , I would have her follow up with NP in about 6 months for a med check. Thank you

## 2024-07-03 NOTE — Telephone Encounter (Addendum)
 Spoke to patient gave MRI brain results Pt aware needs a 6 month f/u visit Pt states has to call back a schedule appointment currently in meeting

## 2024-07-19 ENCOUNTER — Other Ambulatory Visit: Payer: Self-pay | Admitting: Student

## 2024-07-19 ENCOUNTER — Encounter: Payer: Self-pay | Admitting: Student

## 2024-07-19 DIAGNOSIS — M25551 Pain in right hip: Secondary | ICD-10-CM

## 2024-07-21 ENCOUNTER — Ambulatory Visit
Admission: RE | Admit: 2024-07-21 | Discharge: 2024-07-21 | Disposition: A | Discharge status: Home / Self Care | Source: Ambulatory Visit | Attending: Student | Admitting: Student

## 2024-07-21 DIAGNOSIS — M25551 Pain in right hip: Secondary | ICD-10-CM

## 2024-08-14 ENCOUNTER — Ambulatory Visit: Admitting: Diagnostic Neuroimaging

## 2024-08-14 ENCOUNTER — Telehealth: Payer: Self-pay | Admitting: Neurology

## 2024-08-14 DIAGNOSIS — G43709 Chronic migraine without aura, not intractable, without status migrainosus: Secondary | ICD-10-CM

## 2024-08-14 MED ORDER — AJOVY 225 MG/1.5ML ~~LOC~~ SOAJ: 225.0000 mg | SUBCUTANEOUS | 11 refills | Status: AC

## 2024-08-14 NOTE — Telephone Encounter (Signed)
 Phone room, please advise pt that refill has been sent but under Dr. Chancy name as he is the work in and Lauraine hasn't seen her yet. Refill sent

## 2024-08-14 NOTE — Telephone Encounter (Signed)
 Pt came in appt thought she had an appt with Dr. Ines scheduled for today 10/6. Saw notes from August that a 6 month f/u needed to be scheduled with NP. Scheduled for next available 03/08/2025 with Sarah slack. Pt wanted to make sure she would have refills of her Ajovy  at [facility] [phone] .

## 2025-03-08 ENCOUNTER — Ambulatory Visit: Admitting: Neurology
